# Patient Record
Sex: Female | Born: 1990 | Race: White | Hispanic: No | Smoking: Never smoker
Health system: Southern US, Community
[De-identification: ages and names within clinical notes are randomized; demographics above are authoritative.]

## PROBLEM LIST (undated history)

## (undated) ENCOUNTER — Inpatient Hospital Stay (HOSPITAL_COMMUNITY): Payer: Self-pay

## (undated) DIAGNOSIS — T8859XA Other complications of anesthesia, initial encounter: Secondary | ICD-10-CM

## (undated) DIAGNOSIS — D649 Anemia, unspecified: Secondary | ICD-10-CM

## (undated) DIAGNOSIS — A539 Syphilis, unspecified: Secondary | ICD-10-CM

## (undated) DIAGNOSIS — K219 Gastro-esophageal reflux disease without esophagitis: Secondary | ICD-10-CM

## (undated) DIAGNOSIS — J189 Pneumonia, unspecified organism: Secondary | ICD-10-CM

## (undated) DIAGNOSIS — N809 Endometriosis, unspecified: Secondary | ICD-10-CM

## (undated) DIAGNOSIS — M419 Scoliosis, unspecified: Secondary | ICD-10-CM

## (undated) DIAGNOSIS — R87629 Unspecified abnormal cytological findings in specimens from vagina: Secondary | ICD-10-CM

## (undated) DIAGNOSIS — G8929 Other chronic pain: Secondary | ICD-10-CM

## (undated) DIAGNOSIS — B999 Unspecified infectious disease: Secondary | ICD-10-CM

## (undated) DIAGNOSIS — R51 Headache: Secondary | ICD-10-CM

## (undated) DIAGNOSIS — F419 Anxiety disorder, unspecified: Secondary | ICD-10-CM

## (undated) DIAGNOSIS — T4145XA Adverse effect of unspecified anesthetic, initial encounter: Secondary | ICD-10-CM

## (undated) DIAGNOSIS — F329 Major depressive disorder, single episode, unspecified: Secondary | ICD-10-CM

## (undated) DIAGNOSIS — T7421XA Adult sexual abuse, confirmed, initial encounter: Secondary | ICD-10-CM

## (undated) DIAGNOSIS — N739 Female pelvic inflammatory disease, unspecified: Secondary | ICD-10-CM

## (undated) DIAGNOSIS — O139 Gestational [pregnancy-induced] hypertension without significant proteinuria, unspecified trimester: Secondary | ICD-10-CM

## (undated) DIAGNOSIS — J45909 Unspecified asthma, uncomplicated: Secondary | ICD-10-CM

## (undated) DIAGNOSIS — N83209 Unspecified ovarian cyst, unspecified side: Secondary | ICD-10-CM

## (undated) DIAGNOSIS — A64 Unspecified sexually transmitted disease: Secondary | ICD-10-CM

## (undated) DIAGNOSIS — N12 Tubulo-interstitial nephritis, not specified as acute or chronic: Secondary | ICD-10-CM

## (undated) DIAGNOSIS — N189 Chronic kidney disease, unspecified: Secondary | ICD-10-CM

## (undated) DIAGNOSIS — Z889 Allergy status to unspecified drugs, medicaments and biological substances status: Secondary | ICD-10-CM

## (undated) DIAGNOSIS — A749 Chlamydial infection, unspecified: Secondary | ICD-10-CM

## (undated) DIAGNOSIS — G43909 Migraine, unspecified, not intractable, without status migrainosus: Secondary | ICD-10-CM

## (undated) DIAGNOSIS — F32A Depression, unspecified: Secondary | ICD-10-CM

## (undated) DIAGNOSIS — M549 Dorsalgia, unspecified: Secondary | ICD-10-CM

## (undated) DIAGNOSIS — E039 Hypothyroidism, unspecified: Secondary | ICD-10-CM

## (undated) DIAGNOSIS — F319 Bipolar disorder, unspecified: Secondary | ICD-10-CM

## (undated) DIAGNOSIS — I10 Essential (primary) hypertension: Secondary | ICD-10-CM

## (undated) DIAGNOSIS — R599 Enlarged lymph nodes, unspecified: Secondary | ICD-10-CM

## (undated) DIAGNOSIS — N3010 Interstitial cystitis (chronic) without hematuria: Secondary | ICD-10-CM

## (undated) HISTORY — DX: Adult sexual abuse, confirmed, initial encounter: T74.21XA

## (undated) HISTORY — DX: Allergy status to unspecified drugs, medicaments and biological substances: Z88.9

## (undated) HISTORY — DX: Unspecified asthma, uncomplicated: J45.909

## (undated) HISTORY — DX: Syphilis, unspecified: A53.9

## (undated) HISTORY — DX: Major depressive disorder, single episode, unspecified: F32.9

## (undated) HISTORY — DX: Adverse effect of unspecified anesthetic, initial encounter: T41.45XA

## (undated) HISTORY — PX: WISDOM TOOTH EXTRACTION: SHX21

## (undated) HISTORY — PX: COLPOSCOPY: SHX161

## (undated) HISTORY — DX: Essential (primary) hypertension: I10

## (undated) HISTORY — DX: Gastro-esophageal reflux disease without esophagitis: K21.9

## (undated) HISTORY — DX: Headache: R51

## (undated) HISTORY — DX: Chlamydial infection, unspecified: A74.9

## (undated) HISTORY — PX: TONSILECTOMY, ADENOIDECTOMY, BILATERAL MYRINGOTOMY AND TUBES: SHX2538

## (undated) HISTORY — DX: Unspecified sexually transmitted disease: A64

## (undated) HISTORY — PX: CRYOTHERAPY: SHX1416

## (undated) HISTORY — DX: Hypothyroidism, unspecified: E03.9

## (undated) HISTORY — DX: Gestational (pregnancy-induced) hypertension without significant proteinuria, unspecified trimester: O13.9

## (undated) HISTORY — DX: Anemia, unspecified: D64.9

## (undated) HISTORY — DX: Scoliosis, unspecified: M41.9

## (undated) HISTORY — DX: Depression, unspecified: F32.A

## (undated) HISTORY — PX: OTHER SURGICAL HISTORY: SHX169

## (undated) HISTORY — DX: Migraine, unspecified, not intractable, without status migrainosus: G43.909

## (undated) HISTORY — DX: Enlarged lymph nodes, unspecified: R59.9

## (undated) HISTORY — DX: Pneumonia, unspecified organism: J18.9

## (undated) HISTORY — DX: Female pelvic inflammatory disease, unspecified: N73.9

## (undated) HISTORY — PX: FRENULECTOMY, LINGUAL: SHX1681

## (undated) HISTORY — DX: Other complications of anesthesia, initial encounter: T88.59XA

---
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
## 2011-01-03 ENCOUNTER — Ambulatory Visit: Admit: 2011-01-03 | Payer: Self-pay | Admitting: Psychologist

## 2011-01-05 ENCOUNTER — Encounter (INDEPENDENT_AMBULATORY_CARE_PROVIDER_SITE_OTHER): Payer: Commercial Managed Care - PPO | Admitting: Psychiatry

## 2011-01-05 DIAGNOSIS — F909 Attention-deficit hyperactivity disorder, unspecified type: Secondary | ICD-10-CM

## 2011-01-05 DIAGNOSIS — F319 Bipolar disorder, unspecified: Secondary | ICD-10-CM

## 2011-01-17 ENCOUNTER — Ambulatory Visit: Payer: Self-pay | Admitting: Psychologist

## 2011-01-31 ENCOUNTER — Ambulatory Visit (INDEPENDENT_AMBULATORY_CARE_PROVIDER_SITE_OTHER): Payer: Commercial Managed Care - PPO | Admitting: Psychologist

## 2011-01-31 DIAGNOSIS — F3110 Bipolar disorder, current episode manic without psychotic features, unspecified: Secondary | ICD-10-CM

## 2011-02-13 LAB — URINALYSIS, ROUTINE W REFLEX MICROSCOPIC
Bilirubin Urine: NEGATIVE
Glucose, UA: NEGATIVE mg/dL
Ketones, ur: NEGATIVE mg/dL
Nitrite: NEGATIVE
Protein, ur: 100 mg/dL — AB
Specific Gravity, Urine: 1.009 (ref 1.005–1.030)
Urobilinogen, UA: 0.2 mg/dL (ref 0.0–1.0)
pH: 6 (ref 5.0–8.0)

## 2011-02-13 LAB — URINE CULTURE
Colony Count: 6000
Culture  Setup Time: 201112290644

## 2011-02-13 LAB — URINE MICROSCOPIC-ADD ON

## 2011-02-13 LAB — PREGNANCY, URINE: Preg Test, Ur: NEGATIVE

## 2011-02-14 ENCOUNTER — Ambulatory Visit (INDEPENDENT_AMBULATORY_CARE_PROVIDER_SITE_OTHER): Payer: Commercial Managed Care - PPO | Admitting: Psychologist

## 2011-02-14 ENCOUNTER — Ambulatory Visit: Payer: Self-pay | Admitting: Psychologist

## 2011-02-14 DIAGNOSIS — F3110 Bipolar disorder, current episode manic without psychotic features, unspecified: Secondary | ICD-10-CM

## 2011-02-28 ENCOUNTER — Ambulatory Visit (INDEPENDENT_AMBULATORY_CARE_PROVIDER_SITE_OTHER): Payer: Commercial Managed Care - PPO | Admitting: Psychologist

## 2011-02-28 DIAGNOSIS — F3110 Bipolar disorder, current episode manic without psychotic features, unspecified: Secondary | ICD-10-CM

## 2011-03-11 LAB — POCT URINALYSIS DIP (DEVICE)
Bilirubin Urine: NEGATIVE
Glucose, UA: NEGATIVE mg/dL
Hgb urine dipstick: NEGATIVE
Ketones, ur: NEGATIVE mg/dL
Nitrite: NEGATIVE
Protein, ur: NEGATIVE mg/dL
Specific Gravity, Urine: >=1.03 (ref 1.005–1.030)
Urobilinogen, UA: 0.2 mg/dL (ref 0.0–1.0)
pH: 6 (ref 5.0–8.0)

## 2011-03-11 LAB — WET PREP, GENITAL
Clue Cells Wet Prep HPF POC: NONE SEEN
Trich, Wet Prep: NONE SEEN
WBC, Wet Prep HPF POC: NONE SEEN
Yeast Wet Prep HPF POC: NONE SEEN

## 2011-03-11 LAB — GC/CHLAMYDIA PROBE AMP, GENITAL
Chlamydia, DNA Probe: NEGATIVE
GC Probe Amp, Genital: NEGATIVE

## 2011-03-11 LAB — POCT PREGNANCY, URINE: Preg Test, Ur: NEGATIVE

## 2011-03-12 LAB — POCT PREGNANCY, URINE: Preg Test, Ur: NEGATIVE

## 2011-03-28 ENCOUNTER — Ambulatory Visit: Payer: Self-pay | Admitting: Psychologist

## 2011-04-04 ENCOUNTER — Ambulatory Visit (INDEPENDENT_AMBULATORY_CARE_PROVIDER_SITE_OTHER): Payer: Commercial Managed Care - PPO | Admitting: Psychologist

## 2011-04-04 DIAGNOSIS — F3110 Bipolar disorder, current episode manic without psychotic features, unspecified: Secondary | ICD-10-CM

## 2011-04-10 ENCOUNTER — Encounter (INDEPENDENT_AMBULATORY_CARE_PROVIDER_SITE_OTHER): Payer: Commercial Managed Care - PPO | Admitting: Psychiatry

## 2011-04-10 DIAGNOSIS — F319 Bipolar disorder, unspecified: Secondary | ICD-10-CM

## 2011-04-11 ENCOUNTER — Ambulatory Visit: Payer: Self-pay | Admitting: Psychologist

## 2011-04-17 ENCOUNTER — Encounter (HOSPITAL_COMMUNITY): Payer: Commercial Managed Care - PPO | Admitting: Psychiatry

## 2011-04-18 NOTE — Op Note (Signed)
NAME:  Whitney, Gonzalez NO.:  [id number]   MEDICAL RECORD NO.:  [id number]           PATIENT TYPE:   LOCATION:                                 FACILITY:   PHYSICIAN:  Kristine Garbe. Ezzard Standing, M.D.DATE OF BIRTH:  Aug 04, 1991   DATE OF PROCEDURE:  DATE OF DISCHARGE:                               OPERATIVE REPORT   PREOPERATIVE DIAGNOSES:  1. Tonsillar hypertrophy with history of recurrent tonsillitis.  2. Turbinate hypertrophy with nasal obstruction.   POSTOPERATIVE DIAGNOSES:  1. Tonsillar hypertrophy with history of recurrent tonsillitis.  2. Turbinate hypertrophy with nasal obstruction.   OPERATION:  Tonsillectomy and adenoidectomy.  Simple bilateral inferior  turbinate reductions.   SURGEON:  Kristine Garbe. Ezzard Standing, M.D.   ANESTHESIA:  General endotracheal.   COMPLICATIONS:  None.   DESCRIPTION OF PROCEDURE:  After adequate endotracheal anesthesia, the  patient received 1 g Ancef IV preoperatively, as well as, 10 mg  Decadron.  A mouth gag was used to expose the oropharynx.  The left and  right tonsils were then resected from the tonsillar fossa using a  cautery.  Care was taken to preserve the anterior and posterior  tonsillar pillars, as well as, the uvula.  Hemostasis was obtained with  the cautery.  After obtaining adequate hemostasis, a  rubber catheter  was passed through the nose and out of the mouth to retract the soft  palate and nasopharynx was examined.  Whitney Gonzalez had just minimal adenoid  tissue and suction cautery was used to cauterize the minimal central pad  of adenoid tissue.  Whitney Gonzalez was then turned, nose was examined.  She  had large swollen inferior turbinates.  Whitney Gonzalez bipolar cauterization was  performed.  The Whitney Gonzalez probes were placed submucosally to the back of the  turbinate and submucosal cauterization was performed with Whitney Gonzalez turned  to 16.  This completed procedure.  There was minimal bleeding.  Nose and  nasopharynx were irrigated  with saline.  Whitney Gonzalez was awoke from  anesthesia and transferred to recovery room.  Postop, doing well.   DISPOSITION:  Whitney Gonzalez was discharged home by this morning on Zithromax  suspension 200 mg daily for 6 days along with Tylenol, Lortab Elixir 10-  20 mL q.4 h. p.r.n. pain.  Further followup is in office in 2 weeks for  recheck.           ______________________________  Kristine Garbe. Ezzard Standing, M.D.     CEN/MEDQ  D:  03/12/2008  T:  03/13/2008  Job:  [id number]

## 2011-04-21 NOTE — H&P (Signed)
NAME:  Whitney Gonzalez, Whitney Gonzalez NO.:  [id number]   MEDICAL RECORD NO.:  [id number]          PATIENT TYPE:  INP   LOCATION:  0602                          FACILITY:  [facility]   PHYSICIAN:  Jasmine Pang, M.D. DATE OF BIRTH:  04-04-1991   DATE OF ADMISSION:  10/16/2004  DATE OF DISCHARGE:                         PSYCHIATRIC ADMISSION ASSESSMENT   IDENTIFYING DATA:  The patient is a 20 year old Caucasian female who is a  patient of Dr. Lubertha Basque.  She is currently in the seventh grade at [employer].  She is having to be home-bound due to a dislocated shoulder.   HISTORY OF PRESENT ILLNESS:  The patient has a history of bipolar disorder  and ADHD.  She was admitted after telling her mother that she did not want  to live.  She stated that she had a plan to stab herself.  Mother states she  has not made a suicide plan before.  The patient elaborated that she feels  lonely and just doesn't feel I have a reason to live.  Initially there,  mom felt there was no identifiable precipitant related to this suicidal  ideation.  However, in talking with the patient, she feels some arguments  with her sister and father may have preceded this ideation.   PAST PSYCHIATRIC HISTORY:  The patient was hospitalized at [facility] December 27, 2003 to January 05, 2004.  Dr. Marlyne Beards  was her doctor.  Her outpatient therapist is Dr. Jolene Provost.  Outpatient  psychiatrist is Dr. Carolanne Grumbling at [facility].   SUBSTANCE ABUSE HISTORY:  None.   PAST MEDICAL HISTORY:  The patient has a history of acid reflux, asthma and  a recent dislocation of her right shoulder.   ALLERGIES:  TEGRETOL, CODEINE, BENADRYL, LANOLIN.   CURRENT MEDICATIONS:  Abilify 10 mg q.h.s., Depakote ER 1250 mg q.h.s.,  Risperdal 0.5 mg p.o. b.i.d. and 1 mg q.h.s., Zoloft 50 mg p.o. q.h.s.,  Nexium 40 mg q.a.m., Singulair 10 mg q.h.s., Naprosyn 375 mg p.o. b.i.d.,  Dexedrine ER 10 mg q.a.m., melatonin 4-6 mg q.h.s.   FAMILY/SOCIAL HISTORY:  Positive bipolar disorder in sister and father.  Paternal grandmother has panic attacks and depression.  Great-grandmother  and great-grandfather had alcohol dependence.  Mother reports no early  developmental delays in the patient.  As indicated above, she is in the  seventh grade at [employer] but doing home-school for the past  month because of her dislocated shoulder.  She states that she is unhappy  about not being at school.  Mother reports that she is very intelligent.   MENTAL STATUS EXAM:  The patient was a healthy, well-developed, well-  nourished, Caucasian female who was friendly and cooperative with exam.  Psychomotor activity was within normal limits.  Speech was normal rate and  slow.  Eye contact good.  Mood depressed.  Affect constricted.  There was  positive suicidal ideation as per history of present illness.  She does deny  she feels that way at this time.  No homicidal ideation.  No psychosis  including auditory or visual hallucinations.  Thoughts are logical and goal  directed.  Cognitive exam grossly within normal limits for age.   ASSETS/STRENGTHS:  Healthy and supportive parents.   INITIAL TREATMENT PLAN:  We will evaluate for need for change of medications  and staff support to help with the depression and suicidal ideation.   ADMISSION DIAGNOSES:   AXIS I:  1.  Bipolar disorder, depressed, severe.  2.  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   AXIS II:  Deferred.   AXIS III:  1.  Asthma.  2.  Gastroesophageal reflux disease.  3.  History of dislocated right shoulder.   AXIS IV:  Moderate (family issues and being out of school).   AXIS V:  Global Assessment of Functioning now 30; Global Assessment of  Functioning highest past year 47.   INITIAL FORMULATION:  The patient is a 20 year old female who has had a mood  dysregulation and ADHD.   She is genetically at higher than average risk for  the development of the mood disorder, given the positive family history.  In  addition, she has been struggling with pain from her dislocated right  shoulder.  Compounding these biologic contributions to her psychiatric  disorder are several psychosocial stressors.  These include conflict,  particularly with her father and her sister, and being out of school due to  the shoulder dislocation.   INITIAL PLAN OF CARE:  Continue current medications.  Will get an a.m.  Depakote level and do further evaluation of need for change.  Involvement in  unit therapeutic groups and activities.  Family therapy.  Target symptoms  include improvement in mood and resolution of suicidal ideation.     Barb   BHS/MEDQ  D:  10/16/2004  T:  10/16/2004  Job:  [id number]
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
## 2011-04-21 NOTE — Discharge Summary (Signed)
NAME:  Whitney Gonzalez, CHINN NO.:  [id number]   MEDICAL RECORD NO.:  [id number]          PATIENT TYPE:  INP   LOCATION:  0600                          FACILITY:  [facility]   PHYSICIAN:  Beverly Milch, MD     DATE OF BIRTH:  [birth year]   DATE OF ADMISSION:  11/09/2004  DATE OF DISCHARGE:  11/14/2004                                 DISCHARGE SUMMARY   IDENTIFICATION:  This 20 year old female, seventh grade student at [employer], was admitted emergently voluntarily on referral from Dr.  Carolanne Grumbling, her outpatient psychiatrist, for inpatient stabilization of  suicide and homicide threats in the setting of apparent exacerbation of  bipolar disorder.  The patient had been hospitalized less than one month ago  for exacerbation of depression and similar circumstances.  The patient has  been asked by her church youth group to not attend any longer until she is  less disruptive and more appropriate in her behavior.  Several peers at  school apparently signed statements that patient had threatened them or  herself.  The patient is attempting to become more socially involved,  including in the family and in school, though she has marginal success at  times.  She is not accepting of older sister's transformation from being  traumatic to her in the past from older sister's bipolar disorder to now  being loving and supportive.  The patient is disappointed in father and his  mood swings, while mother is preoccupied with her own medical problems and  family problems at large.  Patient makes suicide and homicide threats  apparently as interpersonal interactions become more strained or  consequential.  For full details, please see the typed admission assessment.   SYNOPSIS OF THE PRESENT ILLNESS:  The patient has acquired bipolar disorder  both in learned behavior as well as genetically in the family.  She has a  long standing treatment for ADHD as well.  The family becomes  overdetermined  in the expectations and emphasis upon medications for the children while  mother is disappointed in the marginal medication treatment that father  pursues.  Family is highly sensitized about medication adjustments or  failure to adjust medications.  Mother currently disagrees with Dr. Lubertha Basque  plan to change Risperdal to Zyprexa as she states that the older sister had  problems with Zyprexa.  We failed a trial of Wellbutrin, lasting only  several days with the patient, in the past as the family was alienated to  the medication.  They predominantly want restructuring of the dosing of  current medications.  The patient is gradually maturing so that she is no  longer fixated on fantasies or resistance to psychosexual maturation.  She  is eating adequately.  She is less depressed, but her emotions and behavior  become chaotic and disruptive.  She does not acknowledge such readily, but  exhibits manic or hypomanic denial.  She becomes irritably hostile in the  initial part of the day with difficulty arising.  They state at times she  has difficulty initiating sleep.  They have only found melatonin to be  acceptable in small doses  for getting to sleep at night, but then attribute  being sleepy the next morning and resistant to arising to the melatonin.  She is not having psychotic symptoms at this time.  Her weight had been 79  pounds in January of 2005, becoming 86 pounds in November of 2005.  Her  Depakote was increased from 1250 to 1500 during her November 2005  hospitalization and Zoloft was increased from 50 to 75 mg daily in divided  doses, though mother has resumed a single nightly dose, feeling that this is  more acceptable for sleep patterns.  The patient is less somatoform during  this hospitalization.  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  She  takes Nexium, Singulair and melatonin in addition to having restarted  Gabitril for anxiety, taking Dexedrine, Risperdal, Zoloft, Abilify and  Depakote.   INITIAL MENTAL STATUS EXAM:  The patient is much less depressed, though  still with brief moments of severe dysphoria and significant anxiety.  Mother feels the patient has much more anxiety than we can observe at the  hospital.  The patient denies many of her threats of homicide and suicide  and denies any pervasive intent or expressed need for such.  The family sees  more mood instability and rapid cycling.  The family equates excitement or  agitation with a manic episode.  However, they perceive that the patient has  core dysphoria relative to relationships, both within the family and at  school.   LABORATORY FINDINGS:  CBC on admission was mildly abnormal with MCV elevated  at 93.6, up from most recent value in November of 91.8 with upper limit of  normal 92.  RBC count was up to 3.77 million from 3.55 million with lower  limit of normal 3.8.  Platelet count was 186,000 down from last month's  272,000 with lower limit of normal 190,000.  Lymphocytosis of 62% with 12%  monocytes resulted in an absolute neutrophil count of 1,200 for a normal  white blood count total of 5,900.  Hemoglobin was normal at 12.  Comprehensive metabolic panel was normal, though alkaline phosphatase was  283, normal for an adolescent, but elevated for adult norms of 39 to 117.  Albumin was low at 3.3 with lower limit of normal 3.5.  Sodium was normal at  135, potassium 4.7, fasting glucose 82, creatinine 0.6, calcium 9.3, AST 31  and ALT 15.  Depakote level was 107.8 mcg/mL at nine hours after dose.   HOSPITAL COURSE AND TREATMENT:  General medical exam by Vic Ripper,  P.A.-C. noted that the patient has full range of motion of the right  shoulder and no longer requires her immobilizer.  The patient has a  benign nevus on the abdomen.  She does wear glasses.  She remains peripubertal with  no menarche yet and is Tanner stage II on exam.   Height was 57 inches with weight of 85 pounds with blood pressure on  admission 108/70 with heart rate of 94 sitting and 115/76 with heart rate of  98 standing.  At the time of discharge, supine blood pressure was 96/60 with  heart rate of 74 and standing blood pressure 103/72 with heart rate of 89.  Vital signs were normal throughout hospital stay.   After family conference with mother, Abilify was increased to 15 mg nightly  and Risperdal was decreased by  0.5 mg to 0.5 mg three times daily.  Zoloft  was reduced from 75 to 50 mg daily and divided into 12.5 mg in the morning  and 37.5 mg in the evening.  Other medications were maintained, though the  patient did not require melatonin at all during the hospital stay, though  she had one night difficult for accomplishing sleep in the early part of the  hospital stay.  She was using some hydrocortisone cream on her nostrils  where she has some low grade bacterial rhinitis and chapping, evident even  from her hospitalization the preceding month.  She was switched to Bactroban  ointment and noted significant clearing.   The patient re-worked the capacity for social relatedness and communication.  She addressed becoming realistically focused on social skills and problem  solving instead of using denial that interferes with successful change.  She  worked effectively in family therapy by the time of discharge including both  parents and Dr. Ladona Ridgel in the final family therapy session.  They reached  the conclusion that the patient desires more relatedness to father and  father must consider himself unsuccessful in securing such, though reasons  were clarified.  The patient is pleased with some changes made by family  members thus far.  We addressed ways for the patient to resolve her  conflicts with a peer at  school as well as to become accepting of sister's  relative nurturing now.  Mother addressed her own sense of being rejected in  her nuclear family by her own mother.  Mother is concerned that her current  marriage to father is severely stressed.  The patient was more realistic  about her threats at the time of discharge, addressing consequences and ways  to change.  She addressed anger for the maternal grandmother rejecting the  family and not allowing them to come for Christmas and was able to dissipate  this anger without other consequences.  The patient was discharged in  improved condition.  She required no seclusion, restraint or equivalent of  such during the hospital stay, as documented at the request of nursing  administration.  The patient was started on multivitamin with folic acid, selenium and zinc  relative to the rising MCV and Depakote.   FINAL DIAGNOSES:   AXIS I:  1.  Bipolar disorder, mixed, severe.  2.  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  3.  Impulse control disorder, not otherwise specified.  4.  Generalized anxiety disorder with limited symptom panic.  5.  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  6.  Parent-child problem.  7.  Other interpersonal problem.  8.  Other specified family circumstances.   AXIS II:  Diagnosis deferred.   AXIS III:  1.  Allergic rhinitis and asthma.  2.  Bacterial rhinitis, both nares.  3.  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  4.  Myopia requiring eyeglasses.  5.  Nocturnal enuresis twice in the last three weeks, likely associated with      polypharmacy.  6.  Gastroesophageal reflux disorder.  7.  Borderline thrombocytopenia with platelet count 186,000.  8.  Borderline macrocytosis with MCV of 93.6.   AXIS IV:   Stressors:  Family - severe to extreme, acute and chronic; school  - moderate, acute and chronic; phase of  life - moderate, acute and chronic.   AXIS V:  Global Assessment of Functioning on admission was 40 with highest  in the last year of 67 and discharge Global Assessment of Functioning was  55.   PLAN:  The patient was discharged on the following medications:  1.  Dexedrine 10 mg SR capsule every morning, quantity No. 30 with no refill      prescribed.  2.  Zoloft 25 mg tablet to take one half tablet every morning and one and a      half tablets every bedtime, quantity No. 60 with no refill prescribed.  3.  Risperdal 0.5 mg tablet three times daily at morning, 4:00 p.m. and      bedtime, quantity No. 90 with no refill prescribed.  4.  Gabitril 4 mg tablet three times daily in the morning, 4:00 p.m. and      bedtime, quantity No. 90 with no refill prescribed.  5.  Depakote 500 mg ER tablet to take three every bedtime, quantity No. 90      with no refill prescribed.  6.  Abilify 15 mg tablet every bedtime, quantity No. 30 with no refill      prescribed.  7.  Melatonin 200 mcg tablet taking one or two as needed at bedtime, own      home over the counter supply.  8.  Nexium 40 mg every morning, own home supply.  9.  Singulair 10 mg every bedtime, own home supply.  10. Vitamin with folic acid, selenium and zinc one daily, over the counter,      while taking Depakote.  11. Bactroban ointment is sent home with the patient to apply to the      nostrils three times daily until healed.   She follows a regular diet and has no restrictions on physical activity.  Crisis and safety plans are outlined if needed.  She will see Dr. Ladona Ridgel,  November 23, 2004 at 1300.  She will see Dr. Jolene Provost for therapy,  November 15, 2004 at 0900 and November 18, 2004 at 1500.     Glen   GJ/MEDQ  D:  11/16/2004  T:  11/16/2004  Job:  [id number]   cc:   Carolanne Grumbling, M.D.   ATTN:  [facility]  [facility]  [address]  [place], [place] [address]   Juan Quam, M.D.  [address]  [place]  [place] [address]  Fax: [phone]
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
## 2011-04-21 NOTE — Consult Note (Signed)
NAME:  VERNER, KOPISCHKE NO.:  [id number]   MEDICAL RECORD NO.:  [id number]          PATIENT TYPE:  EMS   LOCATION:  MINO                         FACILITY:  [facility]   PHYSICIAN:  Doralee Albino. Carola Frost, M.D. DATE OF BIRTH:  [birth year]   DATE OF CONSULTATION:  10/15/2004  DATE OF DISCHARGE:  10/16/2004                                   CONSULTATION   REASON FOR CONSULTATION:  Subjective history of left elbow dislocation with  associated paralysis and paraesthesia of the left forearm and hand.   HISTORY OF PRESENT ILLNESS:  Jae Dire is a 20 year old white female known to our  office for recent right shoulder dislocation and also a long history of  musculoskeletal reported injuries that have been followed and treated by Dr.  Lunette Stands.  She now complains that after her sister sat on her arm that  she lost the ability to move her wrist and hand and had the onset of  numbness as well as inability to range the elbow.  The mother contacted [place]  and requested expedition of management in the emergency room so that she  could undergo emergent reduction of her elbow.  She does have a history of  other dislocations.   We did contact the emergency room to facilitate this and saw her upon her  arrival to the pediatric area of the emergency room.   On examination, she was wearing a mask and had a right upper extremity and a  shoulder immobilizer.  Her upper extremity was in a make-shift sling and  swath.  This was removed and thorough examination performed.   PHYSICAL EXAMINATION:  EXTREMITIES:  The dorsalis pedis pulses is 2+.  Radial knee and ulnar sensory function was absent according to the patient.  She also denied any sensation of forearm beginning at the elbow crease and  going distally in an anatomic distribution.  She stated she was unable to  perform extension of the thumb, wrist, or any of her digits.  She was unable  to flex or make a fist of her digits and unable to flex the  wrist as well.  Range of motion testing revealed full of the wrist and full range of motion  of the elbow with 0 to 170 degrees being obtained.  She had full pronation  and supination.  There was no instability noted.  There was no swelling and  no ecchymosis and no block of the elbow and specifically no blocking motion  of any kind.  When distracted, some digital motion could be visualized  including extension of the thumb and flexion of the fingers.   LABORATORY DATA:  X-rays:  Four views were obtained of the left elbow which  demonstrated a concentric elbow with no evidence of fracture, malalignment  or soft tissue swelling of any kind.   IMPRESSION:  After the patient returned to the emergency room area from  radiology, the mother stated she was able to move her fingers and wrists  during x-ray examination.  She was then able to perform full flexion and  extension of the wrist actively, full extension and flexion of the  thumb,  full extension and flexion of the fingers making a solid fist.  She also  abducted and adducted her digits as well.  She raised her hand with the  elbow fully extended above her head as well.  This demonstration coupled  with the examination 15 minutes earlier points to a nonorthopedic etiology  for her left elbow complaints.  These etiologies such as conversion  disorder, psychosomatic illness, and secondary gain are best evaluated by  someone other than an orthopedic surgeon.  We will discuss these findings  with Dr. Lunette Stands to help her better treat this patient.   Because there was no evidence of injury, we did not prescribe a sling to  her, and during discussion of that possibility, the mother indicates that  she was not interested in that either because for the need for  immobilization of  the right upper extremity.  We did not alter her plans for follow up which  is already scheduled.  Because of the late hour, hour after midnight as well  as the  emergency room setting, we also did not delve more deeply how to best  help this patient other than to state simply that her left elbow did not  require any orthopedic intervention.     [place]   MHH/MEDQ  D:  10/16/2004  T:  10/16/2004  Job:  [id number]
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
## 2011-04-21 NOTE — Discharge Summary (Signed)
NAME:  MILEA, KLINK NO.:  [id number]   MEDICAL RECORD NO.:  [id number]          PATIENT TYPE:  INP   LOCATION:  0604                          FACILITY:  [facility]   PHYSICIAN:  Lalla Brothers, MDDATE OF BIRTH:  30-Sep-1991   DATE OF ADMISSION:  10/19/2005  DATE OF DISCHARGE:  10/24/2005                                 DISCHARGE SUMMARY   IDENTIFICATION:  This 20 year old female, eighth grade student at [employer], was admitted emergently voluntarily as brought  by mother to the [facility] for  inpatient stabilization and treatment of suicide and homicide risk. Mother  was emphatic about all the patient's symptoms and dysfunction at the time of  admission as though either reliving these or frustrated with them. The  patient reportedly was stealing and aggressive at home, kicking the dog and  destroying property. She had reportedly attempted to pierce her umbilicus.  She reported a 10-pound weight gain in the last month, likely indicating  peripubertal changes and was formulating specific plans to kill herself and  others though intent was vague. For full details, please see the typed  admission assessment.   SYNOPSIS OF PRESENT ILLNESS:  The patient planned to put her hand in a  blender and cut her arteries to bleed to death. She planned to stick pencils  in the eyes of her teachers at school to kill them. As she devalued family  especially older sister and father who were busy with other concerns and not  there for the patient as well as devaluing the school for expecting too  much, the patient was alienating the very sources of support she was  dependent on for stabilization. The patient reports sleeping in class.  Enuresis was apparently stable with DDAVP though she reports dependence on  multiple medications to function through the day. The patient was most  recently inpatient at the [facility] Apr 11, 2005 through Apr 15, 2005. She has no identified substance use. She is under the primary care  of Dr. Samuel Bouche with MCV remaining stable at 92.8 since last hospitalization.  She reports allergy to TEGRETOL, CODEINE, BENADRYL, PENICILLIN, AUGMENTIN,  LANOLIN and possibly certain antidepressants by history. Older sister and  father have bipolar disorder and mother has depression. Paternal grandmother  has bipolar disorder and maternal great-grandmother and great-grandfather  have substance abuse with alcohol.   INITIAL MENTAL STATUS EXAM:  The patient disengages responsibility for her  actions and looks around as though not sure what she has done or how or why.  The patient exhibits distortion and denial. Anxiety seems decreased from  last hospitalization and ADHD symptoms seem residual now. Her Zoloft has  been increased recently by Dr. Ladona Ridgel from 50 mg 100 mg nightly. The patient  seems to have more mood instability and chaotic outbursts of behavior and  morbid cognition. She is suicidal and homicidal in this way. She appears to  be at a hormonal juncture in her development with fully established  thelarche now and significant weight gain from 87 pounds five months ago to  106  pounds currently with one inch in height gain.   LABORATORY FINDINGS:  CBC was normal except MCV at 92.8 with upper limit of  normal 92 and MCHC 34.2 with upper limit of normal 34. She did have 13%  monocytes with upper limit of normal 9. White count was normal 4800,  hemoglobin 12.4 and platelet count 237,000. Comprehensive metabolic panel  was normal except alkaline phosphatase 275, suggesting prepubertal status  and total protein 5.9 with lower limit of normal 6. Sodium was normal at  141, potassium 4.4, CO2 29, fasting glucose 91, creatinine 0.7, calcium 9.3,  albumin 3.6, AST 24, ALT 12 and GGT 18. Free T4 was normal at 0.94 and TSH  at 2.357. Urine HCG was negative. Depakote level was 101  mcg/mL  approximately 10 hours after dose. Urine drug screen was negative except  positive for Dexedrine, taking 10 mg SR every morning with urine amphetamine  greater than 4000 and creatinine of 164 mg/dL. Urinalysis on admission was  normal with specific gravity 1.029-1.033 on two separate specimens;  otherwise negative. RPR was nonreactive.   HOSPITAL COURSE AND TREATMENT:  General medical exam by Jorje Guild, PA-C  noted a self-report of left ear pain and diminished hearing recently with no  abnormality evident on exam. The patient was talking about bilateral foot  fractures in the past, tongue surgery, apparently tight frenulum at birth,  and asthma and GERD. She does have eyeglasses. She is not sexualized or  sexually active in her behavior at this time. She suggests that her hearing  is better in the right ear than the left. Vital signs were normal throughout  hospital stay with height of 58 inches and weight of 106 pounds. Blood  pressure on admission was 111/71 with heart rate of 94. At the time of  discharge, supine blood pressure was 102/64 with heart rate of 74 and  standing blood pressure 105/70 with heart rate of 96. The patient's Zoloft  was reduced to 50 mg every bedtime from 100 mg dose. Dexedrine was  discontinued. The patient gradually engaged in all aspects of active  inpatient treatment including group, milieu, behavioral, individual, family,  special education, occupational and therapeutic recreational, anger  management and substance abuse prevention therapies. The patient preferred  to be on the adolescent unit program but was maintained on the child  psychiatric program. The patient initially seemed apparently fulfilled in  achieving hospitalization and care but, by the time of discharge, was crying  over having to complete the hospital program. She became more realistic with  the progression of the hospital course about her symptoms, their origin and her  responsibilities for working on her problems. She resisted such  responsibility up until October 22, 2005. She regressed again on the day  before discharge demanding herself that she be discharged instead of being  required to complete the programming. In the final family therapy session  with the patient and parents, the patient could practically apply what she  had learned to her need to return to [employer] as well as home. She  expressed genuine concern that mother might die from outpatient knee surgery  scheduled for that afternoon. Father reported to the patient that he was  improving in his mood and should be able to return to work soon. They  addressed older sister's regressive disengagement from family as she focuses  on outside relationships and her own symptoms. Dr. Jolene Provost did see the  patient on the hospital unit during  her hospital stay. The patient wishes to  work on a gradual return to [employer] for the future similar to  the way she wanted hospitalization on the adolescent unit. She required no  seclusion or restraint during the hospital stay.   FINAL DIAGNOSES:  AXIS I:  Bipolar disorder, mixed, severe.  Generalized  anxiety disorder.  Oppositional defiant disorder.  Attention-deficit  hyperactivity disorder, combined-type, residual phase.  Secondary nocturnal  enuresis.  Parent-child problem.  Other specified family circumstances.  Other interpersonal problem.  AXIS II:  Diagnosis deferred.  AXIS III:  Self-inflicted puncture wound of the periumbilical abdomen,  allergic rhinitis and asthma, gastroesophageal reflux disorder, migraine,  immediately peripubertal, borderline macrocytosis, myopia requiring glasses,  elevated total cholesterol of 181 in the past, multiple medication allergies  including TEGRETOL, CODEINE, BENADRYL, PENICILLIN, AUGMENTIN and LANOLIN as  well as possibly certain antidepressants.  AXIS IV:  Stressors:  Family--severe,  acute and chronic; school--severe,  acute and chronic; phase of life--severe, acute and chronic.  AXIS V: GAF on admission 40; highest in last year 67; discharge GAF 54.   CONDITION ON DISCHARGE:  The patient was discharged to parents in improved  condition free of suicidal ideation.   ACTIVITY/DIET:  She is discharged on a regular diet and has no restrictions  on physical activity. Crisis and safety plans are outlined if needed. The  patient was discharged on the following medications.   DISCHARGE MEDICATIONS:  1.  Risperdal 0.5 mg tablet, using 1 every morning and mid-afternoon and 2      at bedtime; quantity #120 with no refill prescribed.  2.  Gabitril 4 mg every morning, mid-afternoon and bedtime; quantity #90      with no refill prescribed.  3.  Abilify 15 mg tablet every bedtime; quantity #30 with no refill      prescribed.  4.  Zoloft 50 mg tablet every bedtime; quantity #30 with no refill      prescribed. 5.  Depakote 500 mg ER as 3 tablets or 1500 mg every bedtime; quantity #90      with no refill prescribed.  6.  Nexium 40 mg morning and bedtime; having her own home supply.  7.  Singulair 10 mg every bedtime; having her own home supply.  8.  DDAVP 0.2 mg every bedtime; own home supply.  9.  Melatonin 600 mcg at bedtime if needed for insomnia; having her own home      supply.  10. Atarax 25 mg to use 1 or 2 at bedtime if needed for insomnia; having her      own home supply.  11. Albuterol inhaler as needed; as per own home supply.   FOLLOW UP:  The patient will see Dr. Jolene Provost for therapy November 01, 2005 and 0800. She will see Dr. Carolanne Grumbling for psychiatric follow-up  November 07, 2005 at 0900. She returns to the [employer] for school  at this time.      Lalla Brothers, MD  Electronically Signed     GEJ/MEDQ  D:  10/28/2005  T:  10/28/2005  Job:  [id number]   cc:   [facility]  Developmental and Psychological  CTR  [address]  [place], [place] [address]   Carolanne Grumbling, M.D.   Juan Quam, M.D.  Fax: [phone]

## 2011-04-25 ENCOUNTER — Ambulatory Visit (INDEPENDENT_AMBULATORY_CARE_PROVIDER_SITE_OTHER): Payer: Commercial Managed Care - PPO | Admitting: Psychologist

## 2011-04-25 DIAGNOSIS — F3110 Bipolar disorder, current episode manic without psychotic features, unspecified: Secondary | ICD-10-CM

## 2011-05-09 ENCOUNTER — Ambulatory Visit: Payer: Commercial Managed Care - PPO | Admitting: Psychologist

## 2011-05-23 ENCOUNTER — Ambulatory Visit: Payer: Commercial Managed Care - PPO | Admitting: Psychologist

## 2011-06-20 ENCOUNTER — Ambulatory Visit (INDEPENDENT_AMBULATORY_CARE_PROVIDER_SITE_OTHER): Payer: 59 | Admitting: Psychologist

## 2011-06-20 DIAGNOSIS — F3110 Bipolar disorder, current episode manic without psychotic features, unspecified: Secondary | ICD-10-CM

## 2011-06-27 ENCOUNTER — Ambulatory Visit: Payer: Commercial Managed Care - PPO | Admitting: Psychologist

## 2011-07-04 ENCOUNTER — Ambulatory Visit: Payer: Commercial Managed Care - PPO | Admitting: Psychologist

## 2011-07-11 ENCOUNTER — Encounter (INDEPENDENT_AMBULATORY_CARE_PROVIDER_SITE_OTHER): Payer: 59 | Admitting: Psychiatry

## 2011-07-11 DIAGNOSIS — F319 Bipolar disorder, unspecified: Secondary | ICD-10-CM

## 2011-07-18 ENCOUNTER — Ambulatory Visit: Payer: Commercial Managed Care - PPO | Admitting: Psychologist

## 2011-08-01 ENCOUNTER — Ambulatory Visit: Payer: 59 | Admitting: Psychologist

## 2011-08-15 ENCOUNTER — Ambulatory Visit (INDEPENDENT_AMBULATORY_CARE_PROVIDER_SITE_OTHER): Payer: 59 | Admitting: Psychologist

## 2011-08-15 DIAGNOSIS — F3110 Bipolar disorder, current episode manic without psychotic features, unspecified: Secondary | ICD-10-CM

## 2011-08-29 ENCOUNTER — Ambulatory Visit: Payer: Commercial Managed Care - PPO | Admitting: Psychologist

## 2011-08-29 LAB — POCT HEMOGLOBIN-HEMACUE: Hemoglobin: 9.9 — ABNORMAL LOW

## 2011-08-29 LAB — INFLUENZA A AND B ANTIGEN (CONVERTED LAB)
Inflenza A Ag: NEGATIVE
Influenza B Ag: NEGATIVE

## 2011-09-04 LAB — POCT RAPID STREP A: Streptococcus, Group A Screen (Direct): NEGATIVE

## 2011-09-12 ENCOUNTER — Ambulatory Visit: Payer: 59 | Admitting: Psychologist

## 2011-09-12 DIAGNOSIS — F3110 Bipolar disorder, current episode manic without psychotic features, unspecified: Secondary | ICD-10-CM

## 2011-09-12 LAB — PREGNANCY, URINE: Preg Test, Ur: NEGATIVE

## 2011-09-12 LAB — URINALYSIS, ROUTINE W REFLEX MICROSCOPIC
Ketones, ur: NEGATIVE
Nitrite: NEGATIVE
Protein, ur: NEGATIVE
Urobilinogen, UA: 0.2

## 2011-09-12 LAB — CULTURE, BLOOD (ROUTINE X 2)

## 2011-09-12 LAB — CBC
Hemoglobin: 13.2
MCHC: 33.6
Platelets: 235
RDW: 12.7

## 2011-09-12 LAB — COMPREHENSIVE METABOLIC PANEL
ALT: 15
Albumin: 3.9
Alkaline Phosphatase: 94
Calcium: 8.8
Glucose, Bld: 110 — ABNORMAL HIGH
Potassium: 3.4 — ABNORMAL LOW
Sodium: 135
Total Protein: 6.9

## 2011-09-12 LAB — RAPID STREP SCREEN (MED CTR MEBANE ONLY): Streptococcus, Group A Screen (Direct): NEGATIVE

## 2011-09-12 LAB — DIFFERENTIAL
Eosinophils Absolute: 0 — ABNORMAL LOW
Lymphs Abs: 0.7 — ABNORMAL LOW
Monocytes Absolute: 0.5
Monocytes Relative: 10
Neutro Abs: 3.4
Neutrophils Relative %: 74 — ABNORMAL HIGH

## 2011-09-12 LAB — RAPID URINE DRUG SCREEN, HOSP PERFORMED: Tetrahydrocannabinol: NOT DETECTED

## 2011-09-19 ENCOUNTER — Encounter (HOSPITAL_COMMUNITY): Payer: Self-pay

## 2011-09-26 ENCOUNTER — Ambulatory Visit: Payer: Commercial Managed Care - PPO | Admitting: Psychologist

## 2011-10-10 ENCOUNTER — Ambulatory Visit: Payer: 59 | Admitting: Psychologist

## 2011-10-23 ENCOUNTER — Ambulatory Visit (INDEPENDENT_AMBULATORY_CARE_PROVIDER_SITE_OTHER): Payer: 59 | Admitting: Psychiatry

## 2011-10-23 VITALS — BP 122/80 | Ht 62.5 in | Wt 120.0 lb

## 2011-10-23 DIAGNOSIS — F909 Attention-deficit hyperactivity disorder, unspecified type: Secondary | ICD-10-CM

## 2011-10-23 DIAGNOSIS — F3160 Bipolar disorder, current episode mixed, unspecified: Secondary | ICD-10-CM

## 2011-10-23 MED ORDER — LAMOTRIGINE 150 MG PO TABS: 150.0000 mg | ORAL_TABLET | Freq: Every day | ORAL | Status: DC

## 2011-10-23 NOTE — Progress Notes (Signed)
   [facility] Behavioral Health Follow-up Outpatient Visit  DINA WARBINGTON 05-Jul-1991   Subjective: The patient is a 20 year old female who has been followed by [facility] since September 2002. I have been treating her since August 2008. 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  The only medication she currently takes is Lamictal 100 mg daily. The patient and sister who I also treat come today for appointments. The patient reports that she's doing well. She will be starting to [employer] in the spring. She passed all her placement test except for math. She will take to math classes the spring. She is dating the same boy she was dating at the last appointment. She and her sister are doing much better. The patient's mom took into homeless teenagers. The patient feels more stress at home. She is asking to go up on her Lamictal to help deal with this. The patient endorses good sleep and appetite. She has had increased irritability.  Filed Vitals:   10/23/11 1014  BP: 122/80    Mental Status Examination  Appearance: Casually dressed Alert: Yes Attention: good  Cooperative: Yes Eye Contact: Good Speech: Increased as always, normal rate rhythm and volume Psychomotor Activity: Normal Memory/Concentration: Intact Oriented: person, place, time/date and situation Mood: Euthymic Affect: Full Range Thought Processes and Associations: Logical Fund of Knowledge: Fair Thought Content: No suicidal or homicidal thoughts Insight: Fair Judgement: Fair  Diagnosis: Bipolar disorder, ADHD inattentive type by history  Treatment Plan: We will go up on patient's Lamictal as requested we will increase her Lamictal to 150 mg a day I have sent a refill request to the pharmacy. I will see her back in 2 months. Patient to call with concerns.  Jamse Mead, MD

## 2011-11-07 ENCOUNTER — Ambulatory Visit: Payer: 59 | Admitting: Psychologist

## 2011-11-08 ENCOUNTER — Other Ambulatory Visit: Payer: Self-pay | Admitting: Family Medicine

## 2011-11-08 DIAGNOSIS — R102 Pelvic and perineal pain: Secondary | ICD-10-CM

## 2011-11-15 ENCOUNTER — Ambulatory Visit
Admission: RE | Admit: 2011-11-15 | Discharge: 2011-11-15 | Disposition: A | Discharge status: Home / Self Care | Payer: 59 | Source: Ambulatory Visit | Attending: Family Medicine | Admitting: Family Medicine

## 2011-11-15 DIAGNOSIS — R102 Pelvic and perineal pain: Secondary | ICD-10-CM

## 2011-11-21 ENCOUNTER — Telehealth (HOSPITAL_COMMUNITY): Payer: Self-pay

## 2011-11-23 NOTE — Telephone Encounter (Signed)
Return phone patient's PA. Therapist reports the patient has been more somatic. She has episodes recent she complains of shortness of breath. The therapist use it as anxiety. The patient denies being manic. She does still continue taking her Lamictal. Recently she's been up all night and sleeping all day. She will be starting [employer] in and needs to be back on schedule. The PA restarted BuSpar for the patient. She will contact the mother and have the patient come in to see me in about a month.

## 2011-12-19 ENCOUNTER — Ambulatory Visit: Payer: 59 | Admitting: Psychologist

## 2011-12-26 ENCOUNTER — Ambulatory Visit: Payer: 59 | Admitting: Psychologist

## 2011-12-26 DIAGNOSIS — F3110 Bipolar disorder, current episode manic without psychotic features, unspecified: Secondary | ICD-10-CM

## 2011-12-27 ENCOUNTER — Ambulatory Visit: Payer: 59 | Admitting: Psychologist

## 2012-01-16 ENCOUNTER — Ambulatory Visit: Payer: 59 | Admitting: Psychologist

## 2012-01-16 DIAGNOSIS — F3110 Bipolar disorder, current episode manic without psychotic features, unspecified: Secondary | ICD-10-CM

## 2012-02-13 ENCOUNTER — Ambulatory Visit: Payer: 59 | Admitting: Psychologist

## 2012-02-13 ENCOUNTER — Ambulatory Visit: Payer: Self-pay | Admitting: Psychologist

## 2012-02-20 ENCOUNTER — Ambulatory Visit: Payer: 59 | Admitting: Psychologist

## 2012-02-20 DIAGNOSIS — F3110 Bipolar disorder, current episode manic without psychotic features, unspecified: Secondary | ICD-10-CM

## 2012-03-19 ENCOUNTER — Ambulatory Visit: Payer: 59 | Admitting: Psychologist

## 2012-03-21 ENCOUNTER — Ambulatory Visit (INDEPENDENT_AMBULATORY_CARE_PROVIDER_SITE_OTHER): Payer: 59 | Admitting: Psychologist

## 2012-03-21 DIAGNOSIS — F3110 Bipolar disorder, current episode manic without psychotic features, unspecified: Secondary | ICD-10-CM

## 2012-04-02 ENCOUNTER — Ambulatory Visit: Payer: 59 | Admitting: Psychologist

## 2012-04-24 ENCOUNTER — Ambulatory Visit (INDEPENDENT_AMBULATORY_CARE_PROVIDER_SITE_OTHER): Payer: 59 | Admitting: Psychologist

## 2012-04-24 DIAGNOSIS — F3110 Bipolar disorder, current episode manic without psychotic features, unspecified: Secondary | ICD-10-CM

## 2012-05-28 ENCOUNTER — Encounter (HOSPITAL_COMMUNITY): Payer: Self-pay | Admitting: Psychiatry

## 2012-05-28 ENCOUNTER — Ambulatory Visit (INDEPENDENT_AMBULATORY_CARE_PROVIDER_SITE_OTHER): Payer: 59 | Admitting: Psychiatry

## 2012-05-28 ENCOUNTER — Other Ambulatory Visit (HOSPITAL_COMMUNITY): Payer: Self-pay | Admitting: Psychiatry

## 2012-05-28 VITALS — BP 120/80 | Ht 62.5 in | Wt 120.0 lb

## 2012-05-28 DIAGNOSIS — F909 Attention-deficit hyperactivity disorder, unspecified type: Secondary | ICD-10-CM

## 2012-05-28 DIAGNOSIS — F988 Other specified behavioral and emotional disorders with onset usually occurring in childhood and adolescence: Secondary | ICD-10-CM

## 2012-05-28 DIAGNOSIS — F319 Bipolar disorder, unspecified: Secondary | ICD-10-CM

## 2012-05-28 NOTE — Progress Notes (Signed)
   [facility] Behavioral Health Follow-up Outpatient Visit  DINIA JOYNT 1991/02/19   Subjective: The patient is a 21 year old female who has been followed by [facility] since September 2002. I have been treating her since August 2008. 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  At her last appointment, I increased her Lamictal to 150 mg secondary to increased stress. Her mom called in December stating that the patient was more somatic. Her PA had put her on BuSpar. Patient presents today doing very well. 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 She has obtained her adult driving permit. She continues to date her boyfriend. He continues to live with them. 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 Her mother finally kicked the homeless children out of their home. The patient is concerned about weight gain secondary to changing birth control to straight birth control pills. Patient is doing well with the increase in Lamictal. She is sleeping better. Appetite is good. She feels that her stress is under control.  Filed Vitals:   05/28/12 1331  BP: 120/80    Mental Status Examination  Appearance: Casually dressed Alert: Yes Attention: good  Cooperative: Yes Eye Contact: Good Speech: Increased as always, normal rate rhythm and volume Psychomotor Activity: Normal Memory/Concentration: Intact Oriented: person, place, time/date and situation Mood: Euthymic Affect: Full Range Thought Processes and Associations: Logical Fund of Knowledge: Fair Thought Content: No suicidal or homicidal thoughts Insight: Fair Judgement: Fair  Diagnosis: Bipolar disorder, ADHD inattentive type by history  Treatment Plan: I will continue Lamictal 150 mg daily. I will see the patient back in 6 months.  Jamse Mead, MD

## 2012-10-28 ENCOUNTER — Telehealth (HOSPITAL_COMMUNITY): Payer: Self-pay

## 2012-10-29 MED ORDER — LAMOTRIGINE 100 MG PO TABS: 100.0000 mg | ORAL_TABLET | Freq: Two times a day (BID) | ORAL | Status: DC

## 2012-10-29 NOTE — Telephone Encounter (Signed)
Worse with time change. More moody. I will increase Lamictal to 100 mg twice a day. Patient has appointment on December 18.

## 2012-11-04 ENCOUNTER — Telehealth (HOSPITAL_COMMUNITY): Payer: Self-pay

## 2012-11-04 NOTE — Telephone Encounter (Signed)
lamictal increase to 100mg  day and pt is getting worse. Mom trying to avoid hospitalization for her. Pt no si/hi

## 2012-11-04 NOTE — Telephone Encounter (Signed)
Put on Dr. Durene Fruits schedule please.

## 2012-11-08 ENCOUNTER — Encounter (HOSPITAL_COMMUNITY): Payer: Self-pay | Admitting: Psychiatry

## 2012-11-08 ENCOUNTER — Ambulatory Visit (INDEPENDENT_AMBULATORY_CARE_PROVIDER_SITE_OTHER): Payer: 59 | Admitting: Psychiatry

## 2012-11-08 VITALS — BP 137/84 | HR 93 | Ht 60.0 in | Wt 124.0 lb

## 2012-11-08 DIAGNOSIS — F319 Bipolar disorder, unspecified: Secondary | ICD-10-CM

## 2012-11-08 DIAGNOSIS — F909 Attention-deficit hyperactivity disorder, unspecified type: Secondary | ICD-10-CM

## 2012-11-08 MED ORDER — LAMOTRIGINE 25 MG PO TABS: ORAL_TABLET | ORAL | Status: DC

## 2012-11-08 NOTE — Progress Notes (Signed)
[facility] Behavioral Health Follow-up Outpatient Visit  Whitney Gonzalez Apr 13, 1991  Date: 11/08/2012  Subjective:  Whitney Gonzalez is a 21 y/o female with a past psychiatric history significant for The patient is referred for psychiatric services for medication management. The patient was scheduled for an earlier appointment due to worsening of wood without SI/HI. Lamictal was increased in the interim between her last appointment and today from 150 mg to 200 mg. The patient tolerated the increase without any side effects.  Patient and her mother agree with some improvement in mood with increase of Lamictal. Patient would like to have slight increase, she is currently taking 200 mg, prior to adjunctive therapy.  In the area of affective symptoms, patient appears euthymic. Patient denies current suicidal ideation, intent, or plan. Patient denies current homicidal ideation, intent, or plan. Patient denies auditory hallucinations. Patient denies visual hallucinations. Patient denies symptoms of paranoia. Patient states sleep is good.  She report that she no longer has vivid dreams. Appetite is good. Energy level is good. Patient denies symptoms of anhedonia. Patient denies hopelessness, helplessness, or guilt.   Denies any recent episodes consistent with mania, particularly decreased need for sleep with increased energy, grandiosity, impulsivity, hyperverbal and pressured speech, or increased productivity. Denies any recent symptoms consistent with psychosis, particularly auditory or visual hallucinations, thought broadcasting/insertion/withdrawal, or ideas of reference. She  denies excessive worry to the point of physical symptoms as well as any panic attacks. Denies any history of trauma or symptoms consistent with PTSD such as flashbacks, nightmares, hypervigilance, feelings of numbness or inability to connect with others.   Review of Systems  Constitutional: Negative.   Respiratory: Negative.    Cardiovascular: Negative.   Gastrointestinal: Negative.    Filed Vitals:   11/08/12 1343  BP: 137/84  Pulse: 93  Height: 5' (1.524 m)  Weight: 124 lb (56.246 kg)   Physical Exam  Vitals reviewed. Constitutional: She appears well-developed and well-nourished. No distress.  Skin: She is not diaphoretic.    Psychiatric specialty examination: Appearance: casual  Alert: Yes Attention: good  Cooperative: Yes Eye Contact: Good Speech: Normal Psychomotor Activity: Normal Memory/Concentration: Immediate 3/3; Recent  Oriented: person, place, time/date and situation Mood: Euthymic" better"  Affect: Congruent and Labile Thought Processes and Associations: Coherent, Linear and Logical Fund of Knowledge: Good Thought Content: Patient denies suicidal or homicidal ideation, intent, or  plans Insight: Fair Judgement: Good  Diagnosis:  Axis I: Bipolar I Disorder, Attention Deficit Hyperactivity Disorder  Treatment Plan:   PLAN:  1. Affirm with the patient that the medications are taken as ordered. Patient expressed understanding of how their medications were to be used.  2. Continue the following psychiatric medications as written prior to this appointment/ with the following changes:  a) Increase Lamictal to 225 mg. Though usual dose range as monotherapy is generally 200 mg, will have a trial increase, given recent improvement in mood, before adjunctive therapy with and atypical atypical antipsychotic or lithium. Will consider further increase if needed in the future. 3. Therapy: brief supportive therapy provided. Continue current services. Discussed psychosocial stressors.  4. Risks and benefits, side effects and alternatives discussed with patient, she was given an opportunity to ask questions about his/her medication, illness, and treatment. All current psychiatric medications have been reviewed and discussed with the patient and adjusted as clinically appropriate. The patient has  been provided an accurate and updated list of the medications being now prescribed.  5. Patient told to call clinic if any problems occur.  Patient advised to go to ER  if she should develop SI/HI, side effects, or if symptoms worsen. Has crisis numbers to call if needed.   6. No labs warranted at this time.  7. The patient was encouraged to keep all PCP and specialty clinic appointments.  8. Patient was instructed to return to clinic in 1 month.  9. The patient was advised to call and cancel their mental health appointment within 24 hours of the appointment, if they are unable to keep the appointment, as well as the three no show and termination from clinic policy. 10. The patient expressed understanding of the plan and agrees with the above.    Jacqulyn Cane, MD

## 2012-11-20 ENCOUNTER — Ambulatory Visit (INDEPENDENT_AMBULATORY_CARE_PROVIDER_SITE_OTHER): Payer: 59 | Admitting: Psychiatry

## 2012-11-20 VITALS — BP 118/72 | Ht 60.0 in | Wt 123.0 lb

## 2012-11-20 DIAGNOSIS — F988 Other specified behavioral and emotional disorders with onset usually occurring in childhood and adolescence: Secondary | ICD-10-CM

## 2012-11-20 DIAGNOSIS — F319 Bipolar disorder, unspecified: Secondary | ICD-10-CM

## 2012-11-20 DIAGNOSIS — F900 Attention-deficit hyperactivity disorder, predominantly inattentive type: Secondary | ICD-10-CM

## 2012-11-20 MED ORDER — LAMOTRIGINE 25 MG PO TABS: 50.0000 mg | ORAL_TABLET | Freq: Every day | ORAL | Status: DC

## 2012-11-21 ENCOUNTER — Encounter (HOSPITAL_COMMUNITY): Payer: Self-pay | Admitting: Psychiatry
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
## 2012-12-26 ENCOUNTER — Ambulatory Visit (HOSPITAL_COMMUNITY): Payer: Self-pay | Admitting: Psychiatry

## 2013-01-02 ENCOUNTER — Ambulatory Visit (INDEPENDENT_AMBULATORY_CARE_PROVIDER_SITE_OTHER): Payer: 59 | Admitting: Psychiatry

## 2013-01-02 ENCOUNTER — Encounter (HOSPITAL_COMMUNITY): Payer: Self-pay | Admitting: Psychiatry

## 2013-01-02 VITALS — BP 118/72 | Ht 60.0 in | Wt 122.0 lb

## 2013-01-02 DIAGNOSIS — F319 Bipolar disorder, unspecified: Secondary | ICD-10-CM

## 2013-01-02 MED ORDER — LAMOTRIGINE 150 MG PO TABS: 150.0000 mg | ORAL_TABLET | Freq: Two times a day (BID) | ORAL | Status: DC
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
## 2013-02-11 ENCOUNTER — Ambulatory Visit (HOSPITAL_COMMUNITY): Payer: Self-pay | Admitting: Psychiatry

## 2013-02-20 ENCOUNTER — Encounter (HOSPITAL_COMMUNITY): Payer: Self-pay | Admitting: Psychiatry

## 2013-02-20 ENCOUNTER — Ambulatory Visit (INDEPENDENT_AMBULATORY_CARE_PROVIDER_SITE_OTHER): Payer: 59 | Admitting: Psychiatry

## 2013-02-20 VITALS — BP 118/78 | Ht 60.0 in | Wt 120.0 lb

## 2013-02-20 DIAGNOSIS — F319 Bipolar disorder, unspecified: Secondary | ICD-10-CM

## 2013-02-20 MED ORDER — LAMOTRIGINE 200 MG PO TABS: 200.0000 mg | ORAL_TABLET | Freq: Two times a day (BID) | ORAL | Status: DC

## 2013-02-20 MED ORDER — BUSPIRONE HCL 15 MG PO TABS: 15.0000 mg | ORAL_TABLET | Freq: Every day | ORAL | Status: DC

## 2013-02-20 NOTE — Progress Notes (Signed)
   [facility] Behavioral Health Follow-up Outpatient Visit  Whitney Gonzalez 02/27/1991   Subjective: The patient is a 22 year old female who has been followed by [facility] since September 2002. I have been treating her since August 2008. 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  At her last appointment, I increased Lamictal to 150 mg twice a day secondary to continuing mood issues. The patient is seen alone, but mom tells me in the waiting room that she's not doing well the twice a day dosing. Patient is excited because she starting a new job at [employer]. She states that she's been happier. She continues to be very negative. She'll be working part-time 20-20 hours per week. She would rather do full-time. Dad has not been around much. Mom is been stressed. Mom is been taking care of maternal grandmother who had knee surgery. Maternal grandmother is to the house a lot. Patient has issues with her maternal grandmother speaks to her. She will talk back. The patient has stopped working out. Sister tells me the patient is been much more irritable and hard to be around. The patient has been compliant with the twice a day dosing.  Filed Vitals:   02/20/13 1236  BP: 118/78    Mental Status Examination  Appearance: Casually dressed Alert: Yes Attention: good  Cooperative: Yes Eye Contact: Good Speech: Increased as always, normal rate rhythm and volume Psychomotor Activity: Normal Memory/Concentration: Intact Oriented: person, place, time/date and situation Mood: Euthymic Affect: Full Range Thought Processes and Associations: Logical Fund of Knowledge: Fair Thought Content: No suicidal or homicidal thoughts Insight: Fair Judgement: Fair  Diagnosis: Bipolar disorder, ADHD inattentive type by history  Treatment Plan: I will increase Lamictal to 200 mg twice a day. I will see the patient back in 2 months. Patient may call with  concerns.  Jamse Mead, MD

## 2013-03-28 ENCOUNTER — Telehealth (HOSPITAL_COMMUNITY): Payer: Self-pay

## 2013-03-28 NOTE — Telephone Encounter (Signed)
Ok

## 2013-04-15 ENCOUNTER — Ambulatory Visit (INDEPENDENT_AMBULATORY_CARE_PROVIDER_SITE_OTHER): Payer: 59 | Admitting: Psychiatry

## 2013-04-15 ENCOUNTER — Encounter (HOSPITAL_COMMUNITY): Payer: Self-pay | Admitting: Psychiatry

## 2013-04-15 VITALS — BP 112/72 | Ht 60.0 in | Wt 123.0 lb

## 2013-04-15 DIAGNOSIS — F319 Bipolar disorder, unspecified: Secondary | ICD-10-CM

## 2013-04-15 DIAGNOSIS — F900 Attention-deficit hyperactivity disorder, predominantly inattentive type: Secondary | ICD-10-CM

## 2013-04-15 DIAGNOSIS — F988 Other specified behavioral and emotional disorders with onset usually occurring in childhood and adolescence: Secondary | ICD-10-CM

## 2013-04-15 MED ORDER — LAMOTRIGINE 25 MG PO TABS: ORAL_TABLET | ORAL | Status: DC

## 2013-04-15 NOTE — Progress Notes (Signed)
   [facility] Behavioral Health Follow-up Outpatient Visit  Whitney Gonzalez 1991-11-06   Subjective: The patient is a 22 year old female who has been followed by [facility] since September 2002. I have been treating her since August 2008. 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  At her last appointment, I increased Lamictal to 200 mg twice a day secondary to continuing mood issues. Mom called on 03/28/2013 stating the patient was off her medications. She presents today. She has started working at [employer]. She is working 24-32 hours a week. The patient reports that since she started working, she began missing doses. She eventually just stopped her medication. She stated she felt better initially, but has been more irritable and easily frustrated. Her sister sees her as oversensitive. The patient is still living at home. She has a new boyfriend for a few days. She has not slept for the last few nights because they've been arguing a lot. Mom and grandmother are in [place]. Grandmother is having hip replacement, and mom's taking care of her. They'll be going to [place] next week. That is making him down there. Patient states she still intermittently taking her BuSpar.  Filed Vitals:   04/15/13 1006  BP: 112/72   Active Ambulatory Problems    Diagnosis Date Noted  . ADHD (attention deficit hyperactivity disorder) 05/28/2012  . Bipolar 1 disorder 05/28/2012   Resolved Ambulatory Problems    Diagnosis Date Noted  . No Resolved Ambulatory Problems   Past Medical History  Diagnosis Date  . Asthma   . GERD (gastroesophageal reflux disease)   . Headache    Current Outpatient Prescriptions on File Prior to Visit  Medication Sig Dispense Refill  . busPIRone (BUSPAR) 15 MG tablet Take 1 tablet (15 mg total) by mouth daily.  90 tablet  1   No current facility-administered medications on file prior to visit.   Review of Systems  - General ROS: negative for - sleep disturbance or weight gain Psychological ROS: negative for - anxiety or depression Cardiovascular ROS: no chest pain or dyspnea on exertion Musculoskeletal ROS: negative for - gait disturbance or muscular weakness Neurological ROS: negative for - dizziness or headaches  Mental Status Examination  Appearance: Casually dressed Alert: Yes Attention: good  Cooperative: Yes Eye Contact: Good Speech: Increased as always, normal rate rhythm and volume Psychomotor Activity: Normal Memory/Concentration: Intact Oriented: person, place, time/date and situation Mood: Euthymic Affect: Full Range Thought Processes and Associations: Logical Fund of Knowledge: Fair Thought Content: No suicidal or homicidal thoughts Insight: Fair Judgement: Fair  Diagnosis: Bipolar disorder, ADHD inattentive type by history  Treatment Plan: I will restart Lamictal 25 mg daily for 2 weeks then increase to 50 mg daily. I will see the patient back in one month. Patient may continue BuSpar. Mom may call with concerns. This is been informed that if she stops her Lamictal again without discussing with me, I will discharge her for noncompliance.  Jamse Mead, MD

## 2013-05-21 ENCOUNTER — Ambulatory Visit (HOSPITAL_COMMUNITY): Payer: Self-pay | Admitting: Psychiatry

## 2013-06-03 ENCOUNTER — Ambulatory Visit (INDEPENDENT_AMBULATORY_CARE_PROVIDER_SITE_OTHER): Payer: 59 | Admitting: Psychiatry

## 2013-06-03 ENCOUNTER — Encounter (HOSPITAL_COMMUNITY): Payer: Self-pay | Admitting: Psychiatry

## 2013-06-03 VITALS — BP 100/62 | Ht 60.0 in | Wt 122.0 lb

## 2013-06-03 DIAGNOSIS — F988 Other specified behavioral and emotional disorders with onset usually occurring in childhood and adolescence: Secondary | ICD-10-CM

## 2013-06-03 DIAGNOSIS — F319 Bipolar disorder, unspecified: Secondary | ICD-10-CM

## 2013-06-03 DIAGNOSIS — F900 Attention-deficit hyperactivity disorder, predominantly inattentive type: Secondary | ICD-10-CM

## 2013-06-03 MED ORDER — BUSPIRONE HCL 15 MG PO TABS: 15.0000 mg | ORAL_TABLET | Freq: Every day | ORAL | Status: DC

## 2013-06-03 MED ORDER — LAMOTRIGINE 100 MG PO TABS: ORAL_TABLET | ORAL | Status: DC

## 2013-06-03 MED ORDER — LAMOTRIGINE 100 MG PO TABS: 100.0000 mg | ORAL_TABLET | Freq: Every day | ORAL | Status: DC
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
## 2013-07-15 ENCOUNTER — Encounter (HOSPITAL_COMMUNITY): Payer: Self-pay | Admitting: Psychiatry

## 2013-07-15 ENCOUNTER — Ambulatory Visit (INDEPENDENT_AMBULATORY_CARE_PROVIDER_SITE_OTHER): Payer: 59 | Admitting: Psychiatry

## 2013-07-15 VITALS — BP 100/62 | Ht 60.0 in | Wt 126.0 lb

## 2013-07-15 DIAGNOSIS — F988 Other specified behavioral and emotional disorders with onset usually occurring in childhood and adolescence: Secondary | ICD-10-CM

## 2013-07-15 DIAGNOSIS — F319 Bipolar disorder, unspecified: Secondary | ICD-10-CM

## 2013-07-15 MED ORDER — LAMOTRIGINE 150 MG PO TABS: 150.0000 mg | ORAL_TABLET | Freq: Every day | ORAL | Status: DC

## 2013-07-15 NOTE — Progress Notes (Signed)
   [facility] Follow-up Outpatient Visit  Whitney Gonzalez 08-16-1991   Subjective: The patient is a 22 year old female who has been followed by [facility] since September 2002. I have been treating her since August 2008. 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  At her last appointment, I increased her Lamictal 100 mg daily and continued her BuSpar. She presents today. She got her driver's license on her 17XW birthday. She did get pulled over for speeding one week later, but talk her way out of the ticket. The patient continues to work at a [employer]. She states cut back on her hours. She is now dating. She has been compliant with her Lamictal. She feels that is helping her mood, but could still be improved. The patient endorses good sleep and appetite. There are still some irritability. Grandmother is currently visiting, and she had a huge argument with her this morning. They're still some discord between her and her sister. There is no suicidal thoughts. There is no cutting.  Filed Vitals:   07/15/13 1041  BP: 100/62   Active Ambulatory Problems    Diagnosis Date Noted  . ADHD (attention deficit hyperactivity disorder) 05/28/2012  . Bipolar 1 disorder 05/28/2012   Resolved Ambulatory Problems    Diagnosis Date Noted  . No Resolved Ambulatory Problems   Past Medical History  Diagnosis Date  . Asthma   . GERD (gastroesophageal reflux disease)   . RUEAVWUJ(811.9)    Current Outpatient Prescriptions on File Prior to Visit  Medication Sig Dispense Refill  . busPIRone (BUSPAR) 15 MG tablet Take 1 tablet (15 mg total) by mouth daily.  90 tablet  2   No current facility-administered medications on file prior to visit.   Review of Systems - General ROS: negative for - sleep disturbance or weight gain Psychological ROS: negative for - anxiety or depression Cardiovascular ROS: no chest pain or  dyspnea on exertion Musculoskeletal ROS: negative for - gait disturbance or muscular weakness Neurological ROS: negative for - dizziness or headaches  Mental Status Examination  Appearance: Casually dressed Alert: Yes Attention: good  Cooperative: Yes Eye Contact: Good Speech: Increased as always, normal rate rhythm and volume Psychomotor Activity: Normal Memory/Concentration: Intact Oriented: person, place, time/date and situation Mood: Euthymic Affect: Full Range Thought Processes and Associations: Logical Fund of Knowledge: Fair Thought Content: No suicidal or homicidal thoughts Insight: Fair Judgement: Fair  Diagnosis: Bipolar disorder, ADHD inattentive type by history  Treatment Plan: I will increase Lamictal to 150 mg daily. Patient may continue to take the BuSpar when necessary. I will see her back in 2 months. Patient may call with concerns. Jamse Mead, MD

## 2013-09-23 ENCOUNTER — Ambulatory Visit (HOSPITAL_COMMUNITY): Payer: Self-pay | Admitting: Psychiatry

## 2013-10-17 ENCOUNTER — Ambulatory Visit (HOSPITAL_COMMUNITY): Payer: Self-pay | Admitting: Psychiatry

## 2013-12-12 ENCOUNTER — Encounter (INDEPENDENT_AMBULATORY_CARE_PROVIDER_SITE_OTHER): Payer: Self-pay

## 2013-12-12 ENCOUNTER — Ambulatory Visit (INDEPENDENT_AMBULATORY_CARE_PROVIDER_SITE_OTHER): Payer: 59 | Admitting: Psychiatry

## 2013-12-12 ENCOUNTER — Encounter (HOSPITAL_COMMUNITY): Payer: Self-pay | Admitting: Psychiatry

## 2013-12-12 VITALS — BP 121/62 | HR 71 | Wt 125.0 lb

## 2013-12-12 DIAGNOSIS — F319 Bipolar disorder, unspecified: Secondary | ICD-10-CM

## 2013-12-12 DIAGNOSIS — F3130 Bipolar disorder, current episode depressed, mild or moderate severity, unspecified: Secondary | ICD-10-CM

## 2013-12-12 MED ORDER — LAMOTRIGINE 25 MG PO TABS: 25.0000 mg | ORAL_TABLET | Freq: Every day | ORAL | Status: DC

## 2013-12-12 MED ORDER — LAMOTRIGINE 150 MG PO TABS: 150.0000 mg | ORAL_TABLET | Freq: Every day | ORAL | Status: DC

## 2013-12-12 MED ORDER — BUSPIRONE HCL 15 MG PO TABS: 15.0000 mg | ORAL_TABLET | Freq: Every day | ORAL | Status: DC

## 2013-12-12 NOTE — Progress Notes (Signed)
[facility] [phone] Progress Note  Whitney Gonzalez [id number] 23 y.o.  12/12/2013 1:37 PM  Chief Complaint:  HPI Comments: Whitney Gonzalez  a 23 y/o female with a past psychiatric history significant for ADHD and Bipolar I Disorder. The patient is referred for psychiatric services for psychiatric evaluation and medication management.    . Location: The patient reports she has been doing relatively well with some minor stressors. . Quality:  The patient reports that her main stressors are:  -her "grandmother's health"-she is now in hospice. -"boyfriend"- his legal issues and substance abuse issues.  In the area of affective symptoms, patient appears euthymic. Patient denies current suicidal ideation, intent, or plan. Patient denies current homicidal ideation, intent, or plan. Patient denies auditory hallucinations. Patient denies visual hallucinations. Patient denies symptoms of paranoia. Patient states sleep is good, with approximately 6-8 hours of sleep per night. Appetite is good. Energy level is good. Patient endorses/denies symptoms of anhedonia. Patient endorses/denies hopelessness, helplessness, or guilt.  . Severity: Depression: 7/10 (0=Very depressed; 5=Neutral; 10=Very Happy)  Anxiety- 5/10 (0=no anxiety; 5= moderate/tolerable anxiety; 10= panic attacks)  . Duration- Since the age of 56.   . Timing: No specific timing.  . Context: Familial Stressors- with father, grandmother's health.   . Modifying factors: Improves with breathing techniques, walking, reading and shopping.   . Associated signs and symptoms: Denies any recent episodes consistent with mania, particularly decreased need for sleep with increased energy, grandiosity, impulsivity, hyperverbal and pressured speech, or increased productivity. Denies any recent symptoms consistent with psychosis, particularly auditory or visual hallucinations, thought broadcasting/insertion/withdrawal, or ideas of  reference. Also denies excessive worry to the point of physical symptoms as well as any panic attacks. Denies any history of trauma or symptoms consistent with PTSD such as flashbacks, nightmares, hypervigilance, feelings of numbness or inability to connect with others.    History of Present Illness: Suicidal Ideation: Negative Plan Formed: Negative Patient has means to carry out plan: Negative  Homicidal Ideation: Negative Plan Formed: Negative Patient has means to carry out plan: Negative  Review of Systems: Psychiatric: Agitation: Negative Hallucination: Negative Depressed Mood: Negative Insomnia: Negative Hypersomnia: Negative Altered Concentration: Negative Feels Worthless: Negative Grandiose Ideas: Negative Belief In Special Powers: Negative New/Increased Substance Abuse: Negative Compulsions: Negative  Neurologic: Headache: Negative Seizure: Negative Paresthesias: Negative  Past Medical Family, Social History:  Past Medical History  Diagnosis Date  . Asthma   . GERD (gastroesophageal reflux disease)   . Headache(784.0)     Migraines   Family History  Problem Relation Age of Onset  . Bipolar disorder Father   . ADD / ADHD Father   . Anxiety disorder Father   . Diabetes Mellitus II Maternal Grandfather   . CAD Maternal Grandfather   . Depression Maternal Grandmother   . Diabetes Mellitus II Maternal Grandmother   . Hypertension Maternal Grandmother   . Hyperlipidemia Maternal Grandmother   . CAD Paternal Grandfather   . Alcohol abuse Paternal Grandmother   . Depression Paternal Grandmother   . CAD Paternal Grandmother     History   Social History  . Marital Status: Single    Spouse Name: N/A    Number of Children: N/A  . Years of Education: N/A   Social History Main Topics  . Smoking status: Never Smoker   . Smokeless tobacco: Never Used  . Alcohol Use: No     Comment: Last drink October. 2014.  . Drug Use: No  . Sexual Activity: Not Currently  Other Topics Concern  . None   Social History Narrative  . None     Outpatient Encounter Prescriptions as of 12/12/2013  Medication Sig  . busPIRone (BUSPAR) 15 MG tablet Take 1 tablet (15 mg total) by mouth daily.  [name] lamoTRIgine (LAMICTAL) 150 MG tablet Take 1 tablet (150 mg total) by mouth daily.    Past Psychiatric History/Hospitalization(s): Anxiety: Negative Bipolar Disorder: Yes Depression: Yes Mania: Negative Psychosis: Negative Schizophrenia: Negative Personality Disorder: Negative Hospitalization for psychiatric illness: Yes History of Electroconvulsive Shock Therapy: Negative Prior Suicide Attempts: No  Physical Exam: Review of Systems  Constitutional: Negative for fever, chills and weight loss.  Respiratory: Negative for cough, hemoptysis, sputum production and shortness of breath.   Cardiovascular: Negative for chest pain, palpitations and leg swelling.  Gastrointestinal: Negative for heartburn, nausea, vomiting, abdominal pain, diarrhea and constipation.  Genitourinary: Negative for dysuria and urgency.  Skin: Negative for itching and rash.  Neurological: Negative for dizziness, tingling, tremors and loss of consciousness.    Constitutional:  BP 121/62  Pulse 71  Wt 125 lb (56.7 kg)  LMP 12/12/2013  General Appearance: alert, oriented, no acute distress and well nourished  Musculoskeletal: Strength & Muscle Tone: within normal limits Gait & Station: normal Patient leans: N/A  Psychiatric: General Appearance: Casual and Well Groomed  Engineer, water::  Good  Speech:  Clear and Coherent and Normal Rate  Volume:  Normal  Mood:  "pretty good."  Affect:  Appropriate, Congruent and Full Range  Thought Process:  Coherent, Goal Directed, Linear and Logical  Orientation:  Full (Time, Place, and Person)  Thought Content:  WDL  Suicidal Thoughts:  No  Homicidal Thoughts:  No  Memory:  Immediate;   Fair Recent;   Fair Remote;   Good  Judgement:  Good   Insight:  Good  Psychomotor Activity:  Normal  Concentration:  Fair  Recall:  Good  Akathisia:  No  Handed:  Right  AIMS (if indicated):  Not indicated   Assets:  Communication Skills Desire for Improvement Financial Resources/Insurance Housing Intimacy Leisure Time Physical Health Resilience Social Support Building surveyor (Choose Three): Review of Psycho-Social Stressors (1), Review and summation of old records (2), Established Problem, Worsening (2), Review of Medication Regimen & Side Effects (2) and Review of New Medication or Change in Dosage (2)  Assessment: Axis I: Bipolar I Disorder, most recent episode depressed, without psychotic features.   Plan:   Plan of Care:  PLAN:  1. Affirm with the patient that the medications are taken as ordered. Patient  expressed understanding of how their medications were to be used.    Laboratory: No labs warranted at this time.    Psychotherapy: Therapy: brief supportive therapy provided.  Discussed psychosocial stressors in detail.   Medications:  Continue the following psychiatric medications as written prior to this appointment with the following changes::  a) busPIRone (BUSPAR) 15 MG tablet-Take 1 tablet (15 mg total) by mouth daily. B) lamoTRIgine (LAMICTAL) 150 MG tablet-Take 1 tablet (150 mg total) by mouth daily.  -Risks and benefits, side effects and alternatives discussed with patient, she was given an opportunity to ask questions about his/her medication, illness, and treatment. All current psychiatric medications have been reviewed and discussed with the patient and adjusted as clinically appropriate. The patient has been provided an accurate and updated list of the medications being now prescribed.   Routine PRN Medications:  Negative  Consultations: The patient was encouraged to keep  all PCP and specialty clinic appointments.   Safety Concerns:   Patient told to call clinic  if any problems occur. Patient advised to go to  ER  if she should develop SI/HI, side effects, or if symptoms worsen. Has crisis numbers to call if needed.    Other:   8. Patient was instructed to return to clinic in 1 month.  9. The patient was advised to call and cancel their mental health appointment within 24 hours of the appointment, if they are unable to keep the appointment, as well as the three no show and termination from clinic policy. 10. The patient expressed understanding of the plan and agrees with the above.  Time Spent: 25 minutes  Coralyn Helling, MD 12/12/2013

## 2013-12-30 ENCOUNTER — Other Ambulatory Visit: Payer: Self-pay | Admitting: Obstetrics and Gynecology

## 2014-01-02 ENCOUNTER — Other Ambulatory Visit: Payer: Self-pay | Admitting: Family Medicine

## 2014-01-02 ENCOUNTER — Ambulatory Visit
Admission: RE | Admit: 2014-01-02 | Discharge: 2014-01-02 | Disposition: A | Discharge status: Home / Self Care | Payer: 59 | Source: Ambulatory Visit | Attending: Family Medicine | Admitting: Family Medicine

## 2014-01-02 DIAGNOSIS — M542 Cervicalgia: Secondary | ICD-10-CM

## 2014-01-20 ENCOUNTER — Other Ambulatory Visit: Payer: Self-pay | Admitting: Obstetrics and Gynecology

## 2014-03-01 ENCOUNTER — Encounter (HOSPITAL_COMMUNITY): Payer: Self-pay

## 2014-03-01 ENCOUNTER — Inpatient Hospital Stay (HOSPITAL_COMMUNITY)
Admission: AD | Admit: 2014-03-01 | Discharge: 2014-03-01 | Disposition: A | Discharge status: Home / Self Care | Payer: 59 | Source: Ambulatory Visit | Attending: Obstetrics and Gynecology | Admitting: Obstetrics and Gynecology

## 2014-03-01 DIAGNOSIS — N925 Other specified irregular menstruation: Secondary | ICD-10-CM | POA: Insufficient documentation

## 2014-03-01 DIAGNOSIS — N939 Abnormal uterine and vaginal bleeding, unspecified: Secondary | ICD-10-CM

## 2014-03-01 DIAGNOSIS — R109 Unspecified abdominal pain: Secondary | ICD-10-CM | POA: Insufficient documentation

## 2014-03-01 DIAGNOSIS — K219 Gastro-esophageal reflux disease without esophagitis: Secondary | ICD-10-CM | POA: Insufficient documentation

## 2014-03-01 DIAGNOSIS — J45909 Unspecified asthma, uncomplicated: Secondary | ICD-10-CM | POA: Insufficient documentation

## 2014-03-01 DIAGNOSIS — N949 Unspecified condition associated with female genital organs and menstrual cycle: Secondary | ICD-10-CM | POA: Insufficient documentation

## 2014-03-01 DIAGNOSIS — N938 Other specified abnormal uterine and vaginal bleeding: Secondary | ICD-10-CM | POA: Insufficient documentation

## 2014-03-01 DIAGNOSIS — N898 Other specified noninflammatory disorders of vagina: Secondary | ICD-10-CM

## 2014-03-01 LAB — URINALYSIS, ROUTINE W REFLEX MICROSCOPIC
BILIRUBIN URINE: NEGATIVE
Glucose, UA: NEGATIVE mg/dL
Ketones, ur: NEGATIVE mg/dL
Leukocytes, UA: NEGATIVE
Nitrite: NEGATIVE
Protein, ur: NEGATIVE mg/dL
Specific Gravity, Urine: >1.03 — ABNORMAL HIGH (ref 1.005–1.030)
UROBILINOGEN UA: 0.2 mg/dL (ref 0.0–1.0)
pH: 6 (ref 5.0–8.0)

## 2014-03-01 LAB — CBC
HCT: 36.2 % (ref 36.0–46.0)
HEMOGLOBIN: 12.4 g/dL (ref 12.0–15.0)
MCH: 29.7 pg (ref 26.0–34.0)
MCHC: 34.3 g/dL (ref 30.0–36.0)
MCV: 86.6 fL (ref 78.0–100.0)
Platelets: 320 10*3/uL (ref 150–400)
RBC: 4.18 MIL/uL (ref 3.87–5.11)
RDW: 13.7 % (ref 11.5–15.5)
WBC: 4.8 10*3/uL (ref 4.0–10.5)

## 2014-03-01 LAB — WET PREP, GENITAL
Clue Cells Wet Prep HPF POC: NONE SEEN
TRICH WET PREP: NONE SEEN
Yeast Wet Prep HPF POC: NONE SEEN

## 2014-03-01 LAB — URINE MICROSCOPIC-ADD ON

## 2014-03-01 LAB — POCT PREGNANCY, URINE: Preg Test, Ur: NEGATIVE

## 2014-03-01 MED ORDER — KETOROLAC TROMETHAMINE 30 MG/ML IJ SOLN: 30.0000 mg | Freq: Once | INTRAMUSCULAR | Status: DC

## 2014-03-01 MED ORDER — KETOROLAC TROMETHAMINE 30 MG/ML IJ SOLN
30.0000 mg | Freq: Once | INTRAMUSCULAR | Status: AC
  Administered 2014-03-01: 30 mg via INTRAMUSCULAR
  Filled 2014-03-01: qty 1

## 2014-03-01 NOTE — MAU Note (Signed)
Pt presents complaining of heavy vaginal bleeding that has been ongoing since 3/16 after a colposcopy and cryotherapy following an abnormal pap. States she goes through 2 pads an hour with clots. States she is having some thick white discharge as well.

## 2014-03-01 NOTE — Discharge Instructions (Signed)
Abnormal Uterine Bleeding Abnormal uterine bleeding means bleeding from the vagina that is not your normal menstrual period. This can be:  Bleeding or spotting between periods.  Bleeding after sex (sexual intercourse).  Bleeding that is heavier or more than normal.  Periods that last longer than usual.  Bleeding after menopause. There are many problems that may cause this. Treatment will depend on the cause of the bleeding. Any kind of bleeding that is not normal should be reviewed by your doctor.  HOME CARE Watch your condition for any changes. These actions may lessen any discomfort you are having:  Do not use tampons or douches as told by your doctor.  Change your pads often. You should get regular pelvic exams and Pap tests. Keep all appointments for tests as told by your doctor. GET HELP IF:  You are bleeding for more than 1 week.  You feel dizzy at times. GET HELP RIGHT AWAY IF:   You pass out.  You have to change pads every 15 to 30 minutes.  You have belly pain.  You have a fever.  You become sweaty or weak.  You are passing large blood clots from the vagina.  You feel sick to your stomach (nauseous) and throw up (vomit). MAKE SURE YOU:  Understand these instructions.  Will watch your condition.  Will get help right away if you are not doing well or get worse. Document Released: 09/17/2009 Document Revised: 09/10/2013 Document Reviewed: 06/19/2013 [employer] Patient Information 2014 [employer].  

## 2014-03-01 NOTE — MAU Provider Note (Signed)
History     CSN: [id number]  Arrival date and time: 03/01/14 1228   First Provider Initiated Contact with Patient 03/01/14 1302      Chief Complaint  Patient presents with  . Vaginal Bleeding   HPI [name] is a 23 y.o. female who presents to MAU today with complaint of vaginal bleeding. The patient states a recent history of abnormal pap smear and colposcopy ~ 4 weeks ago. The patient was then treated in the office with Cryotherapy ~ 3 weeks ago. She has continued to have discharge and vaginal bleeding off and on since the procedure. She states that she called the office and was given a vaginal cream for a "bacterial infection." She states that when she woke up this morning she soaked through 2 overnight pads in less than an hour. Bleeding has slowed since onset today. She is also having worsening of her lower abdominal cramping. She rates her pain at 7/10 now. She has been taking Ibuprofen for the pain, but none today prior to coming in. She denies fever.    OB History   Grav Para Term Preterm Abortions TAB SAB Ect Mult Living                  Past Medical History  Diagnosis Date  . Asthma   . GERD (gastroesophageal reflux disease)   . Headache(784.0)     Migraines    Past Surgical History  Procedure Laterality Date  . Tonsilectomy, adenoidectomy, bilateral myringotomy and tubes    . Frenulectomy, lingual    . Colposcopy      Family History  Problem Relation Age of Onset  . Bipolar disorder Father   . ADD / ADHD Father   . Anxiety disorder Father   . Diabetes Mellitus II Maternal Grandfather   . CAD Maternal Grandfather   . Depression Maternal Grandmother   . Diabetes Mellitus II Maternal Grandmother   . Hypertension Maternal Grandmother   . Hyperlipidemia Maternal Grandmother   . CAD Paternal Grandfather   . Alcohol abuse Paternal Grandmother   . Depression Paternal Grandmother   . CAD Paternal Grandmother     History  Substance Use Topics   . Smoking status: Never Smoker   . Smokeless tobacco: Never Used  . Alcohol Use: No     Comment: Last drink October. 2014.    Allergies:  Allergies  Allergen Reactions  . Azithromycin Other (See Comments)    Stevens-Johnsons  . Allegra [Fexofenadine Hcl] Hives and Nausea And Vomiting  . Diphenhydramine Hcl Other (See Comments)    Hallucinations  . Prozac [Fluoxetine Hcl] Other (See Comments)    Stimulates bi polar disorder  . Sprintec 28 [Norgestimate-Eth Estradiol] Nausea And Vomiting  . Tegretol [Carbamazepine] Other (See Comments)    Kathreen Cosier  . Amoxicillin-Pot Clavulanate Nausea And Vomiting and Rash  . Codeine Palpitations    Heart rate goes over 200bpm  . Latex Rash    Prescriptions prior to admission  Medication Sig Dispense Refill  . busPIRone (BUSPAR) 15 MG tablet Take 15 mg by mouth daily.      [name] esomeprazole (NEXIUM) 40 MG capsule Take 40 mg by mouth daily at 12 noon.      [name] ibuprofen (ADVIL,MOTRIN) 200 MG tablet Take 800 mg by mouth every 6 (six) hours as needed for mild pain or moderate pain.      [name] lamoTRIgine (LAMICTAL) 150 MG tablet Take 150 mg by mouth daily. Takes with 25 mg  for total of 175 mg daily      . lamoTRIgine (LAMICTAL) 25 MG tablet Take 25 mg by mouth daily. Take with 150 mg for a total of 175 mg.      . rizatriptan (MAXALT) 10 MG tablet Take 10 mg by mouth as needed for migraine. May repeat in 2 hours if needed      . [DISCONTINUED] busPIRone (BUSPAR) 15 MG tablet Take 1 tablet (15 mg total) by mouth daily.  90 tablet  1  . [DISCONTINUED] lamoTRIgine (LAMICTAL) 150 MG tablet Take 1 tablet (150 mg total) by mouth daily.  30 tablet  2  . [DISCONTINUED] lamoTRIgine (LAMICTAL) 25 MG tablet Take 1 tablet (25 mg total) by mouth daily. Take with 150 mg for a total of 175 mg.  30 tablet  2    Review of Systems  Constitutional: Negative for fever and malaise/fatigue.  Gastrointestinal: Positive for abdominal pain. Negative for nausea, vomiting,  diarrhea and constipation.  Genitourinary: Negative for dysuria, urgency and frequency.       + vaginal bleeding, discharge   Physical Exam   Blood pressure 134/85, pulse 81, temperature 98.1 F (36.7 C), temperature source Oral, resp. rate 18, last menstrual period 02/16/2014, SpO2 100.00%.  Physical Exam  Constitutional: She is oriented to person, place, and time. She appears well-developed and well-nourished. No distress.  HENT:  Head: Normocephalic and atraumatic.  Cardiovascular: Normal rate.   Respiratory: Effort normal.  GI: Soft. Bowel sounds are normal. She exhibits no distension and no mass. There is tenderness (mild tenderness to palpation of the lower abdomen bilaterally). There is no rebound and no guarding.  Genitourinary: Cervix exhibits no motion tenderness, no discharge and no friability. There is bleeding (small amount of blood noted in the vagina) around the vagina. No vaginal discharge found.  Neurological: She is alert and oriented to person, place, and time.  Skin: Skin is warm and dry. No erythema.  Psychiatric: She has a normal mood and affect.   Results for orders placed during the hospital encounter of 03/01/14 (from the past 24 hour(s))  URINALYSIS, ROUTINE W REFLEX MICROSCOPIC     Status: Abnormal   Collection Time    03/01/14 12:40 PM      Result Value Ref Range   Color, Urine YELLOW  YELLOW   APPearance HAZY (*) CLEAR   Specific Gravity, Urine >1.030 (*) 1.005 - 1.030   pH 6.0  5.0 - 8.0   Glucose, UA NEGATIVE  NEGATIVE mg/dL   Hgb urine dipstick LARGE (*) NEGATIVE   Bilirubin Urine NEGATIVE  NEGATIVE   Ketones, ur NEGATIVE  NEGATIVE mg/dL   Protein, ur NEGATIVE  NEGATIVE mg/dL   Urobilinogen, UA 0.2  0.0 - 1.0 mg/dL   Nitrite NEGATIVE  NEGATIVE   Leukocytes, UA NEGATIVE  NEGATIVE  URINE MICROSCOPIC-ADD ON     Status: None   Collection Time    03/01/14 12:40 PM      Result Value Ref Range   Squamous Epithelial / LPF RARE  RARE   RBC / HPF TOO  NUMEROUS TO COUNT  <3 RBC/hpf   Bacteria, UA RARE  RARE   Urine-Other MUCOUS PRESENT    POCT PREGNANCY, URINE     Status: None   Collection Time    03/01/14 12:50 PM      Result Value Ref Range   Preg Test, Ur NEGATIVE  NEGATIVE  WET PREP, GENITAL     Status: Abnormal   Collection Time  03/01/14  1:25 PM      Result Value Ref Range   Yeast Wet Prep HPF POC NONE SEEN  NONE SEEN   Trich, Wet Prep NONE SEEN  NONE SEEN   Clue Cells Wet Prep HPF POC NONE SEEN  NONE SEEN   WBC, Wet Prep HPF POC RARE (*) NONE SEEN  CBC     Status: None   Collection Time    03/01/14  1:40 PM      Result Value Ref Range   WBC 4.8  4.0 - 10.5 K/uL   RBC 4.18  3.87 - 5.11 MIL/uL   Hemoglobin 12.4  12.0 - 15.0 g/dL   HCT 36.2  36.0 - 46.0 %   MCV 86.6  78.0 - 100.0 fL   MCH 29.7  26.0 - 34.0 pg   MCHC 34.3  30.0 - 36.0 g/dL   RDW 13.7  11.5 - 15.5 %   Platelets 320  150 - 400 K/uL    MAU Course  Procedures None  MDM UPT - negative UA, wet prep, GC/Chlamydia and CBC today Discussed patient and lab results with Dr. Matthew Saras. [place] for discharge. IM Toradol in MAU today for pain. Follow-up in the office this week.   Assessment and Plan  A: Vaginal bleeding after Cryotherapy Pelvic pain  P: Discharge home Patient advised to continue Ibuprofen PRN pain Patient advised to call the office for a follow-up appointment this week Patient may return to MAU as needed or if her condition were to change or worsen  Farris Has, PA-C  03/01/2014, 2:34 PM

## 2014-03-02 LAB — GC/CHLAMYDIA PROBE AMP
CT Probe RNA: NEGATIVE
GC Probe RNA: NEGATIVE

## 2014-03-03 ENCOUNTER — Inpatient Hospital Stay (HOSPITAL_COMMUNITY)
Admission: AD | Admit: 2014-03-03 | Discharge: 2014-03-03 | Disposition: A | Discharge status: Home / Self Care | Payer: 59 | Source: Ambulatory Visit | Attending: Obstetrics and Gynecology | Admitting: Obstetrics and Gynecology

## 2014-03-03 ENCOUNTER — Encounter (HOSPITAL_COMMUNITY): Payer: Self-pay | Admitting: *Deleted

## 2014-03-03 DIAGNOSIS — J45909 Unspecified asthma, uncomplicated: Secondary | ICD-10-CM | POA: Insufficient documentation

## 2014-03-03 DIAGNOSIS — K219 Gastro-esophageal reflux disease without esophagitis: Secondary | ICD-10-CM | POA: Insufficient documentation

## 2014-03-03 DIAGNOSIS — F319 Bipolar disorder, unspecified: Secondary | ICD-10-CM | POA: Insufficient documentation

## 2014-03-03 DIAGNOSIS — F411 Generalized anxiety disorder: Secondary | ICD-10-CM | POA: Insufficient documentation

## 2014-03-03 DIAGNOSIS — N939 Abnormal uterine and vaginal bleeding, unspecified: Secondary | ICD-10-CM

## 2014-03-03 DIAGNOSIS — N898 Other specified noninflammatory disorders of vagina: Secondary | ICD-10-CM | POA: Insufficient documentation

## 2014-03-03 HISTORY — DX: Unspecified abnormal cytological findings in specimens from vagina: R87.629

## 2014-03-03 HISTORY — DX: Bipolar disorder, unspecified: F31.9

## 2014-03-03 HISTORY — DX: Anxiety disorder, unspecified: F41.9

## 2014-03-03 LAB — URINALYSIS, ROUTINE W REFLEX MICROSCOPIC
Glucose, UA: NEGATIVE mg/dL
Ketones, ur: NEGATIVE mg/dL
Leukocytes, UA: NEGATIVE
NITRITE: NEGATIVE
Protein, ur: 30 mg/dL — AB
Specific Gravity, Urine: >1.03 — ABNORMAL HIGH (ref 1.005–1.030)
UROBILINOGEN UA: 0.2 mg/dL (ref 0.0–1.0)
pH: 6 (ref 5.0–8.0)

## 2014-03-03 LAB — CBC
HEMATOCRIT: 36.6 % (ref 36.0–46.0)
Hemoglobin: 12.5 g/dL (ref 12.0–15.0)
MCH: 29.7 pg (ref 26.0–34.0)
MCHC: 34.2 g/dL (ref 30.0–36.0)
MCV: 86.9 fL (ref 78.0–100.0)
Platelets: 286 10*3/uL (ref 150–400)
RBC: 4.21 MIL/uL (ref 3.87–5.11)
RDW: 13.3 % (ref 11.5–15.5)
WBC: 4.9 10*3/uL (ref 4.0–10.5)

## 2014-03-03 LAB — URINE MICROSCOPIC-ADD ON

## 2014-03-03 LAB — POCT PREGNANCY, URINE: Preg Test, Ur: NEGATIVE

## 2014-03-03 MED ORDER — IBUPROFEN 600 MG PO TABS
600.0000 mg | ORAL_TABLET | Freq: Once | ORAL | Status: AC
  Administered 2014-03-03: 600 mg via ORAL
  Filled 2014-03-03: qty 1

## 2014-03-03 NOTE — MAU Note (Signed)
Was seen this past Sunday for the same symptoms; received an injection on Sun that "brought out her bipolar depression";heavy vaginal bleeding since her period on the 02-16-14; keeps saying that she feels disoriented;

## 2014-03-03 NOTE — MAU Note (Signed)
Had a cryo procedure 3-4 weeks ago and she has been having "problems ever since the procedure"; concerned that she is having internal bleeding;

## 2014-03-03 NOTE — MAU Provider Note (Signed)
History     CSN: [id number]  Arrival date and time: 03/03/14 5465   First Provider Initiated Contact with Patient 03/03/14 1003      Chief Complaint  Patient presents with  . Nausea  . Emesis  . Vaginal Bleeding  . Abdominal Cramping   HPI  [name] is a 23 y.o. female G0P0 who presents to MAU with complaints of vaginal bleeding, and "feeling off". The patient had cryotherapy 3 weeks ago and has had abnormal bleeding since then. The patient has a history of bipolar; pt is currently being seen at an office in [place] for bipolar management; last time she was seen was December. Pt feels that she was given something in MAU a few days ago and that is what called her "disorientation". No new stress other than a breakup with her boyfriend. Pt called her GYN Dr. To schedule an appointment, initially they told her they would see her Thursday, however called her back and told her they would need to cancel the appt due to office issues. Pt denies the thought of harm to herself or anyone else. Pt was informed that she received no type of narcotic in MAU, that she received Toradol only.   OB History   Grav Para Term Preterm Abortions TAB SAB Ect Mult Living   0               Past Medical History  Diagnosis Date  . Asthma   . GERD (gastroesophageal reflux disease)   . Headache(784.0)     Migraines  . Bipolar 1 disorder   . Anxiety   . Vaginal Pap smear, abnormal     Past Surgical History  Procedure Laterality Date  . Tonsilectomy, adenoidectomy, bilateral myringotomy and tubes    . Frenulectomy, lingual    . Colposcopy    . Cryotherapy      Family History  Problem Relation Age of Onset  . Bipolar disorder Father   . ADD / ADHD Father   . Anxiety disorder Father   . Diabetes Mellitus II Maternal Grandfather   . CAD Maternal Grandfather   . Depression Maternal Grandmother   . Diabetes Mellitus II Maternal Grandmother   . Hypertension Maternal Grandmother    . Hyperlipidemia Maternal Grandmother   . CAD Paternal Grandfather   . Alcohol abuse Paternal Grandmother   . Depression Paternal Grandmother   . CAD Paternal Grandmother     History  Substance Use Topics  . Smoking status: Never Smoker   . Smokeless tobacco: Never Used  . Alcohol Use: No     Comment: Last drink October. 2014.    Allergies:  Allergies  Allergen Reactions  . Azithromycin Other (See Comments)    Stevens-Johnsons  . Allegra [Fexofenadine Hcl] Hives and Nausea And Vomiting  . Diphenhydramine Hcl Other (See Comments)    Hallucinations  . Prozac [Fluoxetine Hcl] Other (See Comments)    Stimulates bi polar disorder  . Sprintec 28 [Norgestimate-Eth Estradiol] Nausea And Vomiting  . Tegretol [Carbamazepine] Other (See Comments)    Kathreen Cosier  . Amoxicillin-Pot Clavulanate Nausea And Vomiting and Rash  . Codeine Palpitations    Heart rate goes over 200bpm  . Latex Rash    Prescriptions prior to admission  Medication Sig Dispense Refill  . aspirin-sod bicarb-citric acid (ALKA-SELTZER) 325 MG TBEF tablet Take 325 mg by mouth every 6 (six) hours as needed (for cold symptoms).      . busPIRone (BUSPAR) 15 MG tablet  Take 15 mg by mouth daily.      [name] esomeprazole (NEXIUM) 40 MG capsule Take 40 mg by mouth at bedtime.       [name] ibuprofen (ADVIL,MOTRIN) 200 MG tablet Take 800 mg by mouth every 6 (six) hours as needed for mild pain or moderate pain.      [name] lamoTRIgine (LAMICTAL) 150 MG tablet Take 150 mg by mouth daily. Takes with 25 mg for total of 175 mg daily      . lamoTRIgine (LAMICTAL) 25 MG tablet Take 25 mg by mouth daily. Take with 150 mg for a total of 175 mg.      . metroNIDAZOLE (METROGEL) 0.75 % gel Apply 1 application topically at bedtime. Started 02/23/14 times 5 days.      . rizatriptan (MAXALT) 10 MG tablet Take 10 mg by mouth as needed for migraine. May repeat in 2 hours if needed       Results for orders placed during the hospital encounter of  03/03/14 (from the past 48 hour(s))  URINALYSIS, ROUTINE W REFLEX MICROSCOPIC     Status: Abnormal   Collection Time    03/03/14  9:32 AM      Result Value Ref Range   Color, Urine YELLOW  YELLOW   APPearance CLOUDY (*) CLEAR   Specific Gravity, Urine >1.030 (*) 1.005 - 1.030   pH 6.0  5.0 - 8.0   Glucose, UA NEGATIVE  NEGATIVE mg/dL   Hgb urine dipstick LARGE (*) NEGATIVE   Bilirubin Urine SMALL (*) NEGATIVE   Ketones, ur NEGATIVE  NEGATIVE mg/dL   Protein, ur 30 (*) NEGATIVE mg/dL   Urobilinogen, UA 0.2  0.0 - 1.0 mg/dL   Nitrite NEGATIVE  NEGATIVE   Leukocytes, UA NEGATIVE  NEGATIVE  URINE MICROSCOPIC-ADD ON     Status: None   Collection Time    03/03/14  9:32 AM      Result Value Ref Range   Squamous Epithelial / LPF RARE  RARE   RBC / HPF TOO NUMEROUS TO COUNT  <3 RBC/hpf  POCT PREGNANCY, URINE     Status: None   Collection Time    03/03/14  9:39 AM      Result Value Ref Range   Preg Test, Ur NEGATIVE  NEGATIVE   Comment:            THE SENSITIVITY OF THIS     METHODOLOGY IS >24 mIU/mL  CBC     Status: None   Collection Time    03/03/14 10:45 AM      Result Value Ref Range   WBC 4.9  4.0 - 10.5 K/uL   RBC 4.21  3.87 - 5.11 MIL/uL   Hemoglobin 12.5  12.0 - 15.0 g/dL   HCT 36.6  36.0 - 46.0 %   MCV 86.9  78.0 - 100.0 fL   MCH 29.7  26.0 - 34.0 pg   MCHC 34.2  30.0 - 36.0 g/dL   RDW 13.3  11.5 - 15.5 %   Platelets 286  150 - 400 K/uL    Review of Systems  Constitutional: Positive for malaise/fatigue. Negative for fever and chills.  Gastrointestinal: Positive for abdominal pain (+Bilateral lower abdominal cramping ).  Genitourinary:       No vaginal discharge. + vaginal bleeding; heavy at times. Alternates from heavy to light.  No dysuria.   Neurological: Positive for weakness. Negative for sensory change, speech change and loss of consciousness.  Psychiatric/Behavioral: Negative for depression, suicidal  ideas, hallucinations and substance abuse. The patient is  not nervous/anxious.    Physical Exam   Blood pressure 113/74, pulse 74, temperature 98.4 F (36.9 C), temperature source Oral, resp. rate 18, height 5' (1.524 m), weight 53.978 kg (119 lb), last menstrual period 02/16/2014.  Physical Exam  Constitutional: She is oriented to person, place, and time. She appears well-developed and well-nourished. No distress.  HENT:  Head: Normocephalic.  Eyes: Pupils are equal, round, and reactive to light.  Neck: Neck supple.  Respiratory: Effort normal.  GI: Soft. She exhibits no distension. There is no tenderness. There is no rebound.  Genitourinary:  Speculum exam: Vagina - Small amount of dark red blood in vaginal canal, no odor Cervix - Scant bleeding from os.  Bimanual exam: Cervix closed, mild CMT  Uterus non tender, normal size Adnexa non tender, no masses bilaterally Chaperone present for exam.   Neurological: She is alert and oriented to person, place, and time. She has normal strength. She is not disoriented. GCS eye subscore is 4. GCS verbal subscore is 5. GCS motor subscore is 6.  Skin: Skin is warm. She is not diaphoretic.  Psychiatric: Her behavior is normal.    MAU Course  Procedures None  MDM CBC Pelvic exam Ibuprofen 600 mg PO given in MAU  Spoke to Dr. Radene Knee; this is normal for post cryo, pt is to be highly encouraged to call the office and schedule an appointment in the office to be seen.   Assessment and Plan   A:  1. Abnormal vaginal bleeding    P:  Discharge home in stable condition Pt is to call and schedule an appointment with the office this week or next. Ok to take ibuprofen as needed, as directed on the bottle Bleeding precautions discussed   Darrelyn Hillock Rasch, NP 03/03/2014, 2:07 PM

## 2014-03-03 NOTE — Discharge Instructions (Signed)

## 2014-05-01 ENCOUNTER — Encounter (HOSPITAL_COMMUNITY): Payer: Self-pay | Admitting: Psychiatry

## 2014-05-01 ENCOUNTER — Ambulatory Visit (INDEPENDENT_AMBULATORY_CARE_PROVIDER_SITE_OTHER): Payer: 59 | Admitting: Psychiatry

## 2014-05-01 ENCOUNTER — Encounter (INDEPENDENT_AMBULATORY_CARE_PROVIDER_SITE_OTHER): Payer: Self-pay

## 2014-05-01 VITALS — Ht 60.0 in | Wt 121.0 lb

## 2014-05-01 DIAGNOSIS — F319 Bipolar disorder, unspecified: Secondary | ICD-10-CM

## 2014-05-01 DIAGNOSIS — F3130 Bipolar disorder, current episode depressed, mild or moderate severity, unspecified: Secondary | ICD-10-CM

## 2014-05-01 MED ORDER — LAMOTRIGINE 200 MG PO TABS: 200.0000 mg | ORAL_TABLET | Freq: Every day | ORAL | Status: DC

## 2014-05-01 MED ORDER — BUSPIRONE HCL 15 MG PO TABS: 15.0000 mg | ORAL_TABLET | Freq: Every day | ORAL | Status: DC

## 2014-05-01 NOTE — Progress Notes (Signed)
Patient ID: Whitney Gonzalez, female   DOB: 11/12/1991, 23 y.o.   MRN: [id number]  [facility] [address] Progress Note  PANTERA WINTERROWD [id number] 22 y.o.  05/01/2014 10:58 AM  Chief Complaint:  HPI Comments: Whitney Gonzalez  a 23 y/o female with a past psychiatric history significant for ADHD and Bipolar I Disorder. The patient is referred for psychiatric services for psychiatric evaluation and medication management.   Continues to take meds but forget the 25mg  at times. Wants to increase to 200 as originally planned. No rash.  No headaches on buspar. Says trazadone started by primary care for sleep. Has been doing better. Lives with her Mom. Says Bipolar runs in the family and alcoholism.  She does not drink regularly. . Location: The patient reports she has been doing relatively well with some minor stressors. . Quality:  The patient reports that her main stressors are:  -her "grandmother's health"-she is now in hospice. -"boyfriend"- his legal issues and substance abuse issues.  In the area of affective symptoms, patient appears euthymic. Patient denies current suicidal ideation, intent, or plan. Patient denies current homicidal ideation, intent, or plan. Patient denies auditory hallucinations. Patient denies visual hallucinations. Patient denies symptoms of paranoia. Patient states sleep is good, with approximately 6-8 hours of sleep per night. Appetite is good. Energy level is good. Patient endorses/denies symptoms of anhedonia. Patient endorses/denies hopelessness, helplessness, or guilt.  . Severity: Depression: 7/10 (0=Very depressed; 5=Neutral; 10=Very Happy)  Anxiety- 5/10 (0=no anxiety; 5= moderate/tolerable anxiety; 10= panic attacks)  . Duration- Since the age of 59.   . Timing: No specific timing.  . Context: Familial Stressors- with father, grandmother's health.   . Modifying factors: Improves with breathing techniques, walking, reading and shopping.    . Associated signs and symptoms: Denies any recent episodes consistent with mania, particularly decreased need for sleep with increased energy, grandiosity, impulsivity, hyperverbal and pressured speech, or increased productivity. Denies any recent symptoms consistent with psychosis, particularly auditory or visual hallucinations, thought broadcasting/insertion/withdrawal, or ideas of reference. Also denies excessive worry to the point of physical symptoms as well as any panic attacks. Denies any history of trauma or symptoms consistent with PTSD such as flashbacks, nightmares, hypervigilance, feelings of numbness or inability to connect with others.    History of Present Illness: Suicidal Ideation: Negative Plan Formed: Negative Patient has means to carry out plan: Negative  Homicidal Ideation: Negative Plan Formed: Negative Patient has means to carry out plan: Negative  Review of Systems: Psychiatric: Agitation: Negative Hallucination: Negative Depressed Mood: Negative Insomnia: Negative Hypersomnia: Negative Altered Concentration: Negative Feels Worthless: Negative Grandiose Ideas: Negative Belief In Special Powers: Negative New/Increased Substance Abuse: Negative Compulsions: Negative  Neurologic: Headache: Negative Seizure: Negative Paresthesias: Negative  Past Medical Family, Social History:  Past Medical History  Diagnosis Date  . Asthma   . GERD (gastroesophageal reflux disease)   . Headache(784.0)     Migraines  . Bipolar 1 disorder   . Anxiety   . Vaginal Pap smear, abnormal    Family History  Problem Relation Age of Onset  . Bipolar disorder Father   . ADD / ADHD Father   . Anxiety disorder Father   . Diabetes Mellitus II Maternal Grandfather   . CAD Maternal Grandfather   . Depression Maternal Grandmother   . Diabetes Mellitus II Maternal Grandmother   . Hypertension Maternal Grandmother   . Hyperlipidemia Maternal Grandmother   . CAD Paternal  Grandfather   . Alcohol abuse Paternal Grandmother   .  Depression Paternal Grandmother   . CAD Paternal Grandmother     History   Social History  . Marital Status: Single    Spouse Name: N/A    Number of Children: N/A  . Years of Education: N/A   Social History Main Topics  . Smoking status: Never Smoker   . Smokeless tobacco: Never Used  . Alcohol Use: No     Comment: Last drink October. 2014.  . Drug Use: No  . Sexual Activity: Not Currently   Other Topics Concern  . None   Social History Narrative  . None     Outpatient Encounter Prescriptions as of 05/01/2014  Medication Sig  . aspirin-sod bicarb-citric acid (ALKA-SELTZER) 325 MG TBEF tablet Take 325 mg by mouth every 6 (six) hours as needed (for cold symptoms).  . busPIRone (BUSPAR) 15 MG tablet Take 1 tablet (15 mg total) by mouth daily.  [name] esomeprazole (NEXIUM) 40 MG capsule Take 40 mg by mouth at bedtime.   [name] ibuprofen (ADVIL,MOTRIN) 200 MG tablet Take 800 mg by mouth every 6 (six) hours as needed for mild pain or moderate pain.  [name] lamoTRIgine (LAMICTAL) 200 MG tablet Take 1 tablet (200 mg total) by mouth daily. Takes with 25 mg for total of 175 mg daily  . metroNIDAZOLE (METROGEL) 0.75 % gel Apply 1 application topically at bedtime. Started 02/23/14 times 5 days.  . rizatriptan (MAXALT) 10 MG tablet Take 10 mg by mouth as needed for migraine. May repeat in 2 hours if needed  . [DISCONTINUED] busPIRone (BUSPAR) 15 MG tablet Take 15 mg by mouth daily.  . [DISCONTINUED] lamoTRIgine (LAMICTAL) 150 MG tablet Take 150 mg by mouth daily. Takes with 25 mg for total of 175 mg daily  . [DISCONTINUED] lamoTRIgine (LAMICTAL) 25 MG tablet Take 25 mg by mouth daily. Take with 150 mg for a total of 175 mg.    Past Psychiatric History/Hospitalization(s): Anxiety: Negative Bipolar Disorder: Yes Depression: Yes Mania: Negative Psychosis: Negative Schizophrenia: Negative Personality Disorder: Negative Hospitalization for  psychiatric illness: Yes History of Electroconvulsive Shock Therapy: Negative Prior Suicide Attempts: No  Physical Exam: Review of Systems  Constitutional: Negative for fever, chills and weight loss.  Respiratory: Negative for cough, hemoptysis, sputum production and shortness of breath.   Cardiovascular: Negative for chest pain, palpitations and leg swelling.  Gastrointestinal: Negative for heartburn, nausea, vomiting, abdominal pain, diarrhea and constipation.  Genitourinary: Negative for dysuria and urgency.  Skin: Negative for itching and rash.  Neurological: Negative for dizziness, tingling, tremors and loss of consciousness.    Constitutional:  Ht 5' (1.524 m)  Wt 121 lb (54.885 kg)  BMI 23.63 kg/m2  General Appearance: alert, oriented, no acute distress and well nourished  Musculoskeletal: Strength & Muscle Tone: within normal limits Gait & Station: normal Patient leans: N/A  Psychiatric: General Appearance: Casual and Well Groomed  Engineer, water::  Good  Speech:  Clear and Coherent and Normal Rate  Volume:  Normal  Mood:  "pretty good."  Affect:  Appropriate, Congruent and Full Range  Thought Process:  Coherent, Goal Directed, Linear and Logical  Orientation:  Full (Time, Place, and Person)  Thought Content:  WDL  Suicidal Thoughts:  No  Homicidal Thoughts:  No  Memory:  Immediate;   Fair Recent;   Fair Remote;   Good  Judgement:  Good  Insight:  Good  Psychomotor Activity:  Normal  Concentration:  Fair  Recall:  Good  Akathisia:  No  Handed:  Right  AIMS (  if indicated):  Not indicated   Assets:  Communication Skills Desire for Improvement Financial Resources/Insurance Housing Intimacy Leisure Time Physical Health Resilience Social Support Building surveyor (Choose Three): Review of Psycho-Social Stressors (1), Review and summation of old records (2), Established Problem, Worsening (2), Review of Medication  Regimen & Side Effects (2) and Review of New Medication or Change in Dosage (2)  Assessment: Axis I: Bipolar I Disorder, most recent episode depressed, without psychotic features.   Plan:   Plan of Care:  PLAN:  1. Affirm with the patient that the medications are taken as ordered. Patient  expressed understanding of how their medications were to be used.    Laboratory: No labs warranted at this time.    Psychotherapy: Therapy: brief supportive therapy provided.  Discussed psychosocial stressors in detail.   Medications:  Continue the following psychiatric medications as written prior to this appointment with the following changes::  a) busPIRone (BUSPAR) 15 MG tablet-Take 1 tablet (15 mg total) by mouth daily. B) lamoTRIgine (LAMICTAL) increase to 200mg  qhs.  -Risks and benefits, side effects and alternatives discussed with patient, she was given an opportunity to ask questions about his/her medication, illness, and treatment. All current psychiatric medications have been reviewed and discussed with the patient and adjusted as clinically appropriate. The patient has been provided an accurate and updated list of the medications being now prescribed.   Routine PRN Medications:  Negative  Consultations: The patient was encouraged to keep all PCP and specialty clinic appointments.   Safety Concerns:   Patient told to call clinic if any problems occur. Patient advised to go to  ER  if she should develop SI/HI, side effects, or if symptoms worsen. Has crisis numbers to call if needed.    Other:   Cautioned about headaces with trazadone and buspar combine. Cautioned about rash on lamictal.  8. Patient was instructed to return to clinic in 1 month.  9. The patient was advised to call and cancel their mental health appointment within 24 hours of the appointment, if they are unable to keep the appointment, as well as the three no show and termination from clinic policy. 10. The patient expressed  understanding of the plan and agrees with the above.  Time Spent: 25 minutes  Merian Capron, MD 05/01/2014

## 2014-05-26 ENCOUNTER — Ambulatory Visit (HOSPITAL_COMMUNITY): Payer: Self-pay | Admitting: Psychiatry

## 2014-06-30 ENCOUNTER — Other Ambulatory Visit: Payer: Self-pay | Admitting: Obstetrics and Gynecology

## 2014-07-01 LAB — CYTOLOGY - PAP

## 2015-02-10 ENCOUNTER — Encounter (HOSPITAL_COMMUNITY): Payer: Self-pay | Admitting: *Deleted

## 2015-02-10 ENCOUNTER — Inpatient Hospital Stay (HOSPITAL_COMMUNITY)
Admission: AD | Admit: 2015-02-10 | Discharge: 2015-02-10 | Disposition: A | Discharge status: Home / Self Care | Payer: 59 | Source: Ambulatory Visit | Attending: Obstetrics and Gynecology | Admitting: Obstetrics and Gynecology

## 2015-02-10 DIAGNOSIS — Z711 Person with feared health complaint in whom no diagnosis is made: Secondary | ICD-10-CM

## 2015-02-10 DIAGNOSIS — N939 Abnormal uterine and vaginal bleeding, unspecified: Secondary | ICD-10-CM | POA: Diagnosis not present

## 2015-02-10 DIAGNOSIS — Z3202 Encounter for pregnancy test, result negative: Secondary | ICD-10-CM | POA: Insufficient documentation

## 2015-02-10 DIAGNOSIS — F319 Bipolar disorder, unspecified: Secondary | ICD-10-CM | POA: Diagnosis not present

## 2015-02-10 LAB — CBC
HCT: 37.4 % (ref 36.0–46.0)
Hemoglobin: 13.1 g/dL (ref 12.0–15.0)
MCH: 30.7 pg (ref 26.0–34.0)
MCHC: 35 g/dL (ref 30.0–36.0)
MCV: 87.6 fL (ref 78.0–100.0)
PLATELETS: 260 10*3/uL (ref 150–400)
RBC: 4.27 MIL/uL (ref 3.87–5.11)
RDW: 12.6 % (ref 11.5–15.5)
WBC: 6.8 10*3/uL (ref 4.0–10.5)

## 2015-02-10 LAB — URINALYSIS, ROUTINE W REFLEX MICROSCOPIC
Bilirubin Urine: NEGATIVE
Glucose, UA: NEGATIVE mg/dL
Ketones, ur: NEGATIVE mg/dL
Leukocytes, UA: NEGATIVE
NITRITE: NEGATIVE
PH: 5.5 (ref 5.0–8.0)
Protein, ur: 30 mg/dL — AB
SPECIFIC GRAVITY, URINE: 1.01 (ref 1.005–1.030)
UROBILINOGEN UA: 0.2 mg/dL (ref 0.0–1.0)

## 2015-02-10 LAB — URINE MICROSCOPIC-ADD ON

## 2015-02-10 LAB — RAPID URINE DRUG SCREEN, HOSP PERFORMED
Amphetamines: NOT DETECTED
BARBITURATES: NOT DETECTED
BENZODIAZEPINES: NOT DETECTED
Cocaine: NOT DETECTED
OPIATES: NOT DETECTED
TETRAHYDROCANNABINOL: NOT DETECTED

## 2015-02-10 LAB — WET PREP, GENITAL
Clue Cells Wet Prep HPF POC: NONE SEEN
Trich, Wet Prep: NONE SEEN
Yeast Wet Prep HPF POC: NONE SEEN

## 2015-02-10 LAB — HCG, QUANTITATIVE, PREGNANCY: hCG, Beta Chain, Quant, S: 2 m[IU]/mL (ref <5)

## 2015-02-10 LAB — POCT PREGNANCY, URINE: Preg Test, Ur: NEGATIVE

## 2015-02-10 NOTE — Discharge Instructions (Signed)
Miscarriage A miscarriage is the loss of an unborn baby (fetus) before the 20th week of pregnancy. The cause is often unknown.  HOME CARE  You may need to stay in bed (bed rest), or you may be able to do light activity. Go about activity as told by your doctor.  Have help at home.  Write down how many pads you use each day. Write down how soaked they are.  Do not use tampons. Do not wash out your vagina (douche) or have sex (intercourse) until your doctor approves.  Only take medicine as told by your doctor.  Do not take aspirin.  Keep all doctor visits as told.  If you or your partner have problems with grieving, talk to your doctor. You can also try counseling. Give yourself time to grieve before trying to get pregnant again. GET HELP RIGHT AWAY IF:  You have bad cramps or pain in your back or belly (abdomen).  You have a fever.  You pass large clumps of blood (clots) from your vagina that are walnut-sized or larger. Save the clumps for your doctor to see.  You pass large amounts of tissue from your vagina. Save the tissue for your doctor to see.  You have more bleeding.  You have thick, bad-smelling fluid (discharge) coming from the vagina.  You get lightheaded, weak, or you pass out (faint).  You have chills. MAKE SURE YOU:  Understand these instructions.  Will watch your condition.  Will get help right away if you are not doing well or get worse. Document Released: 02/12/2012 Document Reviewed: 02/12/2012 [facility] Patient Information 2015 [place]. This information is not intended to replace advice given to you by your health care provider. Make sure you discuss any questions you have with your health care provider.

## 2015-02-10 NOTE — MAU Note (Addendum)
preg confirmed at [facility] last wk.  ? LMP, was bleeding on vacation.  Thinks she had implantation bleeding 23/24; nipples have been really sensitive. +HPT. Started bleeding today. Started as dark brown, became red and has passed some clots. No cramping.

## 2015-02-10 NOTE — MAU Provider Note (Signed)
History     CSN: [id number]  Arrival date and time: 02/10/15 5277   First Provider Initiated Contact with Patient 02/10/15 1846      Chief Complaint  Patient presents with  . Vaginal Bleeding  . Possible Pregnancy   HPI Comments: Whitney Gonzalez 24 y.o. G1P0 Unknown LMP or gestational age presents to MAU with vaginal bleeding and positive pregnancy tests from home and MD office. She went to [facility] and had " blood test" and was told she was definitely pregnant. She is due for her menses on the 13th of this month. She is Bipolar on medications and has many allergies.   Vaginal Bleeding  Possible Pregnancy      Past Medical History  Diagnosis Date  . Asthma   . GERD (gastroesophageal reflux disease)   . Headache(784.0)     Migraines  . Bipolar 1 disorder   . Anxiety   . Vaginal Pap smear, abnormal     Past Surgical History  Procedure Laterality Date  . Tonsilectomy, adenoidectomy, bilateral myringotomy and tubes    . Frenulectomy, lingual    . Colposcopy    . Cryotherapy      Family History  Problem Relation Age of Onset  . Bipolar disorder Father   . ADD / ADHD Father   . Anxiety disorder Father   . Diabetes Mellitus II Maternal Grandfather   . CAD Maternal Grandfather   . Depression Maternal Grandmother   . Diabetes Mellitus II Maternal Grandmother   . Hypertension Maternal Grandmother   . Hyperlipidemia Maternal Grandmother   . CAD Paternal Grandfather   . Alcohol abuse Paternal Grandmother   . Depression Paternal Grandmother   . CAD Paternal Grandmother     History  Substance Use Topics  . Smoking status: Never Smoker   . Smokeless tobacco: Never Used  . Alcohol Use: No     Comment: Last drink October. 2014.    Allergies:  Allergies  Allergen Reactions  . Azithromycin Other (See Comments)    Stevens-Johnsons  . Allegra [Fexofenadine Hcl] Hives and Nausea And Vomiting  . Prozac [Fluoxetine Hcl] Other (See Comments)    Stimulates bi polar  disorder  . Sprintec 28 [Norgestimate-Eth Estradiol] Nausea And Vomiting  . Tegretol [Carbamazepine] Other (See Comments)    Kathreen Cosier  . Toradol [Ketorolac Tromethamine] Other (See Comments)    Stimulates Bi-Polar disorder  . Amoxicillin-Pot Clavulanate Nausea And Vomiting and Rash  . Codeine Palpitations    Heart rate goes over 200bpm  . Latex Rash  . Penicillins Rash    Prescriptions prior to admission  Medication Sig Dispense Refill Last Dose  . aspirin-sod bicarb-citric acid (ALKA-SELTZER) 325 MG TBEF tablet Take 325 mg by mouth every 6 (six) hours as needed (for cold symptoms).   more than one month  . busPIRone (BUSPAR) 15 MG tablet Take 1 tablet (15 mg total) by mouth daily. (Patient not taking: Reported on 02/10/2015) 30 tablet 1   . ibuprofen (ADVIL,MOTRIN) 200 MG tablet Take 800 mg by mouth every 6 (six) hours as needed for mild pain or moderate pain.   more than one month  . lamoTRIgine (LAMICTAL) 200 MG tablet Take 1 tablet (200 mg total) by mouth daily. Takes with 25 mg for total of 175 mg daily (Patient not taking: Reported on 02/10/2015) 30 tablet 1   . Prenatal Vit-Fe Fumarate-FA (PRENATAL MULTIVITAMIN) TABS tablet Take 1 tablet by mouth daily at 12 noon.   02/10/2015 at Unknown time  .  rizatriptan (MAXALT) 10 MG tablet Take 10 mg by mouth as needed for migraine. May repeat in 2 hours if needed   more than one month    Review of Systems  Constitutional: Negative.   HENT: Negative.   Eyes: Negative.   Respiratory: Negative.   Cardiovascular: Negative.   Gastrointestinal: Negative.   Genitourinary: Positive for vaginal bleeding.       Vaginal bleeding  Musculoskeletal: Negative.   Skin: Negative.   Neurological: Negative.   Psychiatric/Behavioral: The patient is nervous/anxious.    Physical Exam   Blood pressure 139/87, pulse 87, temperature 99 F (37.2 C), temperature source Oral, resp. rate 18, height 5\' 1"  (1.549 m), weight 58.968 kg (130 lb), last menstrual  period 01/16/2015.  Physical Exam  Constitutional: She is oriented to person, place, and time. She appears well-developed and well-nourished. No distress.  HENT:  Head: Normocephalic and atraumatic.  Eyes: Pupils are equal, round, and reactive to light.  GI: Soft. She exhibits no distension. There is no tenderness. There is no rebound and no guarding.  Genitourinary:  Genital:external bloody Vaginal:small amount bright red blood Cervix:closed/ thick Bimanual:nontender   Musculoskeletal: Normal range of motion.  Neurological: She is alert and oriented to person, place, and time.  Skin: Skin is warm and dry.  Psychiatric: She has a normal mood and affect. Her behavior is normal. Judgment and thought content normal.   Results for orders placed or performed during the hospital encounter of 02/10/15 (from the past 24 hour(s))  Urinalysis, Routine w reflex microscopic     Status: Abnormal   Collection Time: 02/10/15  6:30 PM  Result Value Ref Range   Color, Urine RED (A) YELLOW   APPearance HAZY (A) CLEAR   Specific Gravity, Urine 1.010 1.005 - 1.030   pH 5.5 5.0 - 8.0   Glucose, UA NEGATIVE NEGATIVE mg/dL   Hgb urine dipstick LARGE (A) NEGATIVE   Bilirubin Urine NEGATIVE NEGATIVE   Ketones, ur NEGATIVE NEGATIVE mg/dL   Protein, ur 30 (A) NEGATIVE mg/dL   Urobilinogen, UA 0.2 0.0 - 1.0 mg/dL   Nitrite NEGATIVE NEGATIVE   Leukocytes, UA NEGATIVE NEGATIVE  Urine microscopic-add on     Status: None   Collection Time: 02/10/15  6:30 PM  Result Value Ref Range   Squamous Epithelial / LPF RARE RARE   WBC, UA 0-2 <3 WBC/hpf   RBC / HPF 7-10 <3 RBC/hpf   Bacteria, UA RARE RARE  Drug screen panel, emergency     Status: None   Collection Time: 02/10/15  6:30 PM  Result Value Ref Range   Opiates NONE DETECTED NONE DETECTED   Cocaine NONE DETECTED NONE DETECTED   Benzodiazepines NONE DETECTED NONE DETECTED   Amphetamines NONE DETECTED NONE DETECTED   Tetrahydrocannabinol NONE DETECTED  NONE DETECTED   Barbiturates NONE DETECTED NONE DETECTED  Pregnancy, urine POC     Status: None   Collection Time: 02/10/15  6:34 PM  Result Value Ref Range   Preg Test, Ur NEGATIVE NEGATIVE  Wet prep, genital     Status: Abnormal   Collection Time: 02/10/15  6:55 PM  Result Value Ref Range   Yeast Wet Prep HPF POC NONE SEEN NONE SEEN   Trich, Wet Prep NONE SEEN NONE SEEN   Clue Cells Wet Prep HPF POC NONE SEEN NONE SEEN   WBC, Wet Prep HPF POC FEW (A) NONE SEEN  hCG, quantitative, pregnancy     Status: None   Collection Time: 02/10/15  7:00  PM  Result Value Ref Range   hCG, Beta Chain, Quant, S 2 <5 mIU/mL  ABO/Rh     Status: None (Preliminary result)   Collection Time: 02/10/15  7:00 PM  Result Value Ref Range   ABO/RH(D) O POS   CBC     Status: None   Collection Time: 02/10/15  7:00 PM  Result Value Ref Range   WBC 6.8 4.0 - 10.5 K/uL   RBC 4.27 3.87 - 5.11 MIL/uL   Hemoglobin 13.1 12.0 - 15.0 g/dL   HCT 37.4 36.0 - 46.0 %   MCV 87.6 78.0 - 100.0 fL   MCH 30.7 26.0 - 34.0 pg   MCHC 35.0 30.0 - 36.0 g/dL   RDW 12.6 11.5 - 15.5 %   Platelets 260 150 - 400 K/uL    MAU Course  Procedures  MDM Wet prep, GC, Chlamydia, CBC, UA, ABORh, Quant, HIV Spoke with Dr Radene Knee and reviewed labs and he advised she follow up with Dr Runell Gess on Friday  Assessment and Plan   A: Possible miscarriage  P: Advised to follow with Dr Runell Gess on Friday May return to MAU as needed  Georgia Duff 02/10/2015, 8:23 PM

## 2015-02-11 LAB — HIV ANTIBODY (ROUTINE TESTING W REFLEX): HIV Screen 4th Generation wRfx: NONREACTIVE

## 2015-02-11 LAB — ABO/RH: ABO/RH(D): O POS

## 2015-02-11 LAB — GC/CHLAMYDIA PROBE AMP (~~LOC~~) NOT AT ARMC
CHLAMYDIA, DNA PROBE: NEGATIVE
Neisseria Gonorrhea: NEGATIVE

## 2015-07-28 ENCOUNTER — Ambulatory Visit
Admission: RE | Admit: 2015-07-28 | Discharge: 2015-07-28 | Disposition: A | Discharge status: Home / Self Care | Payer: 59 | Source: Ambulatory Visit | Attending: Nurse Practitioner | Admitting: Nurse Practitioner

## 2015-07-28 ENCOUNTER — Other Ambulatory Visit: Payer: Self-pay | Admitting: Nurse Practitioner

## 2015-07-28 DIAGNOSIS — N6459 Other signs and symptoms in breast: Secondary | ICD-10-CM

## 2015-07-28 DIAGNOSIS — N644 Mastodynia: Secondary | ICD-10-CM

## 2015-12-05 DIAGNOSIS — A749 Chlamydial infection, unspecified: Secondary | ICD-10-CM

## 2015-12-05 HISTORY — DX: Chlamydial infection, unspecified: A74.9

## 2015-12-15 DIAGNOSIS — N760 Acute vaginitis: Secondary | ICD-10-CM | POA: Diagnosis not present

## 2015-12-15 DIAGNOSIS — N390 Urinary tract infection, site not specified: Secondary | ICD-10-CM | POA: Diagnosis not present

## 2015-12-15 MED FILL — NITROFURANTOIN MONO-MCR 100: 100 | 7 days supply | Qty: 14 | Fill #0

## 2015-12-16 MED FILL — metroNIDAZOLE 0.75 % GEL: 0.75 | 5 days supply | Qty: 70 | Fill #0

## 2016-01-10 DIAGNOSIS — B373 Candidiasis of vulva and vagina: Secondary | ICD-10-CM | POA: Diagnosis not present

## 2016-01-10 MED FILL — FLUCONAZOLE 150 MG TABLET: 150 | 9 days supply | Qty: 3 | Fill #0

## 2016-01-19 DIAGNOSIS — J029 Acute pharyngitis, unspecified: Secondary | ICD-10-CM | POA: Diagnosis not present

## 2016-01-19 DIAGNOSIS — R52 Pain, unspecified: Secondary | ICD-10-CM | POA: Diagnosis not present

## 2016-03-01 DIAGNOSIS — R509 Fever, unspecified: Secondary | ICD-10-CM | POA: Diagnosis not present

## 2016-03-01 DIAGNOSIS — R5383 Other fatigue: Secondary | ICD-10-CM | POA: Diagnosis not present

## 2016-03-09 DIAGNOSIS — N926 Irregular menstruation, unspecified: Secondary | ICD-10-CM | POA: Diagnosis not present

## 2016-03-09 DIAGNOSIS — R5383 Other fatigue: Secondary | ICD-10-CM | POA: Diagnosis not present

## 2016-03-09 DIAGNOSIS — R509 Fever, unspecified: Secondary | ICD-10-CM | POA: Diagnosis not present

## 2016-03-16 DIAGNOSIS — B373 Candidiasis of vulva and vagina: Secondary | ICD-10-CM | POA: Diagnosis not present

## 2016-03-16 DIAGNOSIS — R300 Dysuria: Secondary | ICD-10-CM | POA: Diagnosis not present

## 2016-04-18 DIAGNOSIS — M25561 Pain in right knee: Secondary | ICD-10-CM | POA: Diagnosis not present

## 2016-04-18 DIAGNOSIS — J069 Acute upper respiratory infection, unspecified: Secondary | ICD-10-CM | POA: Diagnosis not present

## 2016-05-03 DIAGNOSIS — N760 Acute vaginitis: Secondary | ICD-10-CM | POA: Diagnosis not present

## 2016-05-08 DIAGNOSIS — R3915 Urgency of urination: Secondary | ICD-10-CM | POA: Diagnosis not present

## 2016-05-08 DIAGNOSIS — N3000 Acute cystitis without hematuria: Secondary | ICD-10-CM | POA: Diagnosis not present

## 2016-05-08 MED FILL — CIPROFLOXACIN HCL 500 MG TA: 500 | 7 days supply | Qty: 14 | Fill #0

## 2016-05-08 MED FILL — FLUCONAZOLE 150 MG TABLET: 150 | 5 days supply | Qty: 2 | Fill #0

## 2016-05-09 DIAGNOSIS — N939 Abnormal uterine and vaginal bleeding, unspecified: Secondary | ICD-10-CM | POA: Diagnosis not present

## 2016-05-09 DIAGNOSIS — N390 Urinary tract infection, site not specified: Secondary | ICD-10-CM | POA: Diagnosis not present

## 2016-07-08 ENCOUNTER — Encounter: Payer: Self-pay | Admitting: Emergency Medicine

## 2016-07-08 ENCOUNTER — Emergency Department
Admission: EM | Admit: 2016-07-08 | Discharge: 2016-07-08 | Disposition: A | Discharge status: Home / Self Care | Payer: 59 | Source: Home / Self Care | Attending: Emergency Medicine | Admitting: Emergency Medicine

## 2016-07-08 DIAGNOSIS — J029 Acute pharyngitis, unspecified: Secondary | ICD-10-CM | POA: Diagnosis not present

## 2016-07-08 LAB — POCT RAPID STREP A (OFFICE): Rapid Strep A Screen: NEGATIVE

## 2016-07-08 NOTE — ED Provider Notes (Signed)
CSN: [id number]     Arrival date & time 07/08/16  1404 History   First MD Initiated Contact with Patient 07/08/16 1439     Chief Complaint  Patient presents with  . Sore Throat   (Consider location/radiation/quality/duration/timing/severity/associated sxs/prior Treatment) Pt reports she has had a sore throat for 2 days.  Pt missed work last pm.  Pt wants to be checked for strep   The history is provided by the patient.  Sore Throat  This is a new problem. The current episode started 2 days ago. The problem occurs constantly. The problem has been gradually improving. Pertinent negatives include no headaches. Nothing aggravates the symptoms. Nothing relieves the symptoms. She has tried nothing for the symptoms. The treatment provided no relief.    Past Medical History:  Diagnosis Date  . Anxiety   . Asthma   . Bipolar 1 disorder ([place])   . GERD (gastroesophageal reflux disease)   . Headache(784.0)    Migraines  . Vaginal Pap smear, abnormal    Past Surgical History:  Procedure Laterality Date  . COLPOSCOPY    . CRYOTHERAPY    . FRENULECTOMY, LINGUAL    . TONSILECTOMY, ADENOIDECTOMY, BILATERAL MYRINGOTOMY AND TUBES     Family History  Problem Relation Age of Onset  . Bipolar disorder Father   . ADD / ADHD Father   . Anxiety disorder Father   . Diabetes Mellitus II Maternal Grandfather   . CAD Maternal Grandfather   . Depression Maternal Grandmother   . Diabetes Mellitus II Maternal Grandmother   . Hypertension Maternal Grandmother   . Hyperlipidemia Maternal Grandmother   . CAD Paternal Grandfather   . Alcohol abuse Paternal Grandmother   . Depression Paternal Grandmother   . CAD Paternal Grandmother    Social History  Substance Use Topics  . Smoking status: Never Smoker  . Smokeless tobacco: Never Used  . Alcohol use No     Comment: Last drink October. 2014.   OB History    Gravida Para Term Preterm AB Living   1             SAB TAB Ectopic Multiple Live Births                  Review of Systems  HENT: Positive for sore throat.   Neurological: Negative for headaches.  All other systems reviewed and are negative.   Allergies  Azithromycin; Allegra [fexofenadine hcl]; Prozac [fluoxetine hcl]; Sprintec 28 [norgestimate-eth estradiol]; Tegretol [carbamazepine]; Toradol [ketorolac tromethamine]; Amoxicillin-pot clavulanate; Codeine; Latex; and Penicillins  Home Medications   Prior to Admission medications   Medication Sig Start Date End Date Taking? Authorizing Provider  busPIRone (BUSPAR) 15 MG tablet Take 1 tablet (15 mg total) by mouth daily. 05/01/14  Yes Merian Capron, MD  aspirin-sod bicarb-citric acid (ALKA-SELTZER) 325 MG TBEF tablet Take 325 mg by mouth every 6 (six) hours as needed (for cold symptoms).    Historical Provider, MD  ibuprofen (ADVIL,MOTRIN) 200 MG tablet Take 800 mg by mouth every 6 (six) hours as needed for mild pain or moderate pain.    Historical Provider, MD  lamoTRIgine (LAMICTAL) 200 MG tablet Take 1 tablet (200 mg total) by mouth daily. Takes with 25 mg for total of 175 mg daily Patient not taking: Reported on 02/10/2015 05/01/14   Merian Capron, MD  Prenatal Vit-Fe Fumarate-FA (PRENATAL MULTIVITAMIN) TABS tablet Take 1 tablet by mouth daily at 12 noon.    Historical Provider, MD  rizatriptan (MAXALT) 10  MG tablet Take 10 mg by mouth as needed for migraine. May repeat in 2 hours if needed    Historical Provider, MD   Meds Ordered and Administered this Visit  Medications - No data to display  BP 115/76 (BP Location: Left Arm)   Pulse 80   Temp 98.8 F (37.1 C) (Oral)   Resp 16   Ht 5' (1.524 m)   Wt 120 lb (54.4 kg)   LMP 06/12/2016 (Exact Date)   SpO2 100%   BMI 23.44 kg/m  No data found.   Physical Exam  Constitutional: She appears well-developed and well-nourished.  HENT:  Head: Normocephalic.  Right Ear: External ear normal.  Left Ear: External ear normal.  Nose: Nose normal.  Erythema throat  Eyes:  Pupils are equal, round, and reactive to light.  Neck: Normal range of motion.  Cardiovascular: Normal rate.   Pulmonary/Chest: Effort normal.  Abdominal: Soft.  Musculoskeletal: Normal range of motion.  Neurological: She is alert.  Skin: Skin is warm.  Psychiatric: She has a normal mood and affect.  Nursing note and vitals reviewed.   Urgent Care Course   Clinical Course    Procedures (including critical care time)  Labs Review Labs Reviewed  POCT RAPID STREP A (OFFICE)   Strep is negative   Imaging Review No results found.   Visual Acuity Review  Right Eye Distance:   Left Eye Distance:   Bilateral Distance:    Right Eye Near:   Left Eye Near:    Bilateral Near:         MDM   1. Pharyngitis    Tylenol Warm salt water garles Lozenges OOW excuse for last pm.  An After Visit Summary was printed and given to the patient.   [place], PA-C 07/08/16 1538

## 2016-07-08 NOTE — ED Triage Notes (Signed)
Patient reports onset of sore throat and fever 2 days ago; last night high of 101.3. No OTCs today.

## 2016-07-17 DIAGNOSIS — Z113 Encounter for screening for infections with a predominantly sexual mode of transmission: Secondary | ICD-10-CM | POA: Diagnosis not present

## 2016-07-17 DIAGNOSIS — N760 Acute vaginitis: Secondary | ICD-10-CM | POA: Diagnosis not present

## 2016-07-17 MED FILL — FLUCONAZOLE 150 MG TABLET: 150 | 9 days supply | Qty: 3 | Fill #1

## 2016-08-03 DIAGNOSIS — J029 Acute pharyngitis, unspecified: Secondary | ICD-10-CM | POA: Diagnosis not present

## 2016-08-10 DIAGNOSIS — Z01419 Encounter for gynecological examination (general) (routine) without abnormal findings: Secondary | ICD-10-CM | POA: Diagnosis not present

## 2016-08-10 DIAGNOSIS — Z6823 Body mass index (BMI) 23.0-23.9, adult: Secondary | ICD-10-CM | POA: Diagnosis not present

## 2016-08-29 DIAGNOSIS — R5383 Other fatigue: Secondary | ICD-10-CM | POA: Diagnosis not present

## 2016-09-04 DIAGNOSIS — R87610 Atypical squamous cells of undetermined significance on cytologic smear of cervix (ASC-US): Secondary | ICD-10-CM | POA: Diagnosis not present

## 2016-09-04 DIAGNOSIS — N888 Other specified noninflammatory disorders of cervix uteri: Secondary | ICD-10-CM | POA: Diagnosis not present

## 2016-09-04 MED FILL — LARIN 24 FE 1 MG-20 MCG TAB: 1-20 | 28 days supply | Qty: 28 | Fill #0

## 2016-09-28 DIAGNOSIS — R590 Localized enlarged lymph nodes: Secondary | ICD-10-CM | POA: Diagnosis not present

## 2016-09-28 DIAGNOSIS — Z23 Encounter for immunization: Secondary | ICD-10-CM | POA: Diagnosis not present

## 2016-09-28 DIAGNOSIS — N760 Acute vaginitis: Secondary | ICD-10-CM | POA: Diagnosis not present

## 2016-09-28 MED FILL — CLINDAMYCIN 2% VAGINAL CRM: 2 | 7 days supply | Qty: 40 | Fill #0

## 2016-10-02 MED FILL — levoFLOXacin 500 MG TABS: 500 | 7 days supply | Qty: 7 | Fill #0

## 2016-10-12 DIAGNOSIS — R87610 Atypical squamous cells of undetermined significance on cytologic smear of cervix (ASC-US): Secondary | ICD-10-CM | POA: Diagnosis not present

## 2016-10-12 MED FILL — HYDROCODON-APAP 5-325: 5-325 | 1 days supply | Qty: 10 | Fill #0

## 2016-10-13 ENCOUNTER — Inpatient Hospital Stay (HOSPITAL_COMMUNITY)
Admission: AD | Admit: 2016-10-13 | Discharge: 2016-10-13 | Disposition: A | Discharge status: Home / Self Care | Payer: 59 | Source: Ambulatory Visit | Attending: Obstetrics and Gynecology | Admitting: Obstetrics and Gynecology

## 2016-10-13 ENCOUNTER — Encounter (HOSPITAL_COMMUNITY): Payer: Self-pay | Admitting: *Deleted

## 2016-10-13 DIAGNOSIS — Z3202 Encounter for pregnancy test, result negative: Secondary | ICD-10-CM | POA: Diagnosis not present

## 2016-10-13 DIAGNOSIS — K2100 Gastro-esophageal reflux disease with esophagitis, without bleeding: Secondary | ICD-10-CM

## 2016-10-13 DIAGNOSIS — R0789 Other chest pain: Secondary | ICD-10-CM | POA: Insufficient documentation

## 2016-10-13 DIAGNOSIS — R1011 Right upper quadrant pain: Secondary | ICD-10-CM | POA: Insufficient documentation

## 2016-10-13 DIAGNOSIS — Z880 Allergy status to penicillin: Secondary | ICD-10-CM | POA: Insufficient documentation

## 2016-10-13 DIAGNOSIS — R1013 Epigastric pain: Secondary | ICD-10-CM

## 2016-10-13 HISTORY — DX: Other chronic pain: G89.29

## 2016-10-13 HISTORY — DX: Dorsalgia, unspecified: M54.9

## 2016-10-13 LAB — CBC
HCT: 36.6 % (ref 36.0–46.0)
HEMOGLOBIN: 13 g/dL (ref 12.0–15.0)
MCH: 31.2 pg (ref 26.0–34.0)
MCHC: 35.5 g/dL (ref 30.0–36.0)
MCV: 87.8 fL (ref 78.0–100.0)
Platelets: 256 10*3/uL (ref 150–400)
RBC: 4.17 MIL/uL (ref 3.87–5.11)
RDW: 12.5 % (ref 11.5–15.5)
WBC: 6.4 10*3/uL (ref 4.0–10.5)

## 2016-10-13 LAB — COMPREHENSIVE METABOLIC PANEL
ALBUMIN: 4.2 g/dL (ref 3.5–5.0)
ALK PHOS: 58 U/L (ref 38–126)
ALT: 13 U/L — ABNORMAL LOW (ref 14–54)
AST: 17 U/L (ref 15–41)
Anion gap: 5 (ref 5–15)
BILIRUBIN TOTAL: 0.4 mg/dL (ref 0.3–1.2)
BUN: 9 mg/dL (ref 6–20)
CALCIUM: 9.2 mg/dL (ref 8.9–10.3)
CO2: 28 mmol/L (ref 22–32)
Chloride: 105 mmol/L (ref 101–111)
Creatinine, Ser: 0.53 mg/dL (ref 0.44–1.00)
GFR calc Af Amer: >60 mL/min (ref >60)
GFR calc non Af Amer: >60 mL/min (ref >60)
GLUCOSE: 97 mg/dL (ref 65–99)
Potassium: 4 mmol/L (ref 3.5–5.1)
Sodium: 138 mmol/L (ref 135–145)
Total Protein: 7 g/dL (ref 6.5–8.1)

## 2016-10-13 LAB — URINALYSIS, ROUTINE W REFLEX MICROSCOPIC
Bilirubin Urine: NEGATIVE
Glucose, UA: NEGATIVE mg/dL
HGB URINE DIPSTICK: NEGATIVE
Ketones, ur: NEGATIVE mg/dL
Leukocytes, UA: NEGATIVE
Nitrite: NEGATIVE
Protein, ur: NEGATIVE mg/dL
SPECIFIC GRAVITY, URINE: 1.015 (ref 1.005–1.030)
pH: 7 (ref 5.0–8.0)

## 2016-10-13 LAB — D-DIMER, QUANTITATIVE: D-Dimer, Quant: <0.27 ug/mL-FEU (ref 0.00–0.50)

## 2016-10-13 LAB — POCT PREGNANCY, URINE: Preg Test, Ur: NEGATIVE

## 2016-10-13 LAB — LIPASE, BLOOD: Lipase: 35 U/L (ref 11–51)

## 2016-10-13 MED ORDER — SIMETHICONE 80 MG PO CHEW
80.0000 mg | CHEWABLE_TABLET | Freq: Once | ORAL | Status: AC
  Administered 2016-10-13: 80 mg via ORAL
  Filled 2016-10-13: qty 1

## 2016-10-13 MED ORDER — FAMOTIDINE 20 MG PO TABS
20.0000 mg | ORAL_TABLET | Freq: Once | ORAL | Status: AC
  Administered 2016-10-13: 20 mg via ORAL
  Filled 2016-10-13: qty 1

## 2016-10-13 MED ORDER — GI COCKTAIL ~~LOC~~
30.0000 mL | ORAL | Status: AC
  Administered 2016-10-13: 30 mL via ORAL
  Filled 2016-10-13: qty 30

## 2016-10-13 NOTE — MAU Provider Note (Signed)
Chief Complaint: Chest Pain and Abdominal Pain   First Provider Initiated Contact with Patient 10/13/16 1459      SUBJECTIVE HPI: Whitney Gonzalez is a 25 y.o. G1P0010 nonpregnant pt who presents to MAU with sudden onset of upper abdominal pain and chest pain today at school. She had a LEEP this week in the office and is taking Vicodin as needed for pain. She took a dose this morning with breakfast, a piece of chicken and macaroni and cheese from [employer], and went to school.  She felt dizzy and hot about an hour after getting to class and put her head down on the desk and slept a few minutes. When she woke up, she felt a severe pain in her upper abdomen radiating into her upper chest.  She has never had this pain before. Lying down makes the pain better and sitting up makes it worse. Taking a deep breath makes the pain worse.  It is not associated with n/v or fever/chills.  She has hx of acid reflux but this does not feel like her usual heartburn. She reports recent constipation with some white stool noticed this morning.  She denies lower abdominal pain or pelvic pain. She denies vaginal bleeding, vaginal itching/burning, urinary symptoms, h/a, dizziness, n/v, or fever/chills.     HPI  Past Medical History:  Diagnosis Date  . Anxiety   . Asthma   . Bipolar 1 disorder ([place])   . Bipolar 1 disorder ([place])   . Chronic back pain   . GERD (gastroesophageal reflux disease)   . Headache(784.0)    Migraines  . Vaginal Pap smear, abnormal    Past Surgical History:  Procedure Laterality Date  . COLPOSCOPY    . CRYOTHERAPY    . FRENULECTOMY, LINGUAL    . TONSILECTOMY, ADENOIDECTOMY, BILATERAL MYRINGOTOMY AND TUBES     Social History   Social History  . Marital status: Single    Spouse name: N/A  . Number of children: N/A  . Years of education: N/A   Occupational History  . Not on file.   Social History Main Topics  . Smoking status: Never Smoker  . Smokeless tobacco: Never Used   . Alcohol use No     Comment: Last drink October. 2014.  . Drug use: No  . Sexual activity: Yes   Other Topics Concern  . Not on file   Social History Narrative  . No narrative on file   No current facility-administered medications on file prior to encounter.    Current Outpatient Prescriptions on File Prior to Encounter  Medication Sig Dispense Refill  . ibuprofen (ADVIL,MOTRIN) 200 MG tablet Take 800 mg by mouth every 6 (six) hours as needed for mild pain or moderate pain.    . rizatriptan (MAXALT) 10 MG tablet Take 10 mg by mouth as needed for migraine. May repeat in 2 hours if needed     Allergies  Allergen Reactions  . Azithromycin Other (See Comments)    Stevens-Johnsons  . Scallops [Shellfish Allergy] Anaphylaxis  . Allegra [Fexofenadine Hcl] Hives and Nausea And Vomiting  . Other Other (See Comments)    Banana, mango, avacado passion fruit casues mouth tingling and lip itching  . Prozac [Fluoxetine Hcl] Other (See Comments)    Stimulates bi polar disorder  . Sprintec 28 [Norgestimate-Eth Estradiol] Nausea And Vomiting  . Tegretol [Carbamazepine] Other (See Comments)    Kathreen Cosier  . Toradol [Ketorolac Tromethamine] Other (See Comments)    Stimulates Bi-Polar disorder  .  Amoxicillin-Pot Clavulanate Nausea And Vomiting and Rash  . Codeine Palpitations    Heart rate goes over 200bpm  . Latex Rash  . Penicillins Rash    Has patient had a PCN reaction causing immediate rash, facial/tongue/throat swelling, SOB or lightheadedness with hypotension: unknown Has patient had a PCN reaction causing severe rash involving mucus membranes or skin necrosis: unknown Has patient had a PCN reaction that required hospitalization unknown Has patient had a PCN reaction occurring within the last 10 years: unknown If all of the above answers are "NO", then may proceed with Cephalosporin use.      ROS:  Review of Systems  Constitutional: Negative for chills, fatigue and fever.   Respiratory: Negative for shortness of breath.   Cardiovascular: Positive for chest pain.  Gastrointestinal: Positive for abdominal pain and constipation. Negative for nausea and vomiting.  Genitourinary: Negative for difficulty urinating, dysuria, flank pain, pelvic pain, vaginal bleeding, vaginal discharge and vaginal pain.  Neurological: Negative for dizziness and headaches.  Psychiatric/Behavioral: Negative.      I have reviewed patient's Past Medical Hx, Surgical Hx, Family Hx, Social Hx, medications and allergies.   Physical Exam   Patient Vitals for the past 24 hrs:  BP Temp Temp src Pulse Resp  10/13/16 1835 126/83 - - 68 18  10/13/16 1415 123/68 98.4 F (36.9 C) Oral 76 16   Constitutional: Well-developed, well-nourished female in no acute distress.  HEART: normal rate, heart sounds, regular rhythm RESP: normal effort, lung sounds clear and equal bilaterally GI: Abd soft, epigastric tenderness only.  No RLQ tenderness, rebound tenderness or guarding. Pos BS x 4 MS: Extremities nontender, no edema, normal ROM Neurologic: Alert and oriented x 4.  GU: Neg CVAT.  PELVIC EXAM: Deferred  LAB RESULTS Results for orders placed or performed during the hospital encounter of 10/13/16 (from the past 24 hour(s))  Urinalysis, Routine w reflex microscopic (not at [facility])     Status: None   Collection Time: 10/13/16 11:10 AM  Result Value Ref Range   Color, Urine YELLOW YELLOW   APPearance CLEAR CLEAR   Specific Gravity, Urine 1.015 1.005 - 1.030   pH 7.0 5.0 - 8.0   Glucose, UA NEGATIVE NEGATIVE mg/dL   Hgb urine dipstick NEGATIVE NEGATIVE   Bilirubin Urine NEGATIVE NEGATIVE   Ketones, ur NEGATIVE NEGATIVE mg/dL   Protein, ur NEGATIVE NEGATIVE mg/dL   Nitrite NEGATIVE NEGATIVE   Leukocytes, UA NEGATIVE NEGATIVE  Pregnancy, urine POC     Status: None   Collection Time: 10/13/16  2:09 PM  Result Value Ref Range   Preg Test, Ur NEGATIVE NEGATIVE  CBC     Status: None    Collection Time: 10/13/16  3:04 PM  Result Value Ref Range   WBC 6.4 4.0 - 10.5 K/uL   RBC 4.17 3.87 - 5.11 MIL/uL   Hemoglobin 13.0 12.0 - 15.0 g/dL   HCT 36.6 36.0 - 46.0 %   MCV 87.8 78.0 - 100.0 fL   MCH 31.2 26.0 - 34.0 pg   MCHC 35.5 30.0 - 36.0 g/dL   RDW 12.5 11.5 - 15.5 %   Platelets 256 150 - 400 K/uL  Comprehensive metabolic panel     Status: Abnormal   Collection Time: 10/13/16  3:04 PM  Result Value Ref Range   Sodium 138 135 - 145 mmol/L   Potassium 4.0 3.5 - 5.1 mmol/L   Chloride 105 101 - 111 mmol/L   CO2 28 22 - 32 mmol/L  Glucose, Bld 97 65 - 99 mg/dL   BUN 9 6 - 20 mg/dL   Creatinine, Ser 0.53 0.44 - 1.00 mg/dL   Calcium 9.2 8.9 - 10.3 mg/dL   Total Protein 7.0 6.5 - 8.1 g/dL   Albumin 4.2 3.5 - 5.0 g/dL   AST 17 15 - 41 U/L   ALT 13 (L) 14 - 54 U/L   Alkaline Phosphatase 58 38 - 126 U/L   Total Bilirubin 0.4 0.3 - 1.2 mg/dL   GFR calc non Af Amer >60 >60 mL/min   GFR calc Af Amer >60 >60 mL/min   Anion gap 5 5 - 15  Lipase, blood     Status: None   Collection Time: 10/13/16  3:04 PM  Result Value Ref Range   Lipase 35 11 - 51 U/L  D-dimer, quantitative (not at [facility])     Status: None   Collection Time: 10/13/16  3:04 PM  Result Value Ref Range   D-Dimer, Quant <0.27 0.00 - 0.50 ug/mL-FEU   EKG: normal EKG, normal sinus rhythm.     IMAGING No results found.  MAU Management/MDM: Ordered labs and reviewed results.  EKG NSR.  Heart and lung sounds normal.  O2 sat 100% on RA.  No acute findings on today's evaluation.  Pt given Pepcid 20 mg PO, GI Cocktail, Simethicone 80 mg tablet in MAU and reports chest pain fully resolved, and upper abdominal pain improved but not resolved.  Since pain started soon after eating, consider cholelithiasis.  No indications of blockage or acute abdomen today.  Consult Dr Rogue Bussing to review assessment, labs, presentation.  D/C pt with outpatient RUQ ultrasound ordered. Pt to take Zantac or Pepcid PO and Simethicone PO PRN  and follow up with her primary care provider. Pt stable at time of discharge.  ASSESSMENT 1. Abdominal pain, right upper quadrant   2. Abdominal pain, epigastric   3. Other chest pain   4. Gastroesophageal reflux disease with esophagitis     PLAN Discharge home   Medication List    TAKE these medications   albuterol 108 (90 Base) MCG/ACT inhaler Commonly known as:  PROVENTIL HFA;VENTOLIN HFA Inhale 1-2 puffs into the lungs every 6 (six) hours as needed for wheezing or shortness of breath.   clindamycin-benzoyl peroxide gel Commonly known as:  BENZACLIN Apply 1 application topically 2 (two) times daily as needed (ance).   diphenhydrAMINE 25 MG tablet Commonly known as:  BENADRYL Take 25 mg by mouth daily as needed for itching or allergies.   EMERGEN-C IMMUNE PO Take 1 tablet by mouth 2 (two) times daily as needed (cold symptoms).   EPIPEN IJ Inject 1 each as directed once as needed (anaphylaxis emergency).   ibuprofen 200 MG tablet Commonly known as:  ADVIL,MOTRIN Take 800 mg by mouth every 6 (six) hours as needed for mild pain or moderate pain.   LARIN 24 FE 1-20 MG-MCG(24) tablet Generic drug:  Norethindrone Acetate-Ethinyl Estrad-FE Take 1 tablet by mouth daily.   nystatin ointment Commonly known as:  MYCOSTATIN Apply 1 application topically 2 (two) times daily as needed (skin irritation).   PRESCRIPTION MEDICATION Place 1 suppository vaginally at bedtime. After menstrual period   rizatriptan 10 MG tablet Commonly known as:  MAXALT Take 10 mg by mouth as needed for migraine. May repeat in 2 hours if needed      Follow-up Information    London Pepper, MD. Call.   Specialty:  Family Medicine Contact information: [address]  [address] [place] [address] [phone]        Ultrasound. Call.   Why:  Call [phone] for radiology scheduling. Ultrasound will call you with appointment.          Fatima Blank Certified  Nurse-Midwife 10/13/2016  9:04 PM       PERC rule-The PERC rule was designed to identify patients with a low clinical probability of PE in whom the risk of unnecessary testing outweighs the risk of PE [57,66-68] (see 'Low probability of pulmonary embolism' above). The PERC rule has eight criteria (table 3): .Age <50 years .Heart rate <100 beats/minute .Oxyhemoglobin saturation ?95 percent .No hemoptysis .No estrogen use .No prior DVT or PE .No unilateral leg swelling .No surgery/trauma requiring hospitalization within the prior four weeks

## 2016-10-13 NOTE — MAU Note (Signed)
C/o chest pain and upper abdominal pain at noon today; had cyro done yesterday;

## 2016-10-13 NOTE — Discharge Instructions (Signed)
Recommendations for your abdominal pain: Take Pepcid or Zantac twice per day as needed Simethicone (Mylecon or Gas-X) as needed See the diet suggestions below for both gall bladder pain and acid reflux  Low-Fat Diet for Pancreatitis or Gallbladder Conditions A low-fat diet can be helpful if you have pancreatitis or a gallbladder condition. With these conditions, your pancreas and gallbladder have trouble digesting fats. A healthy eating plan with less fat will help rest your pancreas and gallbladder and reduce your symptoms. WHAT DO I NEED TO KNOW ABOUT THIS DIET?  Eat a low-fat diet.  Reduce your fat intake to less than 20-30% of your total daily calories. This is less than 50-60 g of fat per day.  Remember that you need some fat in your diet. Ask your dietician what your daily goal should be.  Choose nonfat and low-fat healthy foods. Look for the words "nonfat," "low fat," or "fat free."  As a guide, look on the label and choose foods with less than 3 g of fat per serving. Eat only one serving.  Avoid alcohol.  Do not smoke. If you need help quitting, talk with your health care provider.  Eat small frequent meals instead of three large heavy meals. WHAT FOODS CAN I EAT? Grains Include healthy grains and starches such as potatoes, wheat bread, fiber-rich cereal, and brown rice. Choose whole grain options whenever possible. In adults, whole grains should account for 45-65% of your daily calories.  Fruits and Vegetables Eat plenty of fruits and vegetables. Fresh fruits and vegetables add fiber to your diet. Meats and Other Protein Sources Eat lean meat such as chicken and pork. Trim any fat off of meat before cooking it. Eggs, fish, and beans are other sources of protein. In adults, these foods should account for 10-35% of your daily calories. Dairy Choose low-fat milk and dairy options. Dairy includes fat and protein, as well as calcium.  Fats and Oils Limit high-fat foods such as  fried foods, sweets, baked goods, sugary drinks.  Other Creamy sauces and condiments, such as mayonnaise, can add extra fat. Think about whether or not you need to use them, or use smaller amounts or low fat options. WHAT FOODS ARE NOT RECOMMENDED?  High fat foods, such as:  [employer].  Ice cream.  [place] toast.  Sweet rolls.  Pizza.  Cheese bread.  Foods covered with batter, butter, creamy sauces, or cheese.  Fried foods.  Sugary drinks and desserts.  Foods that cause gas or bloating   This information is not intended to replace advice given to you by your health care provider. Make sure you discuss any questions you have with your health care provider.   Document Released: 11/25/2013 Document Reviewed: 11/25/2013 Elsevier Interactive Patient Education 2016 [place].   Gastroesophageal Reflux Disease, Adult Normally, food travels down the esophagus and stays in the stomach to be digested. However, when a person has gastroesophageal reflux disease (GERD), food and stomach acid move back up into the esophagus. When this happens, the esophagus becomes sore and inflamed. Over time, GERD can create small holes (ulcers) in the lining of the esophagus.  CAUSES This condition is caused by a problem with the muscle between the esophagus and the stomach (lower esophageal sphincter, or LES). Normally, the LES muscle closes after food passes through the esophagus to the stomach. When the LES is weakened or abnormal, it does not close properly, and that allows food and stomach acid to go back up into the esophagus.  The LES can be weakened by certain dietary substances, medicines, and medical conditions, including:  Tobacco use.  Pregnancy.  Having a hiatal hernia.  Heavy alcohol use.  Certain foods and beverages, such as coffee, chocolate, onions, and peppermint. RISK FACTORS This condition is more likely to develop in:  People who have an increased body weight.  People  who have connective tissue disorders.  People who use NSAID medicines. SYMPTOMS Symptoms of this condition include:  Heartburn.  Difficult or painful swallowing.  The feeling of having a lump in the throat.  Abitter taste in the mouth.  Bad breath.  Having a large amount of saliva.  Having an upset or bloated stomach.  Belching.  Chest pain.  Shortness of breath or wheezing.  Ongoing (chronic) cough or a night-time cough.  Wearing away of tooth enamel.  Weight loss. Different conditions can cause chest pain. Make sure to see your health care provider if you experience chest pain. DIAGNOSIS Your health care provider will take a medical history and perform a physical exam. To determine if you have mild or severe GERD, your health care provider may also monitor how you respond to treatment. You may also have other tests, including:  An endoscopy toexamine your stomach and esophagus with a small camera.  A test thatmeasures the acidity level in your esophagus.  A test thatmeasures how much pressure is on your esophagus.  A barium swallow or modified barium swallow to show the shape, size, and functioning of your esophagus. TREATMENT The goal of treatment is to help relieve your symptoms and to prevent complications. Treatment for this condition may vary depending on how severe your symptoms are. Your health care provider may recommend:  Changes to your diet.  Medicine.  Surgery. HOME CARE INSTRUCTIONS Diet  Follow a diet as recommended by your health care provider. This may involve avoiding foods and drinks such as:  Coffee and tea (with or without caffeine).  Drinks that containalcohol.  Energy drinks and sports drinks.  Carbonated drinks or sodas.  Chocolate and cocoa.  Peppermint and mint flavorings.  Garlic and onions.  Horseradish.  Spicy and acidic foods, including peppers, chili powder, curry powder, vinegar, hot sauces, and barbecue  sauce.  Citrus fruit juices and citrus fruits, such as oranges, lemons, and limes.  Tomato-based foods, such as red sauce, chili, salsa, and pizza with red sauce.  Fried and fatty foods, such as donuts, french fries, potato chips, and high-fat dressings.  High-fat meats, such as hot dogs and fatty cuts of red and white meats, such as rib eye steak, sausage, ham, and bacon.  High-fat dairy items, such as whole milk, butter, and cream cheese.  Eat small, frequent meals instead of large meals.  Avoid drinking large amounts of liquid with your meals.  Avoid eating meals during the 2-3 hours before bedtime.  Avoid lying down right after you eat.  Do not exercise right after you eat. General Instructions  Pay attention to any changes in your symptoms.  Take over-the-counter and prescription medicines only as told by your health care provider. Do not take aspirin, ibuprofen, or other NSAIDs unless your health care provider told you to do so.  Do not use any tobacco products, including cigarettes, chewing tobacco, and e-cigarettes. If you need help quitting, ask your health care provider.  Wear loose-fitting clothing. Do not wear anything tight around your waist that causes pressure on your abdomen.  Raise (elevate) the head of your bed 6 inches (15cm).  Try to reduce your stress, such as with yoga or meditation. If you need help reducing stress, ask your health care provider.  If you are overweight, reduce your weight to an amount that is healthy for you. Ask your health care provider for guidance about a safe weight loss goal.  Keep all follow-up visits as told by your health care provider. This is important. SEEK MEDICAL CARE IF:  You have new symptoms.  You have unexplained weight loss.  You have difficulty swallowing, or it hurts to swallow.  You have wheezing or a persistent cough.  Your symptoms do not improve with treatment.  You have a hoarse voice. SEEK  IMMEDIATE MEDICAL CARE IF:  You have pain in your arms, neck, jaw, teeth, or back.  You feel sweaty, dizzy, or light-headed.  You have chest pain or shortness of breath.  You vomit and your vomit looks like blood or coffee grounds.  You faint.  Your stool is bloody or black.  You cannot swallow, drink, or eat.   This information is not intended to replace advice given to you by your health care provider. Make sure you discuss any questions you have with your health care provider.   Document Released: 08/30/2005 Document Revised: 08/11/2015 Document Reviewed: 03/17/2015 Elsevier Interactive Patient Education [employer].

## 2016-10-18 ENCOUNTER — Ambulatory Visit (HOSPITAL_COMMUNITY)
Admission: RE | Admit: 2016-10-18 | Discharge: 2016-10-18 | Disposition: A | Discharge status: Home / Self Care | Payer: 59 | Source: Ambulatory Visit | Attending: Advanced Practice Midwife | Admitting: Advanced Practice Midwife

## 2016-10-18 DIAGNOSIS — R1011 Right upper quadrant pain: Secondary | ICD-10-CM | POA: Insufficient documentation

## 2016-10-18 DIAGNOSIS — R109 Unspecified abdominal pain: Secondary | ICD-10-CM | POA: Diagnosis not present

## 2016-10-18 DIAGNOSIS — Z113 Encounter for screening for infections with a predominantly sexual mode of transmission: Secondary | ICD-10-CM | POA: Diagnosis not present

## 2016-10-18 DIAGNOSIS — R1013 Epigastric pain: Secondary | ICD-10-CM

## 2016-10-24 ENCOUNTER — Telehealth: Payer: Self-pay | Admitting: General Practice

## 2016-10-24 NOTE — Telephone Encounter (Signed)
Called patient regarding ultrasound results. No answer- unable to leave message due to voicemail full. Will send letter

## 2016-11-03 DIAGNOSIS — N6001 Solitary cyst of right breast: Secondary | ICD-10-CM | POA: Diagnosis not present

## 2016-11-03 DIAGNOSIS — N3000 Acute cystitis without hematuria: Secondary | ICD-10-CM | POA: Diagnosis not present

## 2016-11-03 MED FILL — NITROFURANTOIN MCR 100 MG C: 100 | 7 days supply | Qty: 14 | Fill #0

## 2016-11-08 ENCOUNTER — Other Ambulatory Visit: Payer: Self-pay | Admitting: Family Medicine

## 2016-11-08 DIAGNOSIS — N6001 Solitary cyst of right breast: Secondary | ICD-10-CM

## 2016-11-09 MED FILL — FLUCONAZOLE 150 MG TABLET: 150 | 5 days supply | Qty: 2 | Fill #0

## 2016-11-10 ENCOUNTER — Other Ambulatory Visit: Payer: Self-pay | Admitting: Family Medicine

## 2016-11-10 DIAGNOSIS — N6019 Diffuse cystic mastopathy of unspecified breast: Secondary | ICD-10-CM

## 2016-11-24 ENCOUNTER — Emergency Department
Admission: EM | Admit: 2016-11-24 | Discharge: 2016-11-24 | Disposition: A | Discharge status: Home / Self Care | Payer: 59 | Source: Home / Self Care | Attending: Family Medicine | Admitting: Family Medicine

## 2016-11-24 ENCOUNTER — Encounter: Payer: Self-pay | Admitting: Emergency Medicine

## 2016-11-24 DIAGNOSIS — N3000 Acute cystitis without hematuria: Secondary | ICD-10-CM | POA: Diagnosis not present

## 2016-11-24 LAB — POCT URINALYSIS DIP (MANUAL ENTRY)
Bilirubin, UA: NEGATIVE
GLUCOSE UA: NEGATIVE
Ketones, POC UA: NEGATIVE
NITRITE UA: NEGATIVE
Protein Ur, POC: NEGATIVE
Spec Grav, UA: 1.025 (ref 1.005–1.03)
UROBILINOGEN UA: NEGATIVE (ref 0–1)
pH, UA: 6.5 (ref 5–8)

## 2016-11-24 LAB — POCT URINE PREGNANCY: PREG TEST UR: NEGATIVE

## 2016-11-24 MED ORDER — NITROFURANTOIN MONOHYD MACRO 100 MG PO CAPS: 100.0000 mg | ORAL_CAPSULE | Freq: Two times a day (BID) | ORAL | 0 refills | Status: DC

## 2016-11-24 NOTE — Discharge Instructions (Signed)
Increase fluid intake. °If symptoms become significantly worse during the night or over the weekend, proceed to the local emergency room.  °

## 2016-11-24 NOTE — ED Triage Notes (Signed)
Pt c/o urinary urgency, frequency and burning that started today. Also c/o brown d/c. No odor. Denies concern for STD.

## 2016-11-24 NOTE — ED Provider Notes (Signed)
[facility]    CSN: [id number] Arrival date & time: 11/24/16  1520     History   Chief Complaint Chief Complaint  Patient presents with  . Urinary Tract Infection    HPI Whitney Gonzalez is a 25 y.o. female.   Yesterday patient felt fatigued and developed chills and low back ache.  Today she noted dysuria and urgency.  No pelvic or abdominal pain.  No concern for STD.   The history is provided by the patient.  Urinary Tract Infection  Pain quality:  Burning Pain severity:  Mild Onset quality:  Sudden Duration:  1 day Timing:  Constant Progression:  Worsening Chronicity:  New Recent urinary tract infections: no   Relieved by:  None tried Ineffective treatments:  None tried Urinary symptoms: frequent urination and hesitancy   Urinary symptoms: no discolored urine, no foul-smelling urine, no hematuria and no bladder incontinence   Associated symptoms: no abdominal pain, no fever, no flank pain, no genital lesions, no nausea and no vomiting     Past Medical History:  Diagnosis Date  . Anxiety   . Asthma   . Bipolar 1 disorder ([place])   . Bipolar 1 disorder ([place])   . Chronic back pain   . GERD (gastroesophageal reflux disease)   . Headache(784.0)    Migraines  . Vaginal Pap smear, abnormal     Patient Active Problem List   Diagnosis Date Noted  . Bipolar I disorder ([place]) 05/28/2012    Past Surgical History:  Procedure Laterality Date  . COLPOSCOPY    . CRYOTHERAPY    . FRENULECTOMY, LINGUAL    . TONSILECTOMY, ADENOIDECTOMY, BILATERAL MYRINGOTOMY AND TUBES      OB History    Gravida Para Term Preterm AB Living   1       1     SAB TAB Ectopic Multiple Live Births   1               Home Medications    Prior to Admission medications   Medication Sig Start Date End Date Taking? Authorizing Provider  albuterol (PROVENTIL HFA;VENTOLIN HFA) 108 (90 Base) MCG/ACT inhaler Inhale 1-2 puffs into the lungs every 6 (six) hours as needed for  wheezing or shortness of breath.    Historical Provider, MD  clindamycin-benzoyl peroxide (BENZACLIN) gel Apply 1 application topically 2 (two) times daily as needed (ance).    Historical Provider, MD  diphenhydrAMINE (BENADRYL) 25 MG tablet Take 25 mg by mouth daily as needed for itching or allergies.    Historical Provider, MD  EPINEPHrine (EPIPEN IJ) Inject 1 each as directed once as needed (anaphylaxis emergency).    Historical Provider, MD  ibuprofen (ADVIL,MOTRIN) 200 MG tablet Take 800 mg by mouth every 6 (six) hours as needed for mild pain or moderate pain.    Historical Provider, MD  Multiple Vitamins-Minerals (EMERGEN-C IMMUNE PO) Take 1 tablet by mouth 2 (two) times daily as needed (cold symptoms).    Historical Provider, MD  nitrofurantoin, macrocrystal-monohydrate, (MACROBID) 100 MG capsule Take 1 capsule (100 mg total) by mouth 2 (two) times daily. Take with food. 11/24/16   Kandra Nicolas, MD  Norethindrone Acetate-Ethinyl Estrad-FE (LARIN 24 FE) 1-20 MG-MCG(24) tablet Take 1 tablet by mouth daily.    Historical Provider, MD  nystatin ointment (MYCOSTATIN) Apply 1 application topically 2 (two) times daily as needed (skin irritation).    Historical Provider, MD  PRESCRIPTION MEDICATION Place 1 suppository vaginally at bedtime. After menstrual  period    Historical Provider, MD  rizatriptan (MAXALT) 10 MG tablet Take 10 mg by mouth as needed for migraine. May repeat in 2 hours if needed    Historical Provider, MD    Family History Family History  Problem Relation Age of Onset  . Bipolar disorder Father   . ADD / ADHD Father   . Anxiety disorder Father   . Diabetes Mellitus II Maternal Grandfather   . CAD Maternal Grandfather   . Depression Maternal Grandmother   . Diabetes Mellitus II Maternal Grandmother   . Hypertension Maternal Grandmother   . Hyperlipidemia Maternal Grandmother   . CAD Paternal Grandfather   . Alcohol abuse Paternal Grandmother   . Depression Paternal  Grandmother   . CAD Paternal Grandmother     Social History Social History  Substance Use Topics  . Smoking status: Never Smoker  . Smokeless tobacco: Never Used  . Alcohol use No     Comment: Last drink October. 2014.     Allergies   Azithromycin; Scallops [shellfish allergy]; Allegra [fexofenadine hcl]; Other; Prozac [fluoxetine hcl]; Sprintec 28 [norgestimate-eth estradiol]; Tegretol [carbamazepine]; Toradol [ketorolac tromethamine]; Amoxicillin-pot clavulanate; Codeine; Latex; and Penicillins   Review of Systems Review of Systems  Constitutional: Negative for fever.  Gastrointestinal: Negative for abdominal pain, nausea and vomiting.  Genitourinary: Negative for flank pain.  All other systems reviewed and are negative.    Physical Exam Triage Vital Signs ED Triage Vitals  Enc Vitals Group     BP 11/24/16 1611 124/85     Pulse Rate 11/24/16 1611 80     Resp --      Temp 11/24/16 1611 98.2 F (36.8 C)     Temp Source 11/24/16 1611 Oral     SpO2 11/24/16 1611 100 %     Weight 11/24/16 1612 123 lb (55.8 kg)     Height --      Head Circumference --      Peak Flow --      Pain Score 11/24/16 1614 0     Pain Loc --      Pain Edu? --      Excl. in [place]? --    No data found.   Updated Vital Signs BP 124/85 (BP Location: Right Arm)   Pulse 80   Temp 98.2 F (36.8 C) (Oral)   Wt 123 lb (55.8 kg)   SpO2 100%   BMI 24.02 kg/m   Visual Acuity Right Eye Distance:   Left Eye Distance:   Bilateral Distance:    Right Eye Near:   Left Eye Near:    Bilateral Near:     Physical Exam Nursing notes and Vital Signs reviewed. Appearance:  Patient appears stated age, and in no acute distress.    Eyes:  Pupils are equal, round, and reactive to light and accomodation.  Extraocular movement is intact.  Conjunctivae are not inflamed   Pharynx:  Normal; moist mucous membranes  Neck:  Supple.  No adenopathy Lungs:  Clear to auscultation.  Breath sounds are equal.  Moving  air well. Heart:  Regular rate and rhythm without murmurs, rubs, or gallops.  Abdomen:  Nontender without masses or hepatosplenomegaly.  Bowel sounds are present.  No CVA or flank tenderness.  Extremities:  No edema.  Skin:  No rash present.     UC Treatments / Results  Labs (all labs ordered are listed, but only abnormal results are displayed) Labs Reviewed  POCT URINALYSIS DIP (MANUAL ENTRY) -  Abnormal; Notable for the following:       Result Value   Clarity, UA cloudy (*)    Blood, UA moderate (*)    Leukocytes, UA large (3+) (*)    All other components within normal limits  URINE CULTURE  POCT URINE PREGNANCY negative    EKG  EKG Interpretation None       Radiology No results found.  Procedures Procedures (including critical care time)  Medications Ordered in UC Medications - No data to display   Initial Impression / Assessment and Plan / UC Course  I have reviewed the triage vital signs and the nursing notes.  Pertinent labs & imaging results that were available during my care of the patient were reviewed by me and considered in my medical decision making (see chart for details).  Clinical Course   Urine culture pending. Begin Macrobid 100mg  BID for one week. Increase fluid intake. If symptoms become significantly worse during the night or over the weekend, proceed to the local emergency room.  Followup with Family Doctor if not improved in one week.      Final Clinical Impressions(s) / UC Diagnoses   Final diagnoses:  Acute cystitis without hematuria    New Prescriptions New Prescriptions   NITROFURANTOIN, MACROCRYSTAL-MONOHYDRATE, (MACROBID) 100 MG CAPSULE    Take 1 capsule (100 mg total) by mouth 2 (two) times daily. Take with food.     Kandra Nicolas, MD 12/08/16 [phone]

## 2016-11-26 LAB — URINE CULTURE: Organism ID, Bacteria: NO GROWTH

## 2016-11-28 ENCOUNTER — Telehealth: Payer: Self-pay | Admitting: *Deleted

## 2016-11-28 MED ORDER — FLUCONAZOLE 200 MG PO TABS: 200.0000 mg | ORAL_TABLET | Freq: Once | ORAL | 1 refills | Status: DC

## 2016-11-28 NOTE — Telephone Encounter (Signed)
Patient returned call. She is showing improvement. UCX discussed. She asked for diflucan to be sent to [facility]. Rx e-scribed.

## 2016-11-28 NOTE — Telephone Encounter (Signed)
Callback: No answer on cell or home phone. VM box full.

## 2016-12-06 DIAGNOSIS — R3915 Urgency of urination: Secondary | ICD-10-CM | POA: Diagnosis not present

## 2016-12-06 DIAGNOSIS — N390 Urinary tract infection, site not specified: Secondary | ICD-10-CM | POA: Diagnosis not present

## 2016-12-08 ENCOUNTER — Encounter (HOSPITAL_COMMUNITY): Payer: Self-pay | Admitting: *Deleted

## 2016-12-08 DIAGNOSIS — Z91040 Latex allergy status: Secondary | ICD-10-CM | POA: Diagnosis not present

## 2016-12-08 DIAGNOSIS — R3989 Other symptoms and signs involving the genitourinary system: Secondary | ICD-10-CM | POA: Diagnosis not present

## 2016-12-08 DIAGNOSIS — J45909 Unspecified asthma, uncomplicated: Secondary | ICD-10-CM | POA: Insufficient documentation

## 2016-12-08 LAB — URINALYSIS, ROUTINE W REFLEX MICROSCOPIC
Bilirubin Urine: NEGATIVE
Glucose, UA: NEGATIVE mg/dL
Hgb urine dipstick: NEGATIVE
KETONES UR: NEGATIVE mg/dL
Leukocytes, UA: NEGATIVE
NITRITE: NEGATIVE
Protein, ur: NEGATIVE mg/dL
Specific Gravity, Urine: 1.019 (ref 1.005–1.030)
pH: 5 (ref 5.0–8.0)

## 2016-12-08 LAB — CBC
HCT: 36.9 % (ref 36.0–46.0)
Hemoglobin: 13 g/dL (ref 12.0–15.0)
MCH: 31.1 pg (ref 26.0–34.0)
MCHC: 35.2 g/dL (ref 30.0–36.0)
MCV: 88.3 fL (ref 78.0–100.0)
PLATELETS: 236 10*3/uL (ref 150–400)
RBC: 4.18 MIL/uL (ref 3.87–5.11)
RDW: 12.3 % (ref 11.5–15.5)
WBC: 8.5 10*3/uL (ref 4.0–10.5)

## 2016-12-08 LAB — COMPREHENSIVE METABOLIC PANEL
ALK PHOS: 55 U/L (ref 38–126)
ALT: 31 U/L (ref 14–54)
ANION GAP: 9 (ref 5–15)
AST: 23 U/L (ref 15–41)
Albumin: 4.3 g/dL (ref 3.5–5.0)
BILIRUBIN TOTAL: 0.2 mg/dL — AB (ref 0.3–1.2)
BUN: 8 mg/dL (ref 6–20)
CALCIUM: 9.7 mg/dL (ref 8.9–10.3)
CO2: 25 mmol/L (ref 22–32)
CREATININE: 0.6 mg/dL (ref 0.44–1.00)
Chloride: 107 mmol/L (ref 101–111)
GFR calc Af Amer: >60 mL/min (ref >60)
GFR calc non Af Amer: >60 mL/min (ref >60)
GLUCOSE: 71 mg/dL (ref 65–99)
Potassium: 4.2 mmol/L (ref 3.5–5.1)
Sodium: 141 mmol/L (ref 135–145)
Total Protein: 6.7 g/dL (ref 6.5–8.1)

## 2016-12-08 LAB — POC URINE PREG, ED: PREG TEST UR: NEGATIVE

## 2016-12-08 NOTE — ED Triage Notes (Signed)
The pt is c/o  Bi-lateral flank pain  She has had uti treatment for one month  Nausea intermittently  She has also been on numerus antibiotics  She is currently on suprax  She has taken 5 pills of the suprax.  No temp  lmpdec 21st

## 2016-12-09 ENCOUNTER — Emergency Department (HOSPITAL_COMMUNITY)
Admission: EM | Admit: 2016-12-09 | Discharge: 2016-12-09 | Disposition: A | Discharge status: Home / Self Care | Payer: 59 | Attending: Emergency Medicine | Admitting: Emergency Medicine

## 2016-12-09 DIAGNOSIS — R3989 Other symptoms and signs involving the genitourinary system: Secondary | ICD-10-CM | POA: Diagnosis not present

## 2016-12-09 DIAGNOSIS — J45909 Unspecified asthma, uncomplicated: Secondary | ICD-10-CM | POA: Diagnosis not present

## 2016-12-09 DIAGNOSIS — Z91040 Latex allergy status: Secondary | ICD-10-CM | POA: Diagnosis not present

## 2016-12-09 MED ORDER — PHENAZOPYRIDINE HCL 100 MG PO TABS
200.0000 mg | ORAL_TABLET | Freq: Once | ORAL | Status: AC
  Administered 2016-12-09: 200 mg via ORAL
  Filled 2016-12-09: qty 2

## 2016-12-09 MED ORDER — PHENAZOPYRIDINE HCL 200 MG PO TABS
200.0000 mg | ORAL_TABLET | Freq: Three times a day (TID) | ORAL | 0 refills | Status: DC
Start: 2016-12-09 — End: 2017-05-23

## 2016-12-09 NOTE — ED Notes (Signed)
Pt unable to sign due to registration still in the chart. Pt verbalized understanding of discharge instructions and has no further questions.

## 2016-12-09 NOTE — Discharge Instructions (Signed)
Have been given a prescription for medication called Pyridium.  This is bladder, anesthetic and will call the inside of your bladder.  Please take this as directed as a precaution your urine will turn bright orange.  You have been given a referral to his urology.  Please call and make an appointment with the next available physician for further evaluation.  I think you have a condition called interstitial cystitis.  You were given a handout information. Sometimes, you can take antihistamine and this will decrease the urgency and frequency.

## 2016-12-09 NOTE — ED Provider Notes (Addendum)
[facility] DEPT Provider Note   CSN: [id number] Arrival date & time: 12/08/16  1847     History   Chief Complaint Chief Complaint  Patient presents with  . Urinary Tract Infection    HPI Whitney Gonzalez is a 26 y.o. female.  The 26 year old female who reports chronic UTIs despite the lack of true bacterial infections.  She is currently taking Cipro for the past 5 days for a urine dip at the primary care physician.  She is still having persistent discomfort.  It includes urgency, frequency and cramping.  She denies any vaginal discharge or concern for an STD is normal menstrual cycle.  She is currently on birth control pills.  Denies any nausea or vomiting, diarrhea or constipation. She states her discomfort is worse at night and she is finding it difficult to sleep.  She does not take any over-the-counter medication for discomfort.       Past Medical History:  Diagnosis Date  . Anxiety   . Asthma   . Bipolar 1 disorder ([place])   . Bipolar 1 disorder ([place])   . Chronic back pain   . GERD (gastroesophageal reflux disease)   . Headache(784.0)    Migraines  . Vaginal Pap smear, abnormal     Patient Active Problem List   Diagnosis Date Noted  . Bipolar I disorder ([place]) 05/28/2012    Past Surgical History:  Procedure Laterality Date  . COLPOSCOPY    . CRYOTHERAPY    . FRENULECTOMY, LINGUAL    . TONSILECTOMY, ADENOIDECTOMY, BILATERAL MYRINGOTOMY AND TUBES      OB History    Gravida Para Term Preterm AB Living   1       1     SAB TAB Ectopic Multiple Live Births   1               Home Medications    Prior to Admission medications   Medication Sig Start Date End Date Taking? Authorizing Provider  albuterol (PROVENTIL HFA;VENTOLIN HFA) 108 (90 Base) MCG/ACT inhaler Inhale 1-2 puffs into the lungs every 6 (six) hours as needed for wheezing or shortness of breath.    Historical Provider, MD  clindamycin-benzoyl peroxide (BENZACLIN) gel Apply 1 application  topically 2 (two) times daily as needed (ance).    Historical Provider, MD  diphenhydrAMINE (BENADRYL) 25 MG tablet Take 25 mg by mouth daily as needed for itching or allergies.    Historical Provider, MD  EPINEPHrine (EPIPEN IJ) Inject 1 each as directed once as needed (anaphylaxis emergency).    Historical Provider, MD  ibuprofen (ADVIL,MOTRIN) 200 MG tablet Take 800 mg by mouth every 6 (six) hours as needed for mild pain or moderate pain.    Historical Provider, MD  Multiple Vitamins-Minerals (EMERGEN-C IMMUNE PO) Take 1 tablet by mouth 2 (two) times daily as needed (cold symptoms).    Historical Provider, MD  nitrofurantoin, macrocrystal-monohydrate, (MACROBID) 100 MG capsule Take 1 capsule (100 mg total) by mouth 2 (two) times daily. Take with food. 11/24/16   Kandra Nicolas, MD  Norethindrone Acetate-Ethinyl Estrad-FE (LARIN 24 FE) 1-20 MG-MCG(24) tablet Take 1 tablet by mouth daily.    Historical Provider, MD  nystatin ointment (MYCOSTATIN) Apply 1 application topically 2 (two) times daily as needed (skin irritation).    Historical Provider, MD  phenazopyridine (PYRIDIUM) 200 MG tablet Take 1 tablet (200 mg total) by mouth 3 (three) times daily. 12/09/16   Junius Creamer, NP  PRESCRIPTION MEDICATION Place 1 suppository vaginally  at bedtime. After menstrual period    Historical Provider, MD  rizatriptan (MAXALT) 10 MG tablet Take 10 mg by mouth as needed for migraine. May repeat in 2 hours if needed    Historical Provider, MD    Family History Family History  Problem Relation Age of Onset  . Bipolar disorder Father   . ADD / ADHD Father   . Anxiety disorder Father   . Diabetes Mellitus II Maternal Grandfather   . CAD Maternal Grandfather   . Depression Maternal Grandmother   . Diabetes Mellitus II Maternal Grandmother   . Hypertension Maternal Grandmother   . Hyperlipidemia Maternal Grandmother   . CAD Paternal Grandfather   . Alcohol abuse Paternal Grandmother   . Depression Paternal  Grandmother   . CAD Paternal Grandmother     Social History Social History  Substance Use Topics  . Smoking status: Never Smoker  . Smokeless tobacco: Never Used  . Alcohol use No     Comment: Last drink October. 2014.     Allergies   Azithromycin; Scallops [shellfish allergy]; Allegra [fexofenadine hcl]; Other; Prozac [fluoxetine hcl]; Sprintec 28 [norgestimate-eth estradiol]; Tegretol [carbamazepine]; Toradol [ketorolac tromethamine]; Amoxicillin-pot clavulanate; Codeine; Latex; and Penicillins   Review of Systems Review of Systems  Constitutional: Negative for chills and fever.  Gastrointestinal: Negative for constipation, diarrhea, nausea and vomiting.  Genitourinary: Positive for dyspareunia, dysuria and urgency. Negative for menstrual problem, vaginal bleeding, vaginal discharge and vaginal pain.  All other systems reviewed and are negative.    Physical Exam Updated Vital Signs BP 122/76 (BP Location: Right Arm)   Pulse 77   Temp 98.6 F (37 C) (Oral)   Resp 18   Ht 5' (1.524 m)   Wt 57.7 kg   LMP 11/23/2016   SpO2 100%   BMI 24.85 kg/m   Physical Exam  Constitutional: She appears well-developed and well-nourished.  HENT:  Head: Normocephalic.  Eyes: Pupils are equal, round, and reactive to light.  Neck: Normal range of motion.  Cardiovascular: Normal rate.   Pulmonary/Chest: Effort normal.  Abdominal: Soft. She exhibits no distension. There is generalized tenderness.  Musculoskeletal: Normal range of motion.  Neurological: She is alert.  Skin: Skin is warm and dry. No rash noted.  Psychiatric: She has a normal mood and affect.  Nursing note and vitals reviewed.    ED Treatments / Results  Labs (all labs ordered are listed, but only abnormal results are displayed) Labs Reviewed  COMPREHENSIVE METABOLIC PANEL - Abnormal; Notable for the following:       Result Value   Total Bilirubin 0.2 (*)    All other components within normal limits   URINALYSIS, ROUTINE W REFLEX MICROSCOPIC - Abnormal; Notable for the following:    APPearance HAZY (*)    All other components within normal limits  CBC  POC URINE PREG, ED    EKG  EKG Interpretation None       Radiology No results found.  Procedures Procedures (including critical care time)  Medications Ordered in ED Medications  phenazopyridine (PYRIDIUM) tablet 200 mg (200 mg Oral Given 12/09/16 0158)     Initial Impression / Assessment and Plan / ED Course  I have reviewed the triage vital signs and the nursing notes.  Pertinent labs & imaging results that were available during my care of the patient were reviewed by me and considered in my medical decision making (see chart for details).  Clinical Course      Review patient's labs today  reveals no urinary tract infection.  On talking with her, her symptoms are very consistent with interstitial cystitis.  She'll be started on Pyridium and an antihistamine and referred to urology  Final Clinical Impressions(s) / ED Diagnoses   Final diagnoses:  Bladder pain    New Prescriptions Discharge Medication List as of 12/09/2016  1:53 AM    START taking these medications   Details  phenazopyridine (PYRIDIUM) 200 MG tablet Take 1 tablet (200 mg total) by mouth 3 (three) times daily., Starting Sat 12/09/2016, Print         Junius Creamer, NP 12/09/16 [place], MD 12/09/16 0155    Junius Creamer, NP 12/09/16 [place], MD 12/09/16 [id number]

## 2016-12-11 ENCOUNTER — Ambulatory Visit
Admission: RE | Admit: 2016-12-11 | Discharge: 2016-12-11 | Disposition: A | Discharge status: Home / Self Care | Payer: 59 | Source: Ambulatory Visit | Attending: Family Medicine | Admitting: Family Medicine

## 2016-12-11 DIAGNOSIS — N6320 Unspecified lump in the left breast, unspecified quadrant: Secondary | ICD-10-CM | POA: Diagnosis not present

## 2016-12-11 DIAGNOSIS — N6310 Unspecified lump in the right breast, unspecified quadrant: Secondary | ICD-10-CM | POA: Diagnosis not present

## 2016-12-11 DIAGNOSIS — N6019 Diffuse cystic mastopathy of unspecified breast: Secondary | ICD-10-CM

## 2017-01-01 DIAGNOSIS — R1084 Generalized abdominal pain: Secondary | ICD-10-CM | POA: Diagnosis not present

## 2017-01-01 DIAGNOSIS — R8271 Bacteriuria: Secondary | ICD-10-CM | POA: Diagnosis not present

## 2017-01-01 DIAGNOSIS — R300 Dysuria: Secondary | ICD-10-CM | POA: Diagnosis not present

## 2017-01-03 MED FILL — DOXYCYCLINE HYCLATE 100 MG: 100 | 7 days supply | Qty: 14 | Fill #0

## 2017-01-10 MED FILL — FLUCONAZOLE 200 MG TABLET: 200 | 3 days supply | Qty: 3 | Fill #0

## 2017-02-01 DIAGNOSIS — R300 Dysuria: Secondary | ICD-10-CM | POA: Diagnosis not present

## 2017-02-01 DIAGNOSIS — R1084 Generalized abdominal pain: Secondary | ICD-10-CM | POA: Diagnosis not present

## 2017-02-01 MED FILL — URO-MP CAPSULE: 118 | 8 days supply | Qty: 30 | Fill #0

## 2017-02-12 DIAGNOSIS — F411 Generalized anxiety disorder: Secondary | ICD-10-CM | POA: Diagnosis not present

## 2017-02-12 DIAGNOSIS — R12 Heartburn: Secondary | ICD-10-CM | POA: Diagnosis not present

## 2017-02-12 DIAGNOSIS — J45909 Unspecified asthma, uncomplicated: Secondary | ICD-10-CM | POA: Diagnosis not present

## 2017-02-12 DIAGNOSIS — R0689 Other abnormalities of breathing: Secondary | ICD-10-CM | POA: Diagnosis not present

## 2017-02-22 DIAGNOSIS — R140 Abdominal distension (gaseous): Secondary | ICD-10-CM | POA: Diagnosis not present

## 2017-02-22 DIAGNOSIS — Z3200 Encounter for pregnancy test, result unknown: Secondary | ICD-10-CM | POA: Diagnosis not present

## 2017-02-22 DIAGNOSIS — F411 Generalized anxiety disorder: Secondary | ICD-10-CM | POA: Diagnosis not present

## 2017-03-09 ENCOUNTER — Institutional Professional Consult (permissible substitution): Payer: Self-pay | Admitting: Pulmonary Disease

## 2017-03-12 DIAGNOSIS — Z3202 Encounter for pregnancy test, result negative: Secondary | ICD-10-CM | POA: Diagnosis not present

## 2017-03-12 DIAGNOSIS — N926 Irregular menstruation, unspecified: Secondary | ICD-10-CM | POA: Diagnosis not present

## 2017-03-12 DIAGNOSIS — R635 Abnormal weight gain: Secondary | ICD-10-CM | POA: Diagnosis not present

## 2017-03-12 DIAGNOSIS — Z6826 Body mass index (BMI) 26.0-26.9, adult: Secondary | ICD-10-CM | POA: Diagnosis not present

## 2017-03-12 MED FILL — PROGESTERONE 100 MG CAPSULE: 100 | 28 days supply | Qty: 14 | Fill #0

## 2017-04-02 ENCOUNTER — Encounter (HOSPITAL_COMMUNITY): Payer: Self-pay | Admitting: Psychiatry

## 2017-04-02 ENCOUNTER — Ambulatory Visit (INDEPENDENT_AMBULATORY_CARE_PROVIDER_SITE_OTHER): Payer: 59 | Admitting: Psychiatry

## 2017-04-02 VITALS — BP 124/72 | HR 97 | Resp 16 | Ht 60.0 in | Wt 132.0 lb

## 2017-04-02 DIAGNOSIS — F319 Bipolar disorder, unspecified: Secondary | ICD-10-CM | POA: Diagnosis not present

## 2017-04-02 DIAGNOSIS — Z811 Family history of alcohol abuse and dependence: Secondary | ICD-10-CM

## 2017-04-02 DIAGNOSIS — Z79899 Other long term (current) drug therapy: Secondary | ICD-10-CM

## 2017-04-02 DIAGNOSIS — F411 Generalized anxiety disorder: Secondary | ICD-10-CM | POA: Diagnosis not present

## 2017-04-02 DIAGNOSIS — Z818 Family history of other mental and behavioral disorders: Secondary | ICD-10-CM | POA: Diagnosis not present

## 2017-04-02 MED ORDER — ESCITALOPRAM OXALATE 5 MG PO TABS: 5.0000 mg | ORAL_TABLET | Freq: Every day | ORAL | 0 refills | Status: DC

## 2017-04-02 MED ORDER — LAMOTRIGINE 25 MG PO TABS: 25.0000 mg | ORAL_TABLET | Freq: Every day | ORAL | 0 refills | Status: DC

## 2017-04-02 MED FILL — ESCITALOPRAM 5 MG TABLET: 5 | 30 days supply | Qty: 30 | Fill #0

## 2017-04-02 MED FILL — lamoTRIgine 25 MG TABS: 25 | 30 days supply | Qty: 60 | Fill #0
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
## 2017-04-02 NOTE — Patient Instructions (Addendum)
START LAMICTAL 25MG  ONE A DAY. INCREASE IT TO 2 A DAY IN ONE WEEK START LEXAPRO 5MG  IN 4 -5 DAYS.

## 2017-04-03 MED FILL — PROGESTERONE 100 MG CAPSULE: 100 | 28 days supply | Qty: 14 | Fill #1

## 2017-04-06 ENCOUNTER — Telehealth (HOSPITAL_COMMUNITY): Payer: Self-pay | Admitting: *Deleted

## 2017-04-06 NOTE — Telephone Encounter (Signed)
Pt called stating Lamictal prescription is causing headaches. Pt would like another medication. Please advise.

## 2017-04-06 NOTE — Telephone Encounter (Signed)
Has been on higher dose of lamcital before. Not sure if it may be causing the headache.  lexapro may cause headache. Start taking half of the current dose she is taking Also not to increase lamictal dose for now Separate the timing of the two meds 6 hours apart  Call back for concerns

## 2017-04-09 NOTE — Telephone Encounter (Signed)
Return telephone call to pt. Unable to lvm, mailbox is full.

## 2017-04-11 DIAGNOSIS — R635 Abnormal weight gain: Secondary | ICD-10-CM | POA: Diagnosis not present

## 2017-04-11 DIAGNOSIS — Z3200 Encounter for pregnancy test, result unknown: Secondary | ICD-10-CM | POA: Diagnosis not present

## 2017-04-16 ENCOUNTER — Institutional Professional Consult (permissible substitution): Payer: Self-pay | Admitting: Internal Medicine

## 2017-04-16 DIAGNOSIS — R87612 Low grade squamous intraepithelial lesion on cytologic smear of cervix (LGSIL): Secondary | ICD-10-CM | POA: Diagnosis not present

## 2017-04-16 DIAGNOSIS — L639 Alopecia areata, unspecified: Secondary | ICD-10-CM | POA: Diagnosis not present

## 2017-04-16 DIAGNOSIS — R87619 Unspecified abnormal cytological findings in specimens from cervix uteri: Secondary | ICD-10-CM | POA: Diagnosis not present

## 2017-04-16 DIAGNOSIS — L659 Nonscarring hair loss, unspecified: Secondary | ICD-10-CM | POA: Diagnosis not present

## 2017-04-16 DIAGNOSIS — R87610 Atypical squamous cells of undetermined significance on cytologic smear of cervix (ASC-US): Secondary | ICD-10-CM | POA: Diagnosis not present

## 2017-04-19 NOTE — Telephone Encounter (Signed)
Spoke to patient. Patient will discuss this when she comes in for her appt on 6/4

## 2017-04-20 MED FILL — PROGESTERONE 100 MG CAPSULE: 100 | 30 days supply | Qty: 30 | Fill #0

## 2017-04-23 ENCOUNTER — Ambulatory Visit (HOSPITAL_COMMUNITY): Payer: Self-pay | Admitting: Psychiatry

## 2017-05-07 ENCOUNTER — Ambulatory Visit (HOSPITAL_COMMUNITY): Payer: Self-pay | Admitting: Psychiatry

## 2017-05-11 DIAGNOSIS — H938X9 Other specified disorders of ear, unspecified ear: Secondary | ICD-10-CM | POA: Diagnosis not present

## 2017-05-11 DIAGNOSIS — H9202 Otalgia, left ear: Secondary | ICD-10-CM | POA: Diagnosis not present

## 2017-05-11 MED FILL — NEO/POLYMYXIN/HC EAR SOLN: 3.5-10000-1 | 17 days supply | Qty: 10 | Fill #0

## 2017-05-14 ENCOUNTER — Encounter (HOSPITAL_COMMUNITY): Payer: Self-pay | Admitting: Psychiatry

## 2017-05-14 ENCOUNTER — Ambulatory Visit (INDEPENDENT_AMBULATORY_CARE_PROVIDER_SITE_OTHER): Payer: 59 | Admitting: Psychiatry

## 2017-05-14 VITALS — BP 126/72 | HR 96 | Resp 16 | Ht 60.0 in | Wt 131.0 lb

## 2017-05-14 DIAGNOSIS — Z79890 Hormone replacement therapy (postmenopausal): Secondary | ICD-10-CM | POA: Diagnosis not present

## 2017-05-14 DIAGNOSIS — F411 Generalized anxiety disorder: Secondary | ICD-10-CM | POA: Diagnosis not present

## 2017-05-14 DIAGNOSIS — Z91040 Latex allergy status: Secondary | ICD-10-CM

## 2017-05-14 DIAGNOSIS — Z79899 Other long term (current) drug therapy: Secondary | ICD-10-CM | POA: Diagnosis not present

## 2017-05-14 DIAGNOSIS — Z811 Family history of alcohol abuse and dependence: Secondary | ICD-10-CM

## 2017-05-14 DIAGNOSIS — F4310 Post-traumatic stress disorder, unspecified: Secondary | ICD-10-CM | POA: Diagnosis not present

## 2017-05-14 DIAGNOSIS — Z818 Family history of other mental and behavioral disorders: Secondary | ICD-10-CM

## 2017-05-14 DIAGNOSIS — Z880 Allergy status to penicillin: Secondary | ICD-10-CM

## 2017-05-14 DIAGNOSIS — Z888 Allergy status to other drugs, medicaments and biological substances status: Secondary | ICD-10-CM

## 2017-05-14 DIAGNOSIS — Z881 Allergy status to other antibiotic agents status: Secondary | ICD-10-CM

## 2017-05-14 DIAGNOSIS — Z91013 Allergy to seafood: Secondary | ICD-10-CM

## 2017-05-14 DIAGNOSIS — Z791 Long term (current) use of non-steroidal anti-inflammatories (NSAID): Secondary | ICD-10-CM

## 2017-05-14 DIAGNOSIS — F319 Bipolar disorder, unspecified: Secondary | ICD-10-CM

## 2017-05-14 DIAGNOSIS — Z91018 Allergy to other foods: Secondary | ICD-10-CM

## 2017-05-14 DIAGNOSIS — Z885 Allergy status to narcotic agent status: Secondary | ICD-10-CM

## 2017-05-14 MED ORDER — GABAPENTIN 100 MG PO CAPS: 100.0000 mg | ORAL_CAPSULE | Freq: Two times a day (BID) | ORAL | 1 refills | Status: DC

## 2017-05-14 MED FILL — GABAPENTIN 100 MG CAPSULE: 100 | 30 days supply | Qty: 60 | Fill #0
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
## 2017-05-21 DIAGNOSIS — B373 Candidiasis of vulva and vagina: Secondary | ICD-10-CM | POA: Diagnosis not present

## 2017-05-21 MED FILL — TERCONAZOLE 0.4% VAG CREAM: 0.4 | 7 days supply | Qty: 45 | Fill #0

## 2017-05-21 MED FILL — PROGESTERONE 100 MG CAPSULE: 100 | 30 days supply | Qty: 30 | Fill #1

## 2017-05-23 ENCOUNTER — Encounter: Payer: Self-pay | Admitting: Obstetrics and Gynecology

## 2017-05-23 ENCOUNTER — Ambulatory Visit (INDEPENDENT_AMBULATORY_CARE_PROVIDER_SITE_OTHER): Payer: 59 | Admitting: Obstetrics and Gynecology

## 2017-05-23 VITALS — BP 100/64 | HR 68 | Resp 16 | Ht 60.5 in | Wt 133.0 lb

## 2017-05-23 DIAGNOSIS — Z8742 Personal history of other diseases of the female genital tract: Secondary | ICD-10-CM

## 2017-05-23 DIAGNOSIS — Z87898 Personal history of other specified conditions: Secondary | ICD-10-CM | POA: Diagnosis not present

## 2017-05-23 DIAGNOSIS — Z8619 Personal history of other infectious and parasitic diseases: Secondary | ICD-10-CM | POA: Diagnosis not present

## 2017-05-23 DIAGNOSIS — Z113 Encounter for screening for infections with a predominantly sexual mode of transmission: Secondary | ICD-10-CM

## 2017-05-23 DIAGNOSIS — Z3169 Encounter for other general counseling and advice on procreation: Secondary | ICD-10-CM

## 2017-05-23 DIAGNOSIS — R635 Abnormal weight gain: Secondary | ICD-10-CM | POA: Diagnosis not present
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
## 2017-05-24 ENCOUNTER — Telehealth: Payer: Self-pay | Admitting: Obstetrics and Gynecology

## 2017-05-24 LAB — HEP, RPR, HIV PANEL
HIV SCREEN 4TH GENERATION: NONREACTIVE
Hepatitis B Surface Ag: NEGATIVE
RPR: NONREACTIVE

## 2017-05-24 LAB — HEPATITIS C ANTIBODY: Hep C Virus Ab: <0.1 s/co ratio (ref 0.0–0.9)

## 2017-05-24 NOTE — Telephone Encounter (Signed)
Patient sent a message through [facility] asking if she is due for her tdap? This was a NGYN yesterday with Dr.Silva.

## 2017-05-24 NOTE — Telephone Encounter (Signed)
Spoke with patient. Advised per our records patient's last TDAP was given 2010. This will be due in 2020. Patient is agreeable and verbalizes understanding.  Routing to provider for final review. Patient agreeable to disposition. Will close encounter.

## 2017-06-01 DIAGNOSIS — R5383 Other fatigue: Secondary | ICD-10-CM | POA: Diagnosis not present

## 2017-06-11 ENCOUNTER — Ambulatory Visit (INDEPENDENT_AMBULATORY_CARE_PROVIDER_SITE_OTHER): Payer: 59 | Admitting: Obstetrics and Gynecology

## 2017-06-11 ENCOUNTER — Encounter: Payer: Self-pay | Admitting: Obstetrics and Gynecology

## 2017-06-11 VITALS — BP 112/70 | HR 68 | Resp 16 | Wt 133.0 lb

## 2017-06-11 DIAGNOSIS — R87610 Atypical squamous cells of undetermined significance on cytologic smear of cervix (ASC-US): Secondary | ICD-10-CM

## 2017-06-11 DIAGNOSIS — R87810 Cervical high risk human papillomavirus (HPV) DNA test positive: Secondary | ICD-10-CM

## 2017-06-11 DIAGNOSIS — Z113 Encounter for screening for infections with a predominantly sexual mode of transmission: Secondary | ICD-10-CM | POA: Diagnosis not present
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
## 2017-06-11 NOTE — Patient Instructions (Signed)
Colposcopy  Colposcopy is a procedure to examine the lowest part of the uterus (cervix), the vagina, and the area around the vaginal opening (vulva) for abnormalities or signs of disease. The procedure is done using a lighted microscope or magnifying lens (colposcope). If any unusual cells are found during the procedure, your health care provider may remove a tissue sample for testing (biopsy). A colposcopy may be done if you:   Have an abnormal Pap test. A Pap test is a screening test that is used to check for signs of cancer or infection of the vagina, cervix, and uterus.   Have a Pap smear test in which you test positive for high-risk HPV (human papillomavirus).   Have a sore or lesion on your cervix.   Have genital warts on your vulva, vagina, or cervix.   Took certain medicines while pregnant, such as diethylstilbestrol (DES).   Have pain during sexual intercourse.   Have vaginal bleeding, especially after sexual intercourse.   Need to have a cervical polyp removed.   Need to have a lost intrauterine device (IUD) string located.    Let your health care provider know about:   Any allergies you have, including allergies to prescribed medicine, latex, or iodine.   All medicines you are taking, including vitamins, herbs, eye drops, creams, and over-the-counter medicines. Bring a list of all of your medicines to your appointment.   Any problems you or family members have had with anesthetic medicines.   Any blood disorders you have.   Any surgeries you have had.   Any medical conditions you have, such as pelvic inflammatory disease (PID) or endometrial disorder.   Any history of frequent fainting.   Your menstrual cycle and what form of birth control (contraception) you use.   Your medical history, including any prior cervical treatment.   Whether you are pregnant or may be pregnant.  What are the risks?  Generally, this is a safe procedure. However, problems may occur,  including:   Pain.   Infection, which may include a fever, bad-smelling discharge, or pelvic pain.   Bleeding or discharge.   Misdiagnosis.   Fainting and vasovagal reactions, but this is rare.   Allergic reactions to medicines.   Damage to other structures or organs.    What happens before the procedure?   If you have your menstrual period or will have it at the time of your procedure, tell your health care provider. A colposcopy typically is not done during menstruation.   Continue your contraceptive practices before and after the procedure.   For 24 hours before the colposcopy:  ? Do not douche.  ? Do not use tampons.  ? Do not use medicines, creams, or suppositories in the vagina.  ? Do not have sexual intercourse.   Ask your health care provider about:  ? Changing or stopping your regular medicines. This is especially important if you are taking diabetes medicines or blood thinners.  ? Taking medicines such as aspirin and ibuprofen. These medicines can thin your blood. Do not take these medicines before your procedure if your health care provider instructs you not to. It is likely that your health care provider will tell you to avoid taking aspirin or medicine that contains aspirin for 7 days before the procedure.   Follow instructions from your health care provider about eating or drinking restrictions. You will likely need to eat a regular diet the day of the procedure and not skip any meals.   You   may have an exam or testing. A pregnancy test will be taken on the day of the procedure.   You may have a blood or urine sample taken.   Plan to have someone take you home from the hospital or clinic.   If you will be going home right after the procedure, plan to have someone with you for 24 hours.  What happens during the procedure?   You will lie down on your back, with your feet in foot rests (stirrups).   A warmed and lubricated instrument (speculum) will be inserted into your vagina. The  speculum will be used to hold apart the walls of your vagina so your health care provider can see your cervix and the inside of your vagina.   A cotton swab will be used to place a small amount of liquid solution on the areas to be examined. This solution makes it easier to see abnormal cells. You may feel a slight burning during this part.   The colposcope will be used to scan the cervix with a bright white light. The colposcope will be held near your vulvaand will magnify your vulva, vagina, and cervix for easier examination.   Your health care provider may decide to take a biopsy. If so:  ? You may be given medicine to numb the area (local anesthetic).  ? Surgical instruments will be used to suck out mucus and cells through your vagina.  ? You may feel mild pain while the tissue sample is removed.  ? Bleeding may occur. A solution may be used to stop the bleeding.  ? If a sample of tissue is needed from the inside of the cervix, a different procedure called endocervical curettage (ECC) may be completed. During this procedure, a curved instrument (curette) will be used to scrape cells from your cervix or the top of your cervix (endocervix).   Your health care provider will record the location of any abnormalities.  The procedure may vary among health care providers and hospitals.  What happens after the procedure?   You will lie down and rest for a few minutes. You may be offered juice or cookies.   Your blood pressure, heart rate, breathing rate, and blood oxygen level will be monitored until any medicines you were given have worn off.   You may have to wear compression stockings. These stockings help to prevent blood clots and reduce swelling in your legs.   You may have some cramping in your abdomen. This should go away after a few minutes.  This information is not intended to replace advice given to you by your health care provider. Make sure you discuss any questions you have with your health care  provider.  Document Released: 02/10/2003 Document Revised: 07/18/2016 Document Reviewed: 06/26/2016  Elsevier Interactive Patient Education  2018 [employer]

## 2017-06-13 LAB — VAGINITIS/VAGINOSIS, DNA PROBE
CANDIDA SPECIES: NEGATIVE
GARDNERELLA VAGINALIS: NEGATIVE
TRICHOMONAS VAG: NEGATIVE

## 2017-06-13 LAB — GC/CHLAMYDIA PROBE AMP
Chlamydia trachomatis, NAA: NEGATIVE
NEISSERIA GONORRHOEAE BY PCR: NEGATIVE

## 2017-06-15 MED FILL — PROGESTERONE 100 MG CAPSULE: 100 | 30 days supply | Qty: 30 | Fill #2

## 2017-06-18 ENCOUNTER — Telehealth: Payer: Self-pay | Admitting: Obstetrics and Gynecology

## 2017-06-18 ENCOUNTER — Ambulatory Visit (HOSPITAL_COMMUNITY): Payer: Self-pay | Admitting: Psychiatry

## 2017-06-18 NOTE — Telephone Encounter (Signed)
Spoke with patient regarding benefit for recommended Colposcopy. Patient understood and agreeable to benefit. Patient agreeable to schedule.   Patient asked about status of records from [facility]. Per Whitney Gonzalez, original request faxed 06-01-17. Most recent pap-smear received. Request was for all records in last 3 years. Whitney Gonzalez called and left voicemail on 06-13-17 with no response to date. Patient notified regarding this information. She understood and stated she will call their office to request records.   Patient noted Dr. Quincy Gonzalez discussed looking at these records prior to proceeding with procedure. Patient requested to schedule in August to allow more time for records. Patient scheduled 07-11-17 @ 10am. Patient aware to hydrate and have a small meal prior to appointment. Aware if ibuprofen is a medication she can take, she may take her normal dose 1 hour prior to appointment.   Patient aware and agreeable. Routing to provider for review. Will close encounter.

## 2017-06-24 ENCOUNTER — Telehealth: Payer: Self-pay | Admitting: Obstetrics and Gynecology

## 2017-06-24 ENCOUNTER — Encounter: Payer: Self-pay | Admitting: Obstetrics and Gynecology

## 2017-06-24 NOTE — Telephone Encounter (Addendum)
Please contact patient regarding her reports I received from [facility]. 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  She had her prior cryotherapy in 2015.  I know that her pap was ASCUS with positive HR HPV in May 2108 at [facility]. I would wait until November and repeat a pap at that time to make a decision about her need for another colposcopy.  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  I do not see a history of high grade disease documented.

## 2017-06-25 MED FILL — URO-MP CAPSULE: 118 | 8 days supply | Qty: 30 | Fill #1

## 2017-06-25 NOTE — Telephone Encounter (Signed)
Spoke with patient. Advised of message as seen below from [place]. Patient verbalizes understanding. Appointment for pap scheduled for 10/15/2017 at 2:30 pm with Dr.Silva. Patient is agreeable to date and time. Colposcopy for 07/11/17 cancelled. Recall placed for 11/18.  Routing to provider for final review. Patient agreeable to disposition. Will close encounter.

## 2017-07-02 DIAGNOSIS — H52223 Regular astigmatism, bilateral: Secondary | ICD-10-CM | POA: Diagnosis not present

## 2017-07-02 DIAGNOSIS — H5202 Hypermetropia, left eye: Secondary | ICD-10-CM | POA: Diagnosis not present

## 2017-07-11 ENCOUNTER — Ambulatory Visit: Payer: 59 | Admitting: Obstetrics and Gynecology

## 2017-07-17 MED FILL — PROGESTERONE 100 MG CAPSULE: 100 | 30 days supply | Qty: 30 | Fill #3

## 2017-07-23 ENCOUNTER — Telehealth: Payer: Self-pay | Admitting: Obstetrics and Gynecology

## 2017-07-23 NOTE — Telephone Encounter (Signed)
Just a quick note to clarify that the patient is taking Prometrium daily and not progesterone only contraceptives.

## 2017-07-23 NOTE — Telephone Encounter (Signed)
Spoke with patient. Reports irregular menses for last 3 cycles and menses is late for August. LMP 7/19-7/25. SA.  -  UPT x 2, negative.  -  Takes POP at night, reports no missed pills.  - breast tenderness and clear, watery vaginal discharge,     no odor.  - denies pain  Recommended OV for further evaluation, patient scheduled for 07/26/17 at 2:30pm with Dr. Quincy Simmonds. Advised patient would review with Dr. Quincy Simmonds and return call with any additional recommendations, patient is agreeable.   Routing to provider for final review. Patient is agreeable to disposition. Will close encounter.

## 2017-07-23 NOTE — Telephone Encounter (Signed)
Patient states her cycle is late and she has had a negative pregnancy test. She states her cycle has been very irregular.

## 2017-07-23 NOTE — Telephone Encounter (Signed)
Call to patient to clarify medication as previously stated, takes Progesterone 100 mg nightly to regulate cycles.   Routing to provider for final review. Patient is agreeable to disposition. Will close encounter. Patient is agreeable to disposition. Will close encounter.

## 2017-07-26 ENCOUNTER — Ambulatory Visit (INDEPENDENT_AMBULATORY_CARE_PROVIDER_SITE_OTHER): Payer: 59 | Admitting: Obstetrics and Gynecology

## 2017-07-26 VITALS — BP 124/82 | HR 76 | Ht 60.5 in | Wt 134.6 lb

## 2017-07-26 DIAGNOSIS — N926 Irregular menstruation, unspecified: Secondary | ICD-10-CM | POA: Diagnosis not present

## 2017-07-26 DIAGNOSIS — N760 Acute vaginitis: Secondary | ICD-10-CM

## 2017-07-26 DIAGNOSIS — Z319 Encounter for procreative management, unspecified: Secondary | ICD-10-CM | POA: Diagnosis not present

## 2017-07-26 DIAGNOSIS — Z8742 Personal history of other diseases of the female genital tract: Secondary | ICD-10-CM | POA: Diagnosis not present

## 2017-07-26 DIAGNOSIS — Z8759 Personal history of other complications of pregnancy, childbirth and the puerperium: Secondary | ICD-10-CM | POA: Diagnosis not present

## 2017-07-26 LAB — POCT URINE PREGNANCY: Preg Test, Ur: NEGATIVE
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
## 2017-07-27 ENCOUNTER — Telehealth: Payer: Self-pay

## 2017-07-27 LAB — VAGINITIS/VAGINOSIS, DNA PROBE
Candida Species: NEGATIVE
Gardnerella vaginalis: POSITIVE — AB
TRICHOMONAS VAG: NEGATIVE

## 2017-07-27 MED ORDER — METRONIDAZOLE 0.75 % VA GEL: 1.0000 | Freq: Every day | VAGINAL | 0 refills | Status: DC

## 2017-07-27 MED FILL — metroNIDAZOLE 0.75 % GEL: 0.75 | 5 days supply | Qty: 70 | Fill #0

## 2017-07-27 NOTE — Telephone Encounter (Signed)
Spoke with patient. Advised of results as seen below from [place]. Patient verbalizes understanding and would like treatment with Metrogel. Rx for Metrogel place 1 applicator vaginally at bedtime for 5 nights sent to pharmacy on file. Patient verbalizes understanding.

## 2017-07-27 NOTE — Telephone Encounter (Signed)
-----   Message from Nunzio Cobbs, MD sent at 07/27/2017  8:16 AM EDT ----- Please inform of Affirm result showing bacterial vaginosis. She may treat with Flagyl 500 mg po bid for 7 days or Metrogel pv at hs for 5 nights.  Please send Rx to pharmacy of choice. ETOH precautions.

## 2017-07-28 ENCOUNTER — Encounter: Payer: Self-pay | Admitting: Obstetrics and Gynecology

## 2017-07-31 DIAGNOSIS — N3000 Acute cystitis without hematuria: Secondary | ICD-10-CM | POA: Diagnosis not present

## 2017-08-02 MED FILL — FLUCONAZOLE 150 MG TABLET: 150 | 3 days supply | Qty: 2 | Fill #0

## 2017-08-02 MED FILL — NITROFURANTOIN MCR 100 MG C: 100 | 7 days supply | Qty: 14 | Fill #0

## 2017-08-10 MED FILL — PROGESTERONE 100 MG CAPSULE: 100 | 30 days supply | Qty: 30 | Fill #4

## 2017-08-13 DIAGNOSIS — N83292 Other ovarian cyst, left side: Secondary | ICD-10-CM | POA: Diagnosis not present

## 2017-08-13 DIAGNOSIS — Z3161 Procreative counseling and advice using natural family planning: Secondary | ICD-10-CM | POA: Diagnosis not present

## 2017-08-13 DIAGNOSIS — E288 Other ovarian dysfunction: Secondary | ICD-10-CM | POA: Diagnosis not present

## 2017-08-13 DIAGNOSIS — N83291 Other ovarian cyst, right side: Secondary | ICD-10-CM | POA: Diagnosis not present

## 2017-08-13 DIAGNOSIS — Z31430 Encounter of female for testing for genetic disease carrier status for procreative management: Secondary | ICD-10-CM | POA: Diagnosis not present

## 2017-08-13 DIAGNOSIS — N970 Female infertility associated with anovulation: Secondary | ICD-10-CM | POA: Diagnosis not present

## 2017-08-13 DIAGNOSIS — Z319 Encounter for procreative management, unspecified: Secondary | ICD-10-CM | POA: Diagnosis not present

## 2017-08-17 DIAGNOSIS — R309 Painful micturition, unspecified: Secondary | ICD-10-CM | POA: Diagnosis not present

## 2017-08-17 DIAGNOSIS — Z23 Encounter for immunization: Secondary | ICD-10-CM | POA: Diagnosis not present

## 2017-08-17 MED FILL — OVIDREL 250 MCG/0.5 ML SYRG: 250 | 1 days supply | Qty: 1 | Fill #0

## 2017-08-17 MED FILL — LETROZOLE 2.5 MG TABLET: 2.5 | 5 days supply | Qty: 15 | Fill #0

## 2017-09-14 DIAGNOSIS — N926 Irregular menstruation, unspecified: Secondary | ICD-10-CM | POA: Diagnosis not present

## 2017-09-17 DIAGNOSIS — N926 Irregular menstruation, unspecified: Secondary | ICD-10-CM | POA: Diagnosis not present

## 2017-09-29 ENCOUNTER — Encounter: Payer: Self-pay | Admitting: Emergency Medicine

## 2017-09-29 ENCOUNTER — Emergency Department
Admission: EM | Admit: 2017-09-29 | Discharge: 2017-09-29 | Disposition: A | Discharge status: Home / Self Care | Payer: 59 | Source: Home / Self Care | Attending: Family Medicine | Admitting: Family Medicine

## 2017-09-29 DIAGNOSIS — R300 Dysuria: Secondary | ICD-10-CM | POA: Diagnosis not present

## 2017-09-29 LAB — POCT URINALYSIS DIP (MANUAL ENTRY)
GLUCOSE UA: NEGATIVE mg/dL
Nitrite, UA: NEGATIVE
Protein Ur, POC: 100 mg/dL — AB
SPEC GRAV UA: 1.02 (ref 1.010–1.025)
Urobilinogen, UA: 0.2 E.U./dL
pH, UA: 8.5 — AB (ref 5.0–8.0)

## 2017-09-29 MED ORDER — FLUCONAZOLE 150 MG PO TABS: 150.0000 mg | ORAL_TABLET | Freq: Once | ORAL | 0 refills | Status: DC

## 2017-09-29 MED ORDER — NITROFURANTOIN MONOHYD MACRO 100 MG PO CAPS: 100.0000 mg | ORAL_CAPSULE | Freq: Two times a day (BID) | ORAL | 0 refills | Status: DC

## 2017-09-29 NOTE — Discharge Instructions (Signed)
Increase fluid intake. °If symptoms become significantly worse during the night or over the weekend, proceed to the local emergency room.  °

## 2017-09-29 NOTE — ED Triage Notes (Signed)
Patient has frequent UTIs and Painful Bladder Syndrom; yesterday noticed small hematuria; today dysuria and hematuria; took OTC causing urine to be blue. Usually is placed on Macrodantin and Diflucan.
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
## 2017-10-01 LAB — URINE CULTURE
MICRO NUMBER:: 81207314
SPECIMEN QUALITY: ADEQUATE

## 2017-10-02 ENCOUNTER — Telehealth: Payer: Self-pay | Admitting: *Deleted

## 2017-10-02 NOTE — Telephone Encounter (Signed)
Callback: Patient report she is improving some. UCX results given. Encouraged to complete antibiotic course.

## 2017-10-05 ENCOUNTER — Telehealth: Payer: Self-pay | Admitting: *Deleted

## 2017-10-05 NOTE — Telephone Encounter (Signed)
Patient reports she is on her last dose of Macrobid and is still experiencing dysuria. She is asking if she can have another dose of Macrobid anda refill on the diflucan if she will take another round. She has multiple allergies, she usually takes Macrobid for [employer]

## 2017-10-05 NOTE — Telephone Encounter (Signed)
Please have patient follow up with her PCP, GYN or urologist due to hx of IC. Symptoms could be due to IC or persistent infection. No sensitivity was performed, hard to say how much bacteria grew during most recent UTI.

## 2017-10-05 NOTE — Telephone Encounter (Signed)
Patient advised of Erin's instructions. She will call her urologist now.

## 2017-10-08 DIAGNOSIS — G479 Sleep disorder, unspecified: Secondary | ICD-10-CM | POA: Diagnosis not present

## 2017-10-08 DIAGNOSIS — R300 Dysuria: Secondary | ICD-10-CM | POA: Diagnosis not present

## 2017-10-08 MED FILL — URO-MP CAPSULE: 118 | 10 days supply | Qty: 30 | Fill #0

## 2017-10-11 ENCOUNTER — Other Ambulatory Visit (HOSPITAL_BASED_OUTPATIENT_CLINIC_OR_DEPARTMENT_OTHER): Payer: Self-pay

## 2017-10-11 DIAGNOSIS — G4700 Insomnia, unspecified: Secondary | ICD-10-CM

## 2017-10-11 DIAGNOSIS — R0689 Other abnormalities of breathing: Secondary | ICD-10-CM

## 2017-10-11 DIAGNOSIS — G4710 Hypersomnia, unspecified: Secondary | ICD-10-CM

## 2017-10-11 DIAGNOSIS — R5383 Other fatigue: Secondary | ICD-10-CM

## 2017-10-15 ENCOUNTER — Ambulatory Visit: Payer: 59 | Admitting: Obstetrics and Gynecology

## 2017-10-17 ENCOUNTER — Ambulatory Visit (HOSPITAL_BASED_OUTPATIENT_CLINIC_OR_DEPARTMENT_OTHER): Payer: 59 | Attending: Internal Medicine | Admitting: Internal Medicine

## 2017-10-17 ENCOUNTER — Ambulatory Visit: Payer: 59 | Admitting: Obstetrics and Gynecology

## 2017-10-17 ENCOUNTER — Encounter: Payer: Self-pay | Admitting: Obstetrics and Gynecology

## 2017-10-17 ENCOUNTER — Other Ambulatory Visit: Payer: Self-pay

## 2017-10-17 ENCOUNTER — Other Ambulatory Visit (HOSPITAL_COMMUNITY)
Admission: RE | Admit: 2017-10-17 | Discharge: 2017-10-17 | Disposition: A | Discharge status: Home / Self Care | Payer: 59 | Source: Ambulatory Visit | Attending: Obstetrics and Gynecology | Admitting: Obstetrics and Gynecology

## 2017-10-17 VITALS — BP 114/80 | HR 72 | Resp 16 | Wt 135.0 lb

## 2017-10-17 DIAGNOSIS — R300 Dysuria: Secondary | ICD-10-CM

## 2017-10-17 DIAGNOSIS — R319 Hematuria, unspecified: Secondary | ICD-10-CM | POA: Diagnosis not present

## 2017-10-17 DIAGNOSIS — Z09 Encounter for follow-up examination after completed treatment for conditions other than malignant neoplasm: Secondary | ICD-10-CM | POA: Insufficient documentation

## 2017-10-17 DIAGNOSIS — Z8619 Personal history of other infectious and parasitic diseases: Secondary | ICD-10-CM | POA: Diagnosis not present

## 2017-10-17 DIAGNOSIS — R0689 Other abnormalities of breathing: Secondary | ICD-10-CM

## 2017-10-17 DIAGNOSIS — R87610 Atypical squamous cells of undetermined significance on cytologic smear of cervix (ASC-US): Secondary | ICD-10-CM | POA: Diagnosis not present

## 2017-10-17 DIAGNOSIS — R5383 Other fatigue: Secondary | ICD-10-CM | POA: Diagnosis not present

## 2017-10-17 DIAGNOSIS — G4700 Insomnia, unspecified: Secondary | ICD-10-CM | POA: Insufficient documentation

## 2017-10-17 DIAGNOSIS — R87810 Cervical high risk human papillomavirus (HPV) DNA test positive: Secondary | ICD-10-CM | POA: Diagnosis not present

## 2017-10-17 DIAGNOSIS — G4710 Hypersomnia, unspecified: Secondary | ICD-10-CM

## 2017-10-17 LAB — POCT URINALYSIS DIPSTICK
Bilirubin, UA: NEGATIVE
GLUCOSE UA: NEGATIVE
Ketones, UA: NEGATIVE
LEUKOCYTES UA: NEGATIVE
Nitrite, UA: NEGATIVE
PROTEIN UA: NEGATIVE
UROBILINOGEN UA: 0.2 U/dL
pH, UA: 5 (ref 5.0–8.0)

## 2017-10-17 NOTE — Addendum Note (Signed)
Addended by: Lowella Fairy on: 10/17/2017 09:19 AM   Modules accepted: Orders
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
## 2017-10-18 LAB — URINALYSIS, MICROSCOPIC ONLY: Casts: NONE SEEN /lpf

## 2017-10-18 LAB — URINE CULTURE

## 2017-10-19 LAB — CYTOLOGY - PAP: DIAGNOSIS: NEGATIVE

## 2017-11-12 DIAGNOSIS — G4710 Hypersomnia, unspecified: Secondary | ICD-10-CM | POA: Diagnosis not present

## 2017-11-12 NOTE — Procedures (Signed)
    NAME: Whitney Gonzalez DATE OF BIRTH:  [birth year] MEDICAL RECORD NUMBER [id number]  LOCATION: [facility]  PHYSICIAN: Marius Ditch  DATE OF STUDY: 10/17/2017  SLEEP STUDY TYPE: Out of Center Sleep Test                REFERRING PHYSICIAN: London Pepper, MD  INDICATION FOR STUDY: awakening gasping for breath, excessive daytime sleepiness, insomnia  EPWORTH SLEEPINESS SCORE:  NA HEIGHT: 5' (152.4 cm)  WEIGHT: 135 lb (61.2 kg)    Body mass index is 26.37 kg/m.  NECK SIZE: 12 in.  Important co-morbid conditions: bipolar disorder Medications reviewed in eCW at [facility].  Conclusions: 1. No significant sleep disordered breathing (Respiratory Event Index (REI) 2.5/hr) - please note that REI approximates AHI but is not identical as a HST measures time of use and not time of sleep. 2. No desaturations (min O2 sat: 82%; time below 89%: 0 minutes [0% of monitored time]; oxygen desaturation index: 0/hr)  Recommendations: 1. There is no indication for CPAP or any other intervention for sleep disordered breathing.  2. Please note that a HST may underestimate the degree of obstructive sleep apnea due to the lack of EEG data. Therefore, respiratory effort related arousals (RERAs) will not be measured. If a prominent part of the patient's sleep disordered breathing are RERAs, then the HST will understate the severity of the patient's obstructive sleep apnea.  3. Please note that AASM recommends an in-lab sleep test for patients with a negative HST who have a high pretest likelihood of sleep apnea. If pretest likelihood is not thought to be high, the test excludes significant sleep apnea.    I certify that I have reviewed the entire raw data recording prior to the issuance of this report in accordance with the Standards of the American Academy of Sleep Medicine (AASM).   Marius Ditch Sleep specialist, American Board of Sleep Medicine  ELECTRONICALLY  SIGNED ON:  11/12/2017, 4:58 PM [place] PH: [phone]   FX: [phone] [place]

## 2017-11-15 DIAGNOSIS — D1801 Hemangioma of skin and subcutaneous tissue: Secondary | ICD-10-CM | POA: Diagnosis not present

## 2017-11-15 DIAGNOSIS — L814 Other melanin hyperpigmentation: Secondary | ICD-10-CM | POA: Diagnosis not present

## 2017-11-15 DIAGNOSIS — L700 Acne vulgaris: Secondary | ICD-10-CM | POA: Diagnosis not present

## 2017-11-15 DIAGNOSIS — T300 Burn of unspecified body region, unspecified degree: Secondary | ICD-10-CM | POA: Diagnosis not present

## 2017-11-15 MED FILL — SSD 1% CREAM: 1 | 10 days supply | Qty: 50 | Fill #0

## 2017-11-15 MED FILL — CLIND PH-BENZOYL PEROX 1.2-: 1.2-5 | 30 days supply | Qty: 45 | Fill #0

## 2017-12-11 DIAGNOSIS — R102 Pelvic and perineal pain: Secondary | ICD-10-CM | POA: Diagnosis not present

## 2017-12-11 DIAGNOSIS — Z31430 Encounter of female for testing for genetic disease carrier status for procreative management: Secondary | ICD-10-CM | POA: Diagnosis not present

## 2017-12-11 DIAGNOSIS — N946 Dysmenorrhea, unspecified: Secondary | ICD-10-CM | POA: Diagnosis not present

## 2017-12-11 DIAGNOSIS — E288 Other ovarian dysfunction: Secondary | ICD-10-CM | POA: Diagnosis not present

## 2017-12-11 DIAGNOSIS — N83291 Other ovarian cyst, right side: Secondary | ICD-10-CM | POA: Diagnosis not present

## 2017-12-11 DIAGNOSIS — N970 Female infertility associated with anovulation: Secondary | ICD-10-CM | POA: Diagnosis not present

## 2017-12-12 DIAGNOSIS — N83292 Other ovarian cyst, left side: Secondary | ICD-10-CM | POA: Diagnosis not present

## 2017-12-12 DIAGNOSIS — E288 Other ovarian dysfunction: Secondary | ICD-10-CM | POA: Diagnosis not present

## 2017-12-12 DIAGNOSIS — Z3141 Encounter for fertility testing: Secondary | ICD-10-CM | POA: Diagnosis not present

## 2017-12-12 MED FILL — LETROZOLE 2.5 MG TABLET: 2.5 | 30 days supply | Qty: 30 | Fill #0

## 2017-12-12 MED FILL — NORETHINDRONE 5 MG TABLET: 5 | 30 days supply | Qty: 30 | Fill #0

## 2018-01-07 MED FILL — LETROZOLE 2.5 MG TABLET: 2.5 | 30 days supply | Qty: 30 | Fill #1

## 2018-01-07 MED FILL — NORETHINDRONE 5 MG TABLET: 5 | 30 days supply | Qty: 30 | Fill #1

## 2018-01-16 DIAGNOSIS — N939 Abnormal uterine and vaginal bleeding, unspecified: Secondary | ICD-10-CM | POA: Diagnosis not present

## 2018-01-29 DIAGNOSIS — N879 Dysplasia of cervix uteri, unspecified: Secondary | ICD-10-CM | POA: Diagnosis not present

## 2018-02-11 ENCOUNTER — Telehealth: Payer: Self-pay | Admitting: Obstetrics and Gynecology

## 2018-02-11 NOTE — Telephone Encounter (Signed)
Spoke with patient. Patient states that she was seeing Lesia Sago, NP with [facility] for treatment of endometriosis as referred by Dr.Silva. States she was taking 2 pills for treatment. Unsure of medication name. Started having bleeding x 3 weeks and felt like vaginal tissue was peeling and raw. Was seen with Lesia Sago, NP and taken off her medication 2 weeks ago, told to follow up with Dr.Silva. Patient has been off medication x 2 weeks and is having vaginal peeling with itching. Denies any discharge. Feels the vaginal tissue is "sticky." Has not had intercourse. Appointment scheduled for 02/13/2018 with Dr.Silva at 10:30 am. Patient is agreeable to date and time. Declines all other times.  Routing to provider for final review. Patient agreeable to disposition. Will close encounter.

## 2018-02-11 NOTE — Telephone Encounter (Signed)
Patient would like to discuss birth control options and she is not sure if she has some type of infection.

## 2018-02-13 ENCOUNTER — Ambulatory Visit: Payer: 59 | Admitting: Obstetrics and Gynecology

## 2018-02-13 ENCOUNTER — Encounter: Payer: Self-pay | Admitting: Obstetrics and Gynecology

## 2018-02-13 VITALS — BP 120/74 | HR 76 | Ht 60.5 in | Wt 135.4 lb

## 2018-02-13 DIAGNOSIS — N939 Abnormal uterine and vaginal bleeding, unspecified: Secondary | ICD-10-CM

## 2018-02-13 DIAGNOSIS — N760 Acute vaginitis: Secondary | ICD-10-CM

## 2018-02-13 LAB — POCT URINE PREGNANCY: Preg Test, Ur: NEGATIVE

## 2018-02-13 MED ORDER — NYSTATIN-TRIAMCINOLONE 100000-0.1 UNIT/GM-% EX CREA: 1.0000 "application " | TOPICAL_CREAM | Freq: Two times a day (BID) | CUTANEOUS | 0 refills | Status: DC

## 2018-02-13 MED ORDER — FLUCONAZOLE 150 MG PO TABS: 150.0000 mg | ORAL_TABLET | Freq: Once | ORAL | 0 refills | Status: DC

## 2018-02-13 MED ORDER — NORETHINDRONE 0.35 MG PO TABS: 1.0000 | ORAL_TABLET | Freq: Every day | ORAL | 0 refills | Status: DC

## 2018-02-13 MED FILL — FLUCONAZOLE 150 MG TABLET: 150 | 3 days supply | Qty: 2 | Fill #0

## 2018-02-13 MED FILL — NORETHINDRONE 0.35 MG TAB: 0.35 | 84 days supply | Qty: 84 | Fill #0

## 2018-02-13 MED FILL — NYSTATIN-TRIAMCINOLONE CRM: 100000-0.1 | 7 days supply | Qty: 60 | Fill #0
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
## 2018-02-14 LAB — VAGINITIS/VAGINOSIS, DNA PROBE
Candida Species: NEGATIVE
Gardnerella vaginalis: NEGATIVE
Trichomonas vaginosis: NEGATIVE

## 2018-02-19 ENCOUNTER — Telehealth: Payer: Self-pay | Admitting: Obstetrics and Gynecology

## 2018-02-19 MED FILL — CLIND PH-BENZOYL PEROX 1.2-: 1.2-5 | 30 days supply | Qty: 45 | Fill #1

## 2018-02-19 NOTE — Telephone Encounter (Signed)
Spoke with patient. Patient states that she is unsure if she had a menses last month. Reports she was taking hormones prescribed by fertility specialist and had dark brown spotting all February while taking the medication. Stopped taking the medication in March and had dark brown spotting for two days. Had intercourse following. Feels she is having pregnancy symptoms. Had a UPT in office on 02/13/2018 which was negative. Advised if she has concerns for pregnancy to take another UPT. States if she did cycle in February she is 1 week late, but if she did not it has been 76-78 days since last cycle. Asking for recommendations as she has been unable to start POP due to late menses. Patient will take UPT tomorrow morning. Advised will speak with Dr.Silva tomorrow morning and return call. Patient is agreeable.

## 2018-02-19 NOTE — Telephone Encounter (Signed)
Dr. Quincy Simmonds advised patient to update. Patient is not sure if she has had a true period and is supposed to start birth control. Had some spotting last month that she does not believe was true period; if it was not she is around 72 days late. Patient will be at work after 1 pm 02/20/18.

## 2018-02-20 NOTE — Telephone Encounter (Signed)
She should wait to start her birth control pills with her next menstrual cycle.  Please call back if her cycle does not begin.  It is hard to say she is really late for her period due to all of the irregular bleeding she had last month.  She is starting her menstrual calendar all over again due to her being off of the hormonal medication she had been on.  I agree with repeating the UPT.  I hope this clarifies.

## 2018-02-21 NOTE — Telephone Encounter (Signed)
Spoke with patient. Advised of message as seen below from Dr.Silva. Patient verbalizes understanding. Encounter closed. 

## 2018-02-25 DIAGNOSIS — K529 Noninfective gastroenteritis and colitis, unspecified: Secondary | ICD-10-CM | POA: Diagnosis not present

## 2018-03-01 ENCOUNTER — Telehealth: Payer: Self-pay | Admitting: Obstetrics and Gynecology

## 2018-03-01 NOTE — Telephone Encounter (Signed)
Patient calling stating was given Micronor 02-13-18 for AUB and she has started yet because she hasn't had a cycle since 12-03-18. She's had 3 neg home pregnancy tests and she's just concerned and not sure what to do. Advised will discuss with Dr.Silva and give her a call back later today or it may be Monday. She states that will be fine and it is okay to leave a detailed message on voicemail. Routed to Dr.Silva

## 2018-03-01 NOTE — Telephone Encounter (Signed)
Patient is asking to talk with a nurse about her irregular cycles. Patient states that her last cycle was December 03, 2017.  Patient states she will only be available between 1:00pm and 4:00pm.

## 2018-03-02 NOTE — Telephone Encounter (Signed)
Please have patient return to the office for a brief visit with me to review her bleeding profile and determine plan for starting her Micronor.  She bled most of the month of February and I am not clear about her current potential for exposure to pregnancy.

## 2018-03-04 NOTE — Telephone Encounter (Signed)
Left message to call Kaitlyn at [phone]. 

## 2018-03-04 NOTE — Telephone Encounter (Signed)
Patient returned call. Ok to leave a detailed message if she does not answer.

## 2018-03-04 NOTE — Telephone Encounter (Signed)
Appointment scheduled for 03/06/2018 at 9:30 am with Dr.Silva. Patient verbalizes understanding and is agreeable to date and time. Encounter closed.

## 2018-03-06 ENCOUNTER — Encounter: Payer: Self-pay | Admitting: Obstetrics and Gynecology

## 2018-03-06 ENCOUNTER — Ambulatory Visit: Payer: 59 | Admitting: Obstetrics and Gynecology

## 2018-03-06 ENCOUNTER — Other Ambulatory Visit: Payer: Self-pay

## 2018-03-06 VITALS — BP 112/70 | HR 76 | Resp 14 | Ht 60.0 in | Wt 136.0 lb

## 2018-03-06 DIAGNOSIS — N912 Amenorrhea, unspecified: Secondary | ICD-10-CM

## 2018-03-06 DIAGNOSIS — N926 Irregular menstruation, unspecified: Secondary | ICD-10-CM

## 2018-03-06 DIAGNOSIS — R350 Frequency of micturition: Secondary | ICD-10-CM

## 2018-03-06 LAB — POCT URINALYSIS DIPSTICK
Bilirubin, UA: NEGATIVE
Blood, UA: NEGATIVE
GLUCOSE UA: NEGATIVE
Ketones, UA: NEGATIVE
LEUKOCYTES UA: NEGATIVE
Nitrite, UA: NEGATIVE
PROTEIN UA: NEGATIVE
Urobilinogen, UA: 0.2 E.U./dL
pH, UA: 5 (ref 5.0–8.0)

## 2018-03-06 LAB — POCT URINE PREGNANCY: PREG TEST UR: NEGATIVE
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
## 2018-03-06 NOTE — Patient Instructions (Signed)
Norethindrone tablets (contraception) What is this medicine? NORETHINDRONE (nor eth IN drone) is an oral contraceptive. The product contains a female hormone known as a progestin. It is used to prevent pregnancy. This medicine may be used for other purposes; ask your health care provider or pharmacist if you have questions. COMMON BRAND NAME(S): Camila, Deblitane 28-Day, [place], Heather, [place], [place], [place], Nor-QD, Nora-BE, Norlyroc, Ortho Micronor, [employer] 28-Day What should I tell my health care provider before I take this medicine? They need to know if you have any of these conditions: -blood vessel disease or blood clots -breast, cervical, or vaginal cancer -diabetes -heart disease -kidney disease -liver disease -mental depression -migraine -seizures -stroke -vaginal bleeding -an unusual or allergic reaction to norethindrone, other medicines, foods, dyes, or preservatives -pregnant or trying to get pregnant -breast-feeding How should I use this medicine? Take this medicine by mouth with a glass of water. You may take it with or without food. Follow the directions on the prescription label. Take this medicine at the same time each day and in the order directed on the package. Do not take your medicine more often than directed. Contact your pediatrician regarding the use of this medicine in children. Special care may be needed. This medicine has been used in female children who have started having menstrual periods. A patient package insert for the product will be given with each prescription and refill. Read this sheet carefully each time. The sheet may change frequently. Overdosage: If you think you have taken too much of this medicine contact a poison control center or emergency room at once. NOTE: This medicine is only for you. Do not share this medicine with others. What if I miss a dose? Try not to miss a dose. Every time you miss a dose or take a dose late your chance of  pregnancy increases. When 1 pill is missed (even if only 3 hours late), take the missed pill as soon as possible and continue taking a pill each day at the regular time (use a back up method of birth control for the next 48 hours). If more than 1 dose is missed, use an additional birth control method for the rest of your pill pack until menses occurs. Contact your health care professional if more than 1 dose has been missed. What may interact with this medicine? Do not take this medicine with any of the following medications: -amprenavir or fosamprenavir -bosentan This medicine may also interact with the following medications: -antibiotics or medicines for infections, especially rifampin, rifabutin, rifapentine, and griseofulvin, and possibly penicillins or tetracyclines -aprepitant -barbiturate medicines, such as phenobarbital -carbamazepine -felbamate -modafinil -oxcarbazepine -phenytoin -ritonavir or other medicines for HIV infection or AIDS -St. John's wort -topiramate This list may not describe all possible interactions. Give your health care provider a list of all the medicines, herbs, non-prescription drugs, or dietary supplements you use. Also tell them if you smoke, drink alcohol, or use illegal drugs. Some items may interact with your medicine. What should I watch for while using this medicine? Visit your doctor or health care professional for regular checks on your progress. You will need a regular breast and pelvic exam and Pap smear while on this medicine. Use an additional method of birth control during the first cycle that you take these tablets. If you have any reason to think you are pregnant, stop taking this medicine right away and contact your doctor or health care professional. If you are taking this medicine for hormone related problems, it  may take several cycles of use to see improvement in your condition. This medicine does not protect you against HIV infection (AIDS)  or any other sexually transmitted diseases. What side effects may I notice from receiving this medicine? Side effects that you should report to your doctor or health care professional as soon as possible: -breast tenderness or discharge -pain in the abdomen, chest, groin or leg -severe headache -skin rash, itching, or hives -sudden shortness of breath -unusually weak or tired -vision or speech problems -yellowing of skin or eyes Side effects that usually do not require medical attention (report to your doctor or health care professional if they continue or are bothersome): -changes in sexual desire -change in menstrual flow -facial hair growth -fluid retention and swelling -headache -irritability -nausea -weight gain or loss This list may not describe all possible side effects. Call your doctor for medical advice about side effects. You may report side effects to FDA at 1-800-FDA-1088. Where should I keep my medicine? Keep out of the reach of children. Store at room temperature between 15 and 30 degrees C (59 and 86 degrees F). Throw away any unused medicine after the expiration date. NOTE: This sheet is a summary. It may not cover all possible information. If you have questions about this medicine, talk to your doctor, pharmacist, or health care provider.  2018 Elsevier/Gold Standard (2012-08-09 16:41:35)  

## 2018-03-07 LAB — BETA HCG QUANT (REF LAB)

## 2018-03-07 LAB — PROLACTIN: Prolactin: 13.1 ng/mL (ref 4.8–23.3)

## 2018-03-07 LAB — TSH: TSH: 1.91 u[IU]/mL (ref 0.450–4.500)

## 2018-04-05 MED FILL — CLIND PH-BENZOYL PEROX 1.2-: 1.2-5 | 30 days supply | Qty: 45 | Fill #2

## 2018-04-18 DIAGNOSIS — L219 Seborrheic dermatitis, unspecified: Secondary | ICD-10-CM | POA: Diagnosis not present

## 2018-04-18 DIAGNOSIS — L814 Other melanin hyperpigmentation: Secondary | ICD-10-CM | POA: Diagnosis not present

## 2018-04-18 DIAGNOSIS — L700 Acne vulgaris: Secondary | ICD-10-CM | POA: Diagnosis not present

## 2018-04-18 DIAGNOSIS — D1801 Hemangioma of skin and subcutaneous tissue: Secondary | ICD-10-CM | POA: Diagnosis not present

## 2018-04-18 DIAGNOSIS — D2230 Melanocytic nevi of unspecified part of face: Secondary | ICD-10-CM | POA: Diagnosis not present

## 2018-05-13 ENCOUNTER — Encounter: Payer: Self-pay | Admitting: Obstetrics and Gynecology

## 2018-05-13 ENCOUNTER — Ambulatory Visit (INDEPENDENT_AMBULATORY_CARE_PROVIDER_SITE_OTHER): Payer: 59 | Admitting: Obstetrics and Gynecology

## 2018-05-13 ENCOUNTER — Other Ambulatory Visit (HOSPITAL_COMMUNITY)
Admission: RE | Admit: 2018-05-13 | Discharge: 2018-05-13 | Disposition: A | Discharge status: Home / Self Care | Payer: 59 | Source: Ambulatory Visit | Attending: Obstetrics and Gynecology | Admitting: Obstetrics and Gynecology

## 2018-05-13 ENCOUNTER — Other Ambulatory Visit: Payer: Self-pay

## 2018-05-13 VITALS — BP 110/62 | HR 84 | Resp 16 | Ht 60.25 in | Wt 134.0 lb

## 2018-05-13 DIAGNOSIS — Z113 Encounter for screening for infections with a predominantly sexual mode of transmission: Secondary | ICD-10-CM

## 2018-05-13 DIAGNOSIS — Z8619 Personal history of other infectious and parasitic diseases: Secondary | ICD-10-CM | POA: Insufficient documentation

## 2018-05-13 DIAGNOSIS — Z01419 Encounter for gynecological examination (general) (routine) without abnormal findings: Secondary | ICD-10-CM | POA: Diagnosis not present

## 2018-05-13 DIAGNOSIS — N6320 Unspecified lump in the left breast, unspecified quadrant: Secondary | ICD-10-CM

## 2018-05-13 MED ORDER — NORETHINDRONE 0.35 MG PO TABS: 1.0000 | ORAL_TABLET | Freq: Every day | ORAL | 3 refills | Status: DC

## 2018-05-13 MED FILL — CLIND PH-BENZOYL PEROX 1.2-: 1.2-5 | 30 days supply | Qty: 45 | Fill #0

## 2018-05-13 MED FILL — NORETHINDRONE 0.35 MG TAB: 0.35 | 84 days supply | Qty: 84 | Fill #0
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
## 2018-05-13 NOTE — Patient Instructions (Signed)

## 2018-05-14 LAB — CYTOLOGY - PAP
CHLAMYDIA, DNA PROBE: NEGATIVE
DIAGNOSIS: NEGATIVE
Neisseria Gonorrhea: NEGATIVE
Trichomonas: NEGATIVE

## 2018-05-14 LAB — HEPATITIS C ANTIBODY: Hep C Virus Ab: <0.1 s/co ratio (ref 0.0–0.9)

## 2018-05-14 LAB — HEP, RPR, HIV PANEL
HIV SCREEN 4TH GENERATION: NONREACTIVE
Hepatitis B Surface Ag: NEGATIVE
RPR Ser Ql: NONREACTIVE

## 2018-05-20 MED FILL — URO-MP CAPSULE: 118 | 10 days supply | Qty: 30 | Fill #1

## 2018-05-20 MED FILL — VENTOLIN HFA 90 MCG INHALER: 108 (90 BAS | 16 days supply | Qty: 18 | Fill #0

## 2018-05-21 MED FILL — RIZATRIPTAN BENZOATE 10 MG: 10 | 90 days supply | Qty: 27 | Fill #0

## 2018-06-18 DIAGNOSIS — H52223 Regular astigmatism, bilateral: Secondary | ICD-10-CM | POA: Diagnosis not present

## 2018-06-24 ENCOUNTER — Ambulatory Visit: Payer: Self-pay | Admitting: Obstetrics and Gynecology

## 2018-06-24 ENCOUNTER — Encounter: Payer: Self-pay | Admitting: Obstetrics and Gynecology

## 2018-06-24 ENCOUNTER — Telehealth: Payer: Self-pay | Admitting: Obstetrics and Gynecology

## 2018-06-24 DIAGNOSIS — N6459 Other signs and symptoms in breast: Secondary | ICD-10-CM | POA: Diagnosis not present

## 2018-06-24 NOTE — Telephone Encounter (Signed)
Patient cancelled 6 week breast recheck for today. She was seen at her pcp office for this and will have them fax records over.

## 2018-08-10 ENCOUNTER — Encounter: Payer: Self-pay | Admitting: Emergency Medicine

## 2018-08-10 ENCOUNTER — Other Ambulatory Visit: Payer: Self-pay

## 2018-08-10 ENCOUNTER — Emergency Department
Admission: EM | Admit: 2018-08-10 | Discharge: 2018-08-10 | Disposition: A | Discharge status: Home / Self Care | Payer: Self-pay | Source: Home / Self Care | Attending: Family Medicine | Admitting: Family Medicine

## 2018-08-10 DIAGNOSIS — R300 Dysuria: Secondary | ICD-10-CM

## 2018-08-10 DIAGNOSIS — N3001 Acute cystitis with hematuria: Secondary | ICD-10-CM

## 2018-08-10 LAB — POCT URINE PREGNANCY: Preg Test, Ur: NEGATIVE

## 2018-08-10 MED ORDER — FLUCONAZOLE 150 MG PO TABS: 150.0000 mg | ORAL_TABLET | Freq: Once | ORAL | 1 refills | Status: DC

## 2018-08-10 MED ORDER — NITROFURANTOIN MONOHYD MACRO 100 MG PO CAPS: 100.0000 mg | ORAL_CAPSULE | Freq: Two times a day (BID) | ORAL | 0 refills | Status: DC
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
## 2018-08-10 NOTE — ED Triage Notes (Signed)
Pt c.o urinary symptoms, frequency, burning, low back pain and pressure. Also complains of a HA and neck pain. She states she's felt really nauseous.

## 2018-08-10 NOTE — Discharge Instructions (Signed)
°  Please take your antibiotic as prescribed. A urine culture has been sent to check the severity of your urinary infection and to determine if you are on the most appropriate antibiotic. The results should come back within 2-3 days and you will be notified even if no medication change is needed.  Please stay well hydrated and follow up with your family doctor in 1 week if not improving, sooner if worsening.  

## 2018-08-12 ENCOUNTER — Telehealth: Payer: Self-pay | Admitting: Emergency Medicine

## 2018-08-12 LAB — URINE CULTURE
MICRO NUMBER:: 91073112
SPECIMEN QUALITY:: ADEQUATE

## 2018-10-14 ENCOUNTER — Inpatient Hospital Stay (HOSPITAL_COMMUNITY): Payer: Medicaid Other

## 2018-10-14 ENCOUNTER — Other Ambulatory Visit: Payer: Self-pay

## 2018-10-14 ENCOUNTER — Inpatient Hospital Stay (HOSPITAL_COMMUNITY)
Admission: AD | Admit: 2018-10-14 | Discharge: 2018-10-14 | Disposition: A | Discharge status: Home / Self Care | Payer: Medicaid Other | Source: Ambulatory Visit | Attending: Obstetrics and Gynecology | Admitting: Obstetrics and Gynecology

## 2018-10-14 ENCOUNTER — Encounter (HOSPITAL_COMMUNITY): Payer: Self-pay | Admitting: *Deleted

## 2018-10-14 ENCOUNTER — Telehealth: Payer: Self-pay | Admitting: Obstetrics and Gynecology

## 2018-10-14 DIAGNOSIS — O99341 Other mental disorders complicating pregnancy, first trimester: Secondary | ICD-10-CM | POA: Diagnosis not present

## 2018-10-14 DIAGNOSIS — Z3A01 Less than 8 weeks gestation of pregnancy: Secondary | ICD-10-CM | POA: Diagnosis not present

## 2018-10-14 DIAGNOSIS — N189 Chronic kidney disease, unspecified: Secondary | ICD-10-CM | POA: Diagnosis not present

## 2018-10-14 DIAGNOSIS — F319 Bipolar disorder, unspecified: Secondary | ICD-10-CM | POA: Diagnosis not present

## 2018-10-14 DIAGNOSIS — O99351 Diseases of the nervous system complicating pregnancy, first trimester: Secondary | ICD-10-CM | POA: Insufficient documentation

## 2018-10-14 DIAGNOSIS — O26891 Other specified pregnancy related conditions, first trimester: Secondary | ICD-10-CM | POA: Insufficient documentation

## 2018-10-14 DIAGNOSIS — O208 Other hemorrhage in early pregnancy: Secondary | ICD-10-CM | POA: Diagnosis not present

## 2018-10-14 DIAGNOSIS — Z885 Allergy status to narcotic agent status: Secondary | ICD-10-CM | POA: Diagnosis not present

## 2018-10-14 DIAGNOSIS — J45909 Unspecified asthma, uncomplicated: Secondary | ICD-10-CM | POA: Insufficient documentation

## 2018-10-14 DIAGNOSIS — Z79899 Other long term (current) drug therapy: Secondary | ICD-10-CM | POA: Insufficient documentation

## 2018-10-14 DIAGNOSIS — O99511 Diseases of the respiratory system complicating pregnancy, first trimester: Secondary | ICD-10-CM | POA: Insufficient documentation

## 2018-10-14 DIAGNOSIS — O418X90 Other specified disorders of amniotic fluid and membranes, unspecified trimester, not applicable or unspecified: Secondary | ICD-10-CM | POA: Diagnosis present

## 2018-10-14 DIAGNOSIS — O26899 Other specified pregnancy related conditions, unspecified trimester: Secondary | ICD-10-CM | POA: Diagnosis present

## 2018-10-14 DIAGNOSIS — Z881 Allergy status to other antibiotic agents status: Secondary | ICD-10-CM | POA: Insufficient documentation

## 2018-10-14 DIAGNOSIS — Z3490 Encounter for supervision of normal pregnancy, unspecified, unspecified trimester: Secondary | ICD-10-CM

## 2018-10-14 DIAGNOSIS — O468X9 Other antepartum hemorrhage, unspecified trimester: Secondary | ICD-10-CM

## 2018-10-14 DIAGNOSIS — O26831 Pregnancy related renal disease, first trimester: Secondary | ICD-10-CM | POA: Diagnosis not present

## 2018-10-14 DIAGNOSIS — R109 Unspecified abdominal pain: Secondary | ICD-10-CM | POA: Diagnosis not present

## 2018-10-14 DIAGNOSIS — O468X1 Other antepartum hemorrhage, first trimester: Secondary | ICD-10-CM

## 2018-10-14 DIAGNOSIS — O418X10 Other specified disorders of amniotic fluid and membranes, first trimester, not applicable or unspecified: Secondary | ICD-10-CM

## 2018-10-14 DIAGNOSIS — Z880 Allergy status to penicillin: Secondary | ICD-10-CM | POA: Insufficient documentation

## 2018-10-14 DIAGNOSIS — M419 Scoliosis, unspecified: Secondary | ICD-10-CM | POA: Diagnosis not present

## 2018-10-14 DIAGNOSIS — Z882 Allergy status to sulfonamides status: Secondary | ICD-10-CM | POA: Diagnosis not present

## 2018-10-14 DIAGNOSIS — F419 Anxiety disorder, unspecified: Secondary | ICD-10-CM | POA: Insufficient documentation

## 2018-10-14 HISTORY — DX: Chronic kidney disease, unspecified: N18.9

## 2018-10-14 HISTORY — DX: Tubulo-interstitial nephritis, not specified as acute or chronic: N12

## 2018-10-14 HISTORY — DX: Unspecified ovarian cyst, unspecified side: N83.209

## 2018-10-14 HISTORY — DX: Interstitial cystitis (chronic) without hematuria: N30.10

## 2018-10-14 HISTORY — DX: Endometriosis, unspecified: N80.9

## 2018-10-14 HISTORY — DX: Unspecified infectious disease: B99.9

## 2018-10-14 LAB — URINALYSIS, ROUTINE W REFLEX MICROSCOPIC
BILIRUBIN URINE: NEGATIVE
Glucose, UA: NEGATIVE mg/dL
HGB URINE DIPSTICK: NEGATIVE
KETONES UR: 5 mg/dL — AB
Leukocytes, UA: NEGATIVE
NITRITE: NEGATIVE
PROTEIN: NEGATIVE mg/dL
Specific Gravity, Urine: 1.012 (ref 1.005–1.030)
pH: 6 (ref 5.0–8.0)

## 2018-10-14 LAB — CBC
HCT: 39 % (ref 36.0–46.0)
HEMOGLOBIN: 13.5 g/dL (ref 12.0–15.0)
MCH: 31.2 pg (ref 26.0–34.0)
MCHC: 34.6 g/dL (ref 30.0–36.0)
MCV: 90.1 fL (ref 80.0–100.0)
PLATELETS: 269 10*3/uL (ref 150–400)
RBC: 4.33 MIL/uL (ref 3.87–5.11)
RDW: 12.2 % (ref 11.5–15.5)
WBC: 7.5 10*3/uL (ref 4.0–10.5)
nRBC: 0 % (ref 0.0–0.2)

## 2018-10-14 LAB — WET PREP, GENITAL
CLUE CELLS WET PREP: NONE SEEN
Sperm: NONE SEEN
Trich, Wet Prep: NONE SEEN
Yeast Wet Prep HPF POC: NONE SEEN

## 2018-10-14 LAB — HCG, QUANTITATIVE, PREGNANCY: HCG, BETA CHAIN, QUANT, S: 69638 m[IU]/mL — AB (ref <5)

## 2018-10-14 LAB — ABO/RH: ABO/RH(D): O POS

## 2018-10-14 LAB — POCT PREGNANCY, URINE: PREG TEST UR: POSITIVE — AB
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
## 2018-10-14 NOTE — Telephone Encounter (Signed)
I agree with your recommendation to go to MAU.

## 2018-10-14 NOTE — Telephone Encounter (Signed)
Spoke with patient. Advised she will go to [facility] MAU for evaluation, the provider will determine what testing is needed after evaluation. Questions answered.   Routing to provider for final review. Patient is agreeable to disposition. Will close encounter.

## 2018-10-14 NOTE — MAU Note (Signed)
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  Is almost 7 wks.  Doesn't have insurance.  Called dr's office and was told to come here. Has had mild cramps, is not concerned about that, but was told she is high risk. No bleeding. Hx of endometriosis and a tilted uterus.

## 2018-10-14 NOTE — Telephone Encounter (Signed)
Spoke with patient. LMP 08/27/18. Positive UPT 10/11/18. Diarrhea x2 this morning with nausea. Reports intermittent abdominal/pelvic cramping since missed menses 08/2018, resolves with resting. Work 40hrs, taking several breaks. Denies pain currently. Denies fever/chills. Denies vaginal bleeding. Reports intermittent, scant amt of clear vaginal d/c, no odor.   Patient reports Hx of "3 confirmed miscarriages". Hx of chlamydia.   No current health insurance.   Instructed patient to go directly to [facility] MAU for further evaluation. Will review with Dr. Quincy Simmonds and return call with any additional recommendations. Patient is agreeable.   Routing to Dr. Quincy Simmonds.

## 2018-10-14 NOTE — Telephone Encounter (Signed)
Call returned to patient. Advised no additional recommendations, go to MAU for further evaluation as previously advised. Patient verbalizes understanding and is agreeable. Encounter closed.

## 2018-10-14 NOTE — Telephone Encounter (Signed)
Patient left voicemail over lunch stating that she needed to know the types of testing that she needed to get done at MAU. See prior phone note.

## 2018-10-14 NOTE — Discharge Instructions (Signed)
[facility] [place] for [place] @ [facility]   Phone: [place] for [place] @ [place]   Phone: [place] @[place]       Phone: [place] for [place] @ [place]     Phone: [place] for [place] @ [place]   Phone: [place] for [place] @ [facility]  Phone: [place])  Phone: [phone]   Safe Medications in Pregnancy   Acne: Benzoyl Peroxide Salicylic Acid  Backache/Headache: Tylenol: 2 regular strength every 4 hours OR              2 Extra strength every 6 hours  Colds/Coughs/Allergies: Benadryl (alcohol free) 25 mg every 6 hours as needed Breath right strips Claritin Cepacol throat lozenges Chloraseptic throat spray Cold-Eeze- up to three times per day Cough drops, alcohol free Flonase (by prescription only) Guaifenesin Mucinex Robitussin DM (plain only, alcohol free) Saline nasal spray/drops Sudafed (pseudoephedrine) & Actifed ** use only after [redacted] weeks gestation and if you do not have high blood pressure Tylenol Vicks Vaporub Zinc lozenges Zyrtec   Constipation: Colace Ducolax suppositories Fleet enema Glycerin suppositories Metamucil Milk of magnesia Miralax Senokot Smooth move tea  Diarrhea: Kaopectate Imodium A-D  *NO pepto Bismol  Hemorrhoids: Anusol Anusol HC Preparation H Tucks  Indigestion: Tums Maalox Mylanta Zantac  Pepcid  Insomnia: Benadryl (alcohol free) 25mg  every 6 hours as needed Tylenol PM Unisom, no Gelcaps  Leg Cramps: Tums MagGel  Nausea/Vomiting:  Bonine Dramamine Emetrol Ginger extract Sea bands Meclizine  Nausea medication to take during pregnancy:  Unisom (doxylamine succinate 25 mg tablets) Take one tablet daily at bedtime. If symptoms are not adequately controlled, the  dose can be increased to a maximum recommended dose of two tablets daily (1/2 tablet in the morning, 1/2 tablet mid-afternoon and one at bedtime). Vitamin B6 100mg  tablets. Take one tablet twice a day (up to 200 mg per day).  Skin Rashes: Aveeno products Benadryl cream or 25mg  every 6 hours as needed Calamine Lotion 1% cortisone cream  Yeast infection: Gyne-lotrimin 7 Monistat 7   **If taking multiple medications, please check labels to avoid duplicating the same active ingredients **take medication as directed on the label ** Do not exceed 4000 mg of tylenol in 24 hours **Do not take medications that contain aspirin or ibuprofen

## 2018-10-14 NOTE — Telephone Encounter (Signed)
Patient called requesting an appointment with Dr. Quincy Simmonds to confirm pregnancy. She has taken a positive urine pregnancy test, LMP: 08/27/18, and stated she is experiencing some cramping.  Last seen: 05/13/18

## 2018-10-15 LAB — GC/CHLAMYDIA PROBE AMP (~~LOC~~) NOT AT ARMC
Chlamydia: NEGATIVE
Neisseria Gonorrhea: NEGATIVE

## 2018-10-15 LAB — HIV ANTIBODY (ROUTINE TESTING W REFLEX): HIV Screen 4th Generation wRfx: NONREACTIVE

## 2018-10-21 ENCOUNTER — Telehealth: Payer: Self-pay | Admitting: Obstetrics and Gynecology

## 2018-10-21 NOTE — Telephone Encounter (Signed)
Call to patient. Patient states she was given a list of locations that accept Medicaid insurance during pregnancy. Patient states she went ahead and scheduled with [facility] [place] [facility], but wanted to know if Dr. Quincy Simmonds recommended them? Patient states she is scheduled with Manya Silvas on 11-19-18. Other offices on the list: [facility] [place]: [place], [place], [place], [facility], [place] and [facility] in [place]. Patient states any recommendations from Dr. Quincy Simmonds are greatly appreciated. RN advised would review with Dr. Quincy Simmonds and return call. Patient agreeable.   Routing to provider for review.

## 2018-10-21 NOTE — Telephone Encounter (Signed)
Patient is wanting recommendations from Dr. Quincy Simmonds for OBs who take Medicaid. Patient was given a list of providers at MAU who take her insurance and would like to know which one(s) Dr. Quincy Simmonds would recommend because she values Dr. Elza Rafter opinion greatly.

## 2018-10-23 NOTE — Telephone Encounter (Signed)
Many of the providers see patients at more than one location with the group [facility].  She should be fine at any location.  I would choose what is more convenient for her as she will be having regular visits.

## 2018-10-23 NOTE — Telephone Encounter (Signed)
Call to patient. Message given to patient as seen below from Dr. Quincy Simmonds and patient verbalized understanding. Patient appreciative of phone call and Dr. Elza Rafter recommendations.   Encounter closed.

## 2018-11-15 ENCOUNTER — Other Ambulatory Visit (HOSPITAL_COMMUNITY)
Admission: RE | Admit: 2018-11-15 | Discharge: 2018-11-15 | Disposition: A | Discharge status: Home / Self Care | Payer: Medicaid Other | Source: Ambulatory Visit | Attending: Family Medicine | Admitting: Family Medicine

## 2018-11-15 ENCOUNTER — Ambulatory Visit (INDEPENDENT_AMBULATORY_CARE_PROVIDER_SITE_OTHER): Payer: Medicaid Other | Admitting: General Practice

## 2018-11-15 DIAGNOSIS — Z23 Encounter for immunization: Secondary | ICD-10-CM | POA: Diagnosis not present

## 2018-11-15 DIAGNOSIS — Z3400 Encounter for supervision of normal first pregnancy, unspecified trimester: Secondary | ICD-10-CM | POA: Insufficient documentation

## 2018-11-15 DIAGNOSIS — Z3401 Encounter for supervision of normal first pregnancy, first trimester: Secondary | ICD-10-CM

## 2018-11-15 DIAGNOSIS — O09899 Supervision of other high risk pregnancies, unspecified trimester: Secondary | ICD-10-CM

## 2018-11-15 LAB — POCT URINALYSIS DIP (DEVICE)
Glucose, UA: NEGATIVE mg/dL
HGB URINE DIPSTICK: NEGATIVE
Ketones, ur: 80 mg/dL — AB
LEUKOCYTES UA: NEGATIVE
Nitrite: NEGATIVE
Protein, ur: NEGATIVE mg/dL
SPECIFIC GRAVITY, URINE: 1.025 (ref 1.005–1.030)
Urobilinogen, UA: 0.2 mg/dL (ref 0.0–1.0)
pH: 5.5 (ref 5.0–8.0)

## 2018-11-15 NOTE — Progress Notes (Signed)
Chart reviewed - agree with RN documentation.   

## 2018-11-15 NOTE — Progress Notes (Signed)
Patient presents to office today for new OB nurse intake. OB/Medical History reviewed with patient. New OB labs drawn. Patient will return to office on 12/30 for New OB appt with Dr Nehemiah Settle.  Koren Bound RN BSN 11/15/18

## 2018-11-17 LAB — CULTURE, OB URINE

## 2018-11-17 LAB — URINE CULTURE, OB REFLEX

## 2018-11-18 LAB — GC/CHLAMYDIA PROBE AMP (~~LOC~~) NOT AT ARMC
CHLAMYDIA, DNA PROBE: NEGATIVE
Neisseria Gonorrhea: NEGATIVE

## 2018-11-19 ENCOUNTER — Encounter: Payer: Self-pay | Admitting: Advanced Practice Midwife

## 2018-11-24 ENCOUNTER — Encounter (HOSPITAL_COMMUNITY): Payer: Self-pay | Admitting: *Deleted

## 2018-11-24 ENCOUNTER — Inpatient Hospital Stay (HOSPITAL_COMMUNITY)
Admission: AD | Admit: 2018-11-24 | Discharge: 2018-11-24 | Disposition: A | Discharge status: Home / Self Care | Payer: Medicaid Other | Source: Ambulatory Visit | Attending: Obstetrics & Gynecology | Admitting: Obstetrics & Gynecology

## 2018-11-24 DIAGNOSIS — Z818 Family history of other mental and behavioral disorders: Secondary | ICD-10-CM | POA: Diagnosis not present

## 2018-11-24 DIAGNOSIS — J45909 Unspecified asthma, uncomplicated: Secondary | ICD-10-CM | POA: Diagnosis not present

## 2018-11-24 DIAGNOSIS — O09899 Supervision of other high risk pregnancies, unspecified trimester: Secondary | ICD-10-CM

## 2018-11-24 DIAGNOSIS — O98811 Other maternal infectious and parasitic diseases complicating pregnancy, first trimester: Secondary | ICD-10-CM | POA: Diagnosis not present

## 2018-11-24 DIAGNOSIS — Z885 Allergy status to narcotic agent status: Secondary | ICD-10-CM | POA: Diagnosis not present

## 2018-11-24 DIAGNOSIS — O26891 Other specified pregnancy related conditions, first trimester: Secondary | ICD-10-CM | POA: Diagnosis not present

## 2018-11-24 DIAGNOSIS — Z91018 Allergy to other foods: Secondary | ICD-10-CM | POA: Insufficient documentation

## 2018-11-24 DIAGNOSIS — F319 Bipolar disorder, unspecified: Secondary | ICD-10-CM | POA: Diagnosis not present

## 2018-11-24 DIAGNOSIS — Z3A12 12 weeks gestation of pregnancy: Secondary | ICD-10-CM | POA: Diagnosis not present

## 2018-11-24 DIAGNOSIS — Z881 Allergy status to other antibiotic agents status: Secondary | ICD-10-CM | POA: Insufficient documentation

## 2018-11-24 DIAGNOSIS — R109 Unspecified abdominal pain: Secondary | ICD-10-CM | POA: Diagnosis not present

## 2018-11-24 DIAGNOSIS — Z833 Family history of diabetes mellitus: Secondary | ICD-10-CM | POA: Diagnosis not present

## 2018-11-24 DIAGNOSIS — N189 Chronic kidney disease, unspecified: Secondary | ICD-10-CM | POA: Insufficient documentation

## 2018-11-24 DIAGNOSIS — Z880 Allergy status to penicillin: Secondary | ICD-10-CM | POA: Diagnosis not present

## 2018-11-24 DIAGNOSIS — O26831 Pregnancy related renal disease, first trimester: Secondary | ICD-10-CM | POA: Diagnosis not present

## 2018-11-24 DIAGNOSIS — Z79899 Other long term (current) drug therapy: Secondary | ICD-10-CM | POA: Diagnosis not present

## 2018-11-24 DIAGNOSIS — O98819 Other maternal infectious and parasitic diseases complicating pregnancy, unspecified trimester: Secondary | ICD-10-CM | POA: Diagnosis present

## 2018-11-24 DIAGNOSIS — F419 Anxiety disorder, unspecified: Secondary | ICD-10-CM | POA: Diagnosis not present

## 2018-11-24 DIAGNOSIS — O99511 Diseases of the respiratory system complicating pregnancy, first trimester: Secondary | ICD-10-CM | POA: Insufficient documentation

## 2018-11-24 DIAGNOSIS — O99341 Other mental disorders complicating pregnancy, first trimester: Secondary | ICD-10-CM | POA: Diagnosis not present

## 2018-11-24 DIAGNOSIS — Z91040 Latex allergy status: Secondary | ICD-10-CM | POA: Diagnosis not present

## 2018-11-24 DIAGNOSIS — O26899 Other specified pregnancy related conditions, unspecified trimester: Secondary | ICD-10-CM

## 2018-11-24 DIAGNOSIS — B373 Candidiasis of vulva and vagina: Secondary | ICD-10-CM | POA: Insufficient documentation

## 2018-11-24 DIAGNOSIS — O99011 Anemia complicating pregnancy, first trimester: Secondary | ICD-10-CM | POA: Diagnosis not present

## 2018-11-24 DIAGNOSIS — B3731 Acute candidiasis of vulva and vagina: Secondary | ICD-10-CM

## 2018-11-24 LAB — URINALYSIS, ROUTINE W REFLEX MICROSCOPIC
Bilirubin Urine: NEGATIVE
Glucose, UA: NEGATIVE mg/dL
Hgb urine dipstick: NEGATIVE
KETONES UR: NEGATIVE mg/dL
LEUKOCYTES UA: NEGATIVE
NITRITE: NEGATIVE
PH: 6 (ref 5.0–8.0)
Protein, ur: NEGATIVE mg/dL
Specific Gravity, Urine: 1.013 (ref 1.005–1.030)

## 2018-11-24 LAB — WET PREP, GENITAL
CLUE CELLS WET PREP: NONE SEEN
Sperm: NONE SEEN
TRICH WET PREP: NONE SEEN

## 2018-11-24 MED ORDER — TERCONAZOLE 0.4 % VA CREA: 1.0000 | TOPICAL_CREAM | Freq: Every day | VAGINAL | 0 refills | Status: DC
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
## 2018-11-24 NOTE — MAU Note (Signed)
Woke up this AM and noticed liquid dripping down her leg around 0915, was clear, no odor. Felt another LOF later at 23.  No bleeding, no pain

## 2018-11-24 NOTE — Discharge Instructions (Signed)
Vaginal Yeast infection, Adult    Vaginal yeast infection is a condition that causes vaginal discharge as well as soreness, swelling, and redness (inflammation) of the vagina. This is a common condition. Some women get this infection frequently.  What are the causes?  This condition is caused by a change in the normal balance of the yeast (candida) and bacteria that live in the vagina. This change causes an overgrowth of yeast, which causes the inflammation.  What increases the risk?  The condition is more likely to develop in women who:   Take antibiotic medicines.   Have diabetes.   Take birth control pills.   Are pregnant.   Douche often.   Have a weak body defense system (immune system).   Have been taking steroid medicines for a long time.   Frequently wear tight clothing.  What are the signs or symptoms?  Symptoms of this condition include:   White, thick, creamy vaginal discharge.   Swelling, itching, redness, and irritation of the vagina. The lips of the vagina (vulva) may be affected as well.   Pain or a burning feeling while urinating.   Pain during sex.  How is this diagnosed?  This condition is diagnosed based on:   Your medical history.   A physical exam.   A pelvic exam. Your health care provider will examine a sample of your vaginal discharge under a microscope. Your health care provider may send this sample for testing to confirm the diagnosis.  How is this treated?  This condition is treated with medicine. Medicines may be over-the-counter or prescription. You may be told to use one or more of the following:   Medicine that is taken by mouth (orally).   Medicine that is applied as a cream (topically).   Medicine that is inserted directly into the vagina (suppository).  Follow these instructions at home:    Lifestyle   Do not have sex until your health care provider approves. Tell your sex partner that you have a yeast infection. That person should go to his or her health care  provider and ask if they should also be treated.   Do not wear tight clothes, such as pantyhose or tight pants.   Wear breathable cotton underwear.  General instructions   Take or apply over-the-counter and prescription medicines only as told by your health care provider.   Eat more yogurt. This may help to keep your yeast infection from returning.   Do not use tampons until your health care provider approves.   Try taking a sitz bath to help with discomfort. This is a warm water bath that is taken while you are sitting down. The water should only come up to your hips and should cover your buttocks. Do this 3-4 times per day or as told by your health care provider.   Do not douche.   If you have diabetes, keep your blood sugar levels under control.   Keep all follow-up visits as told by your health care provider. This is important.  Contact a health care provider if:   You have a fever.   Your symptoms go away and then return.   Your symptoms do not get better with treatment.   Your symptoms get worse.   You have new symptoms.   You develop blisters in or around your vagina.   You have blood coming from your vagina and it is not your menstrual period.   You develop pain in your abdomen.  Summary     Vaginal yeast infection is a condition that causes discharge as well as soreness, swelling, and redness (inflammation) of the vagina.   This condition is treated with medicine. Medicines may be over-the-counter or prescription.   Take or apply over-the-counter and prescription medicines only as told by your health care provider.   Do not douche. Do not have sex or use tampons until your health care provider approves.   Contact a health care provider if your symptoms do not get better with treatment or your symptoms go away and then return.  This information is not intended to replace advice given to you by your health care provider. Make sure you discuss any questions you have with your health care  provider.  Document Released: 08/30/2005 Document Revised: 04/08/2018 Document Reviewed: 04/08/2018  Elsevier Interactive Patient Education  2019 [employer]vier Inc[employer]

## 2018-11-26 LAB — INHERITEST(R) CF/SMA PANEL

## 2018-11-26 LAB — OBSTETRIC PANEL, INCLUDING HIV
Antibody Screen: NEGATIVE
BASOS ABS: 0 10*3/uL (ref 0.0–0.2)
Basos: 0 %
EOS (ABSOLUTE): 0.1 10*3/uL (ref 0.0–0.4)
EOS: 1 %
HIV SCREEN 4TH GENERATION: NONREACTIVE
Hematocrit: 36.7 % (ref 34.0–46.6)
Hemoglobin: 12.9 g/dL (ref 11.1–15.9)
Hepatitis B Surface Ag: NEGATIVE
IMMATURE GRANULOCYTES: 0 %
Immature Grans (Abs): 0 10*3/uL (ref 0.0–0.1)
LYMPHS ABS: 1.8 10*3/uL (ref 0.7–3.1)
Lymphs: 25 %
MCH: 31.6 pg (ref 26.6–33.0)
MCHC: 35.1 g/dL (ref 31.5–35.7)
MCV: 90 fL (ref 79–97)
MONOS ABS: 0.6 10*3/uL (ref 0.1–0.9)
Monocytes: 8 %
NEUTROS ABS: 4.8 10*3/uL (ref 1.4–7.0)
Neutrophils: 66 %
PLATELETS: 256 10*3/uL (ref 150–450)
RBC: 4.08 x10E6/uL (ref 3.77–5.28)
RDW: 12.5 % (ref 12.3–15.4)
RH TYPE: POSITIVE
RPR Ser Ql: NONREACTIVE
Rubella Antibodies, IGG: 4.18 index (ref >0.99)
WBC: 7.4 10*3/uL (ref 3.4–10.8)

## 2018-11-26 LAB — HEMOGLOBINOPATHY EVALUATION
Ferritin: 51 ng/mL (ref 15–150)
HGB A2 QUANT: 2.2 % (ref 1.8–3.2)
HGB A: 97.8 % (ref 96.4–98.8)
HGB S: 0 %
Hgb C: 0 %
Hgb F Quant: 0 % (ref 0.0–2.0)
Hgb Solubility: NEGATIVE
Hgb Variant: 0 %

## 2018-11-28 ENCOUNTER — Encounter: Payer: Self-pay | Admitting: Student

## 2018-12-02 ENCOUNTER — Encounter: Payer: Self-pay | Admitting: Family Medicine

## 2018-12-02 ENCOUNTER — Encounter: Payer: Self-pay | Admitting: *Deleted

## 2018-12-02 ENCOUNTER — Ambulatory Visit (INDEPENDENT_AMBULATORY_CARE_PROVIDER_SITE_OTHER): Payer: Medicaid Other | Admitting: Family Medicine

## 2018-12-02 VITALS — BP 119/72 | HR 88 | Wt 130.4 lb

## 2018-12-02 DIAGNOSIS — O418X10 Other specified disorders of amniotic fluid and membranes, first trimester, not applicable or unspecified: Secondary | ICD-10-CM

## 2018-12-02 DIAGNOSIS — R87619 Unspecified abnormal cytological findings in specimens from cervix uteri: Secondary | ICD-10-CM

## 2018-12-02 DIAGNOSIS — O468X1 Other antepartum hemorrhage, first trimester: Secondary | ICD-10-CM

## 2018-12-02 DIAGNOSIS — Z3402 Encounter for supervision of normal first pregnancy, second trimester: Secondary | ICD-10-CM

## 2018-12-02 DIAGNOSIS — O09899 Supervision of other high risk pregnancies, unspecified trimester: Secondary | ICD-10-CM

## 2018-12-02 HISTORY — DX: Unspecified abnormal cytological findings in specimens from cervix uteri: R87.619

## 2018-12-02 NOTE — Progress Notes (Signed)
PMH completed.
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
## 2018-12-03 ENCOUNTER — Encounter: Payer: Self-pay | Admitting: *Deleted

## 2018-12-04 DIAGNOSIS — Z1589 Genetic susceptibility to other disease: Secondary | ICD-10-CM

## 2018-12-04 DIAGNOSIS — E7212 Methylenetetrahydrofolate reductase deficiency: Secondary | ICD-10-CM

## 2018-12-04 HISTORY — DX: Methylenetetrahydrofolate reductase deficiency: E72.12

## 2018-12-04 HISTORY — DX: Genetic susceptibility to other disease: Z15.89

## 2018-12-04 NOTE — L&D Delivery Note (Signed)
OB/GYN Faculty Practice Delivery Note  Whitney Gonzalez is a 28 y.o. G4P0030 s/p induced vaginal at [redacted]w[redacted]d. She was admitted for IOL gHTN.   GBS Status: Negative (06/04 0000) Maximum Maternal Temperature: Temp (24hrs), Avg:98.4 F (36.9 C), Min:97.1 F (36.2 C), Max:99.1 F (37.3 C)   Labor Progress: . Foley balloon placed as outpatient and admitted with painful contractions.  . Started on buccal cytotec and then transitioned to vaginal cytotec . AROM 15h 47m prior to delivery with clear fluid  . Pitocin until complete dilation achieved.   Delivery Date/Time: 05/14/2019 at 0228   Delivery: Called to room and patient was complete and pushing. Head delivered OA followed by rotation to ROA. No nuchal cord present. Shoulder and body delivered in usual fashion. Infant with spontaneous cry, placed on mother's abdomen, dried and stimulated. Cord clamped x 2 after 1-minute delay, and cut by FOB. Cord blood drawn. Placenta delivered spontaneously with gentle cord traction. Fundus firm with massage and Pitocin. Labia, perineum, vagina, and cervix inspected with bilateraly lateral distal vaginal wall 1st degree tears and 2nd degree perineal tear. Perineal tear was repaired with 3.0 vicryl. No lidocaine was needed for repair.   Placenta: spontaneous , intact  Complications: hypertension Lacerations: 1st degree 2nd degree vaginal perineal, 3.0 vicryl EBL: 200 mL - see delivery summary for QBL per [place]  Analgesia: Epidural anesthesia  Postpartum Planning . Lactation consult, breast feeding . AM CBC . Contraception - POPs  . Circ - yes, inpatient     Infant: Viable female  APGARs 7/8  3062g  Zettie Cooley, M.D.  Family Medicine  PGY-1 05/14/2019 3:04 AM  Attestation: I agree with the resident's documentation. I was not present for delivery of fetus but was present for delivery of placenta and assisted with repair.   Lambert Mody. Juleen China, DO OB/GYN Fellow

## 2018-12-23 ENCOUNTER — Ambulatory Visit (INDEPENDENT_AMBULATORY_CARE_PROVIDER_SITE_OTHER): Payer: Medicaid Other | Admitting: Family Medicine

## 2018-12-23 ENCOUNTER — Encounter: Payer: Self-pay | Admitting: Family Medicine

## 2018-12-23 ENCOUNTER — Telehealth: Payer: Self-pay | Admitting: *Deleted

## 2018-12-23 VITALS — BP 129/82 | HR 83 | Wt 133.9 lb

## 2018-12-23 DIAGNOSIS — F319 Bipolar disorder, unspecified: Secondary | ICD-10-CM

## 2018-12-23 DIAGNOSIS — Z3402 Encounter for supervision of normal first pregnancy, second trimester: Secondary | ICD-10-CM

## 2018-12-23 DIAGNOSIS — Z3A16 16 weeks gestation of pregnancy: Secondary | ICD-10-CM

## 2018-12-23 DIAGNOSIS — M2560 Stiffness of unspecified joint, not elsewhere classified: Secondary | ICD-10-CM

## 2018-12-23 LAB — POCT URINALYSIS DIP (DEVICE)
Bilirubin Urine: NEGATIVE
GLUCOSE, UA: NEGATIVE mg/dL
Hgb urine dipstick: NEGATIVE
KETONES UR: NEGATIVE mg/dL
Leukocytes, UA: NEGATIVE
NITRITE: NEGATIVE
PROTEIN: NEGATIVE mg/dL
Specific Gravity, Urine: 1.01 (ref 1.005–1.030)
UROBILINOGEN UA: 0.2 mg/dL (ref 0.0–1.0)
pH: 7 (ref 5.0–8.0)

## 2018-12-23 NOTE — Telephone Encounter (Signed)
I called Whitney Gonzalez and she states she called back and they gave her appt and she is here now waiting to see a doctor.

## 2018-12-23 NOTE — Progress Notes (Signed)
   PRENATAL VISIT NOTE  Subjective:  Whitney Gonzalez is a 28 y.o. G4P0030 at [redacted]w[redacted]d being seen today for ongoing prenatal care.  She is currently monitored for the following issues for this high-risk pregnancy and has Bipolar I disorder ([place]); Dysuria; Subchorionic hematoma; Abdominal pain affecting pregnancy; Supervision of low-risk first pregnancy; History of maternal syphilis, currently pregnant; and Abnormal Pap smear of cervix on their problem list.  Patient reports joint stiffness and swelling of her hands in the morning. Also c/o cloudy urine in the morning. denies dysuria.  Contractions: Not present. Vag. Bleeding: None.  Movement: Present. Denies leaking of fluid.   The following portions of the patient's history were reviewed and updated as appropriate: allergies, current medications, past family history, past medical history, past social history, past surgical history and problem list. Problem list updated.  Objective:   Vitals:   12/23/18 1036  BP: 129/82  Pulse: 83  Weight: 133 lb 14.4 oz (60.7 kg)    Fetal Status: Fetal Heart Rate (bpm): 150   Movement: Present     General:  Alert, oriented and cooperative. Patient is in no acute distress.  Skin: Skin is warm and dry. No rash noted.   Cardiovascular: Normal heart rate noted  Respiratory: Normal respiratory effort, no problems with respiration noted  Abdomen: Soft, gravid, appropriate for gestational age.  Pain/Pressure: Present     Pelvic: Cervical exam deferred        Extremities: Normal range of motion.  Edema: Trace  Mental Status: Normal mood and affect. Normal behavior. Normal judgment and thought content.   Assessment and Plan:  Pregnancy: [id number] at [redacted]w[redacted]d  1. Encounter for supervision of low-risk first pregnancy in second trimester AFP today Anatomy u/s scheduled - AFP, Serum, Open Spina Bifida - [place] MFM OB COMP + 14 WK; Future  2. Bipolar I disorder ([place]) Declines medications Contracts for safety if  mood becomes unstable  3. Joint stiffness R/o autoimmune process - Rheumatoid Factor - Antinuclear Antib (ANA)  General obstetric precautions including but not limited to vaginal bleeding, contractions, leaking of fluid and fetal movement were reviewed in detail with the patient. Please refer to After Visit Summary for other counseling recommendations.  Return in 4 weeks (on 01/20/2019).  Future Appointments  Date Time Provider [place]  01/02/2019  2:35 PM Rasch, Artist Pais, NP [place]  01/07/2019  2:00 PM [facility]-MFC [place] 3 WH-MFCUS MFC-US    Donnamae Jude, MD

## 2018-12-23 NOTE — Patient Instructions (Addendum)

## 2018-12-23 NOTE — Progress Notes (Signed)
Anatomy ultrasound scheduled for February 4th at 1400.

## 2018-12-23 NOTE — Telephone Encounter (Signed)
Whitney Gonzalez called and left a voice message this am stating she is about 17 weeks and has appointment 01/02/19 but has some concerns.  States she has noticed her urine is cloudy last 2 days and also that her fingers are swelling.  States her Mom had her take her blood pressure and it was 127/80 which is a little high for her. States she is concerned about preeclampsia or that she is dehydrated ; but states she is good about fluids. States working [employer] 

## 2018-12-24 LAB — AFP, SERUM, OPEN SPINA BIFIDA
AFP MOM: 0.78
AFP VALUE AFPOSL: 30.2 ng/mL
Gest. Age on Collection Date: 16.6 weeks
MATERNAL AGE AT EDD: 26.9 a
OSBR Risk 1 IN: 10000
Test Results:: NEGATIVE
WEIGHT: 133 [lb_av]

## 2018-12-24 LAB — RHEUMATOID FACTOR

## 2018-12-24 LAB — ANA: ANA: NEGATIVE

## 2018-12-25 ENCOUNTER — Telehealth: Payer: Self-pay

## 2018-12-25 ENCOUNTER — Ambulatory Visit (INDEPENDENT_AMBULATORY_CARE_PROVIDER_SITE_OTHER): Payer: Medicaid Other | Admitting: Internal Medicine

## 2018-12-25 VITALS — BP 112/72 | HR 87 | Wt 135.0 lb

## 2018-12-25 DIAGNOSIS — N644 Mastodynia: Secondary | ICD-10-CM

## 2018-12-25 NOTE — Patient Instructions (Signed)
Your breast pain is likely related to increased tissue density from hormonal changes in pregnancy and it is also possible that you have pulled a muscle in that area. I have ordered an ultrasound just to be sure we are not missing anything. I see no signs of infection of the tissue of the breast. I would recommend heat pack to the area. Muscles rubs and tylenol are also very safe in pregnancy.

## 2018-12-25 NOTE — Telephone Encounter (Signed)
Returned pt's call and discussed her concern. She stated she has had "breast lumps" in the past and had Mammogram which showed thickening of breast tissue. She wants to know if this could be the same problem. I stated that it could however I could not be sure without her having an exam. Pt was informed of walk-in clinic today and tomorrow in our office where the issue could be evaluated. Pt voiced understanding and will come to office today.

## 2018-12-25 NOTE — Telephone Encounter (Signed)
Patient called stating that she is [redacted] weeks pregnant and she started having pain in her right breast last night. She stated that its sore and that she feels lumps, she also stated that it hurts to lift more than 5 lbs and she was recommended to cal [place] for this issue. Patient would like a call back to see what to do.

## 2018-12-26 NOTE — Progress Notes (Signed)
   Subjective:    Whitney Gonzalez - 28 y.o. female MRN [id number]  Date of birth: 31-Jul-1991  HPI  Whitney Gonzalez is a 28 y.o. G59P0030 female here for concern for breast lump. Reports she has had breast pain on the outer portion of her right breast since last night and she noticed a lump in the upper, outer area of her breast. Feels like her breast feels warm but did not compare to the left breast. Noted some redness when she initially felt the bump but reports the redness resolved and may have been related to wearing a bra. She feels some straining on the outside of her breast and in her armpit when she lifts her arm overhead. Denies changes in physical activity. Denies nipple discharge, nipple bleeding, rashes of the breasts. Has had an ultrasound of breast in the past, no history of biopsy or breast cancer.     OB History    Gravida  4   Para  0   Term  0   Preterm  0   AB  3   Living  0     SAB  3   TAB  0   Ectopic  0   Multiple  0   Live Births  0           -  reports that she has never smoked. She has never used smokeless tobacco. - Review of Systems: Per HPI. - Past Medical History: Patient Active Problem List   Diagnosis Date Noted  . Abnormal Pap smear of cervix 12/02/2018  . Supervision of low-risk first pregnancy 11/15/2018  . History of maternal syphilis, currently pregnant 11/15/2018  . Subchorionic hematoma 10/14/2018  . Abdominal pain affecting pregnancy 10/14/2018  . Dysuria 08/10/2018  . Bipolar I disorder ([place]) 05/28/2012   - Medications: reviewed and updated   Objective:   Physical Exam BP 112/72   Pulse 87   Wt 135 lb (61.2 kg)   LMP 08/27/2018   BMI 26.37 kg/m  Gen: NAD, alert, cooperative with exam, well-appearing Breast: Breasts appear normal and symmetric without skin retraction, dimpling, rashes, or erythema. Nipples are flat bilaterally without drainage. Skin tissues is of equal warmth bilaterally. Dense knotty  fibrotic tissue is appreciated bilaterally throughout both breasts. No discrete masses palpated.   Assessment & Plan:   1. Breast pain, right Suspect discomfort related to fibroglandular tissue and hormonal changes with pregnancy. May also have a component of muscular strain. Will obtain ultrasound to ensure not missing anything on physical exam. Offered muscle relaxer but patient declined. Recommended supportive care options such as heating pads, tylenol, etc.  - US BREAST LTD UNI RIGHT INC AXILLA; Future   Routine preventative health maintenance measures emphasized. Please refer to After Visit Summary for other counseling recommendations.   No follow-ups on file.  Phill Myron, D.O. OB Fellow  12/26/2018, 2:16 PM

## 2018-12-31 ENCOUNTER — Encounter (HOSPITAL_COMMUNITY): Payer: Self-pay

## 2019-01-02 ENCOUNTER — Ambulatory Visit (INDEPENDENT_AMBULATORY_CARE_PROVIDER_SITE_OTHER): Payer: Medicaid Other | Admitting: Obstetrics and Gynecology

## 2019-01-02 VITALS — BP 118/76 | HR 82 | Wt 134.1 lb

## 2019-01-02 DIAGNOSIS — N949 Unspecified condition associated with female genital organs and menstrual cycle: Secondary | ICD-10-CM

## 2019-01-02 DIAGNOSIS — Z3402 Encounter for supervision of normal first pregnancy, second trimester: Secondary | ICD-10-CM

## 2019-01-02 LAB — POCT URINALYSIS DIP (DEVICE)
Bilirubin Urine: NEGATIVE
Glucose, UA: NEGATIVE mg/dL
Hgb urine dipstick: NEGATIVE
Ketones, ur: NEGATIVE mg/dL
Leukocytes, UA: NEGATIVE
Nitrite: NEGATIVE
Protein, ur: NEGATIVE mg/dL
Urobilinogen, UA: 0.2 mg/dL (ref 0.0–1.0)
pH: 6.5 (ref 5.0–8.0)

## 2019-01-02 MED ORDER — COMFORT FIT MATERNITY SUPP SM MISC: 1.0000 | Freq: Every day | 0 refills | Status: DC | PRN

## 2019-01-02 MED ORDER — TERCONAZOLE 0.4 % VA CREA: 1.0000 | TOPICAL_CREAM | Freq: Every day | VAGINAL | 0 refills | Status: DC

## 2019-01-02 NOTE — Progress Notes (Signed)
   PRENATAL VISIT NOTE  Subjective:  Whitney Gonzalez is a 28 y.o. G4P0030 at [redacted]w[redacted]d being seen today for ongoing prenatal care.  She is currently monitored for the following issues for this low-risk pregnancy and has Bipolar I disorder ([place]); Dysuria; Subchorionic hematoma; Abdominal pain affecting pregnancy; Supervision of low-risk first pregnancy; History of maternal syphilis, currently pregnant; Abnormal Pap smear of cervix; and Round ligament pain on their problem list.  Patient reports no complaints.  Contractions: Not present. Vag. Bleeding: None.  Movement: Present. Denies leaking of fluid.   The following portions of the patient's history were reviewed and updated as appropriate: allergies, current medications, past family history, past medical history, past social history, past surgical history and problem list. Problem list updated.  Objective:   Vitals:   01/02/19 1430  BP: 118/76  Pulse: 82  Weight: 134 lb 1.6 oz (60.8 kg)    Fetal Status: Fetal Heart Rate (bpm): 141   Movement: Present     General:  Alert, oriented and cooperative. Patient is in no acute distress.  Skin: Skin is warm and dry. No rash noted.   Cardiovascular: Normal heart rate noted  Respiratory: Normal respiratory effort, no problems with respiration noted  Abdomen: Soft, gravid, appropriate for gestational age.  Pain/Pressure: Present     Pelvic: Cervical exam deferred        Extremities: Normal range of motion.  Edema: Trace  Mental Status: Normal mood and affect. Normal behavior. Normal judgment and thought content.   Assessment and Plan:  Pregnancy: [id number] at [redacted]w[redacted]d  1. Encounter for supervision of low-risk first pregnancy in second trimester  - doing well - Having some round ligament pain; RX maternity belt. 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   There are no diagnoses linked to this encounter. Preterm labor symptoms and general obstetric precautions including but not limited to  vaginal bleeding, contractions, leaking of fluid and fetal movement were reviewed in detail with the patient. Please refer to After Visit Summary for other counseling recommendations.  Return in about 4 weeks (around 01/30/2019).  Future Appointments  Date Time Provider [place]  01/07/2019  2:00 PM [place] 3 [id number] M[facility]-US  01/30/2019 11:15 AM Isiac Breighner, Artist Pais, NP [facility]    Noni Saupe, NP

## 2019-01-02 NOTE — Patient Instructions (Addendum)
CIRCUMCISION  Circumcision is considered an elective/non-medically necessary procedure. There are many reasons parents decide to have their sons circumsized. During the first year of life circumcised males have a reduced risk of urinary tract infections but after this year the rates between circumcised males and uncircumcised males are the same.  It is safe to have your son circumcised outside of the hospital and the places above perform them regularly.    Places to have your son circumcised:    [facility] [phone] $480 by 4 wks  [facility] [phone] $244 by 4 wks  [facility] [phone] $175 by 2 wks  [facility] [phone] $250 by 7 days [facility] [phone] $150 by 4 wks  These prices sometimes change but are roughly what you can expect to pay. Please call and confirm pricing.     Childbirth Education Options: [facility] Department Classes:  Childbirth education classes can help you get ready for a positive parenting experience. You can also meet other expectant parents and get free stuff for your baby. Each class runs for five weeks on the same night and costs $45 for the mother-to-be and her support person. Medicaid covers the cost if you are eligible. Call [phone] to register. [facility] Childbirth Education:  [phone] or [phone] or [email or web address]  Baby & Me Class: Discuss newborn & infant parenting and family adjustment issues with other new mothers in a relaxed environment. Each week brings a new speaker or baby-centered activity. We encourage new mothers to join [place] every Thursday at 11:00am. Babies birth until crawling. No registration or fee. Daddy [employer]: This course offers Dads-to-be the tools and knowledge needed to feel confident on their journey to  becoming new fathers. Experienced dads, who have been trained as coaches, teach dads-to-be how to hold, comfort, diaper, swaddle and play with their infant while being able to support the new mom as well. A class for men taught by men. $25/dad Big Brother/Big Sister: Let your children share in the joy of a new brother or sister in this special class designed just for them. Class includes discussion about how families care for babies: swaddling, holding, diapering, safety as well as how they can be helpful in their new role. This class is designed for children ages 2 to 21, but any age is welcome. Please register each child individually. $5/child  Mom Talk: This mom-led group offers support and connection to mothers as they journey through the adjustments and struggles of that sometimes overwhelming first year after the birth of a child. Tuesdays at 10:00am and Thursdays at 6:00pm. Babies welcome. No registration or fee. Breastfeeding Support Group: This group is a mother-to-mother support circle where moms have the opportunity to share their breastfeeding experiences. A Lactation Consultant is present for questions and concerns. Meets each Tuesday at 11:00am. No fee or registration. Breastfeeding Your Baby: Learn what to expect in the first days of breastfeeding your newborn.  This class will help you feel more confident with the skills needed to begin your breastfeeding experience. Many new mothers are concerned about breastfeeding after leaving the hospital. This class will also address the most common fears and challenges about breastfeeding during the first few weeks, months and beyond. (call for fee) Comfort Techniques and Tour: This 2 hour interactive class will provide you the opportunity to learn & practice hands-on techniques that can help relieve some of the discomfort of labor and encourage your baby to rotate toward the best position for birth. You and your  partner will be able to try a variety of  labor positions with birth balls and rebozos as well as practice breathing, relaxation, and visualization techniques. A tour of the [facility] is included with this class. $20 per registrant and support person Childbirth Class- Weekend Option: This class is a Weekend version of our Birth & Baby series. It is designed for parents who have a difficult time fitting several weeks of classes into their schedule. It covers the care of your newborn and the basics of labor and childbirth. It also includes a [place] [facility] and lunch. The class is held two consecutive days: beginning on Friday evening from 6:30 - 8:30 p.m. and the next day, Saturday from 9 a.m. - 4 p.m. (call for fee) Doren Custard Class: Interested in a waterbirth?  This informational class will help you discover whether waterbirth is the right fit for you. Education about waterbirth itself, supplies you would need and how to assemble your support team is what you can expect from this class. Some obstetrical practices require this class in order to pursue a waterbirth. (Not all obstetrical practices offer waterbirth-check with your healthcare provider.) Register only the expectant mom, but you are encouraged to bring your partner to class! Required if planning waterbirth, no fee. Infant/Child CPR: Parents, grandparents, babysitters, and friends learn Cardio-Pulmonary Resuscitation skills for infants and children. You will also learn how to treat both conscious and unconscious choking in infants and children. This Family & Friends program does not offer certification. Register each participant individually to ensure that enough mannequins are available. (Call for fee) Grandparent Love: Expecting a grandbaby? This class is for you! Learn about the latest infant care and safety recommendations and ways to support your own child as he or she transitions into the parenting role. Taught by Registered  Nurses who are childbirth instructors, but most importantly...they are grandmothers too! $10/person. Childbirth Class- Natural Childbirth: This series of 5 weekly classes is for expectant parents who want to learn and practice natural methods of coping with the process of labor and childbirth. Relaxation, breathing, massage, visualization, role of the partner, and helpful positioning are highlighted. Participants learn how to be confident in their body's ability to give birth. This class will empower and help parents make informed decisions about their own care. Includes discussion that will help new parents transition into the immediate postpartum period. [facility] is included. We suggest taking this class between 25-32 weeks, but it's only a recommendation. $75 per registrant and one support person or $30 Medicaid. Childbirth Class- 3 week Series: This option of 3 weekly classes helps you and your labor partner prepare for childbirth. Newborn care, labor & birth, cesarean birth, pain management, and comfort techniques are discussed and a [place] [facility] is included. The class meets at the same time, on the same day of the week for 3 consecutive weeks beginning with the starting date you choose. $60 for registrant and one support person.  Marvelous Multiples: Expecting twins, triplets, or more? This class covers the differences in labor, birth, parenting, and breastfeeding issues that face multiples' parents. NICU tour is included. Led by a Certified Childbirth Educator who is the mother of twins. No fee. Caring for Baby: This class is for expectant and adoptive parents who want to learn and practice the most up-to-date newborn care for their babies. Focus is on birth through the first six weeks of  life. Topics include feeding, bathing, diapering, crying, umbilical cord care, circumcision care and safe sleep. Parents learn to recognize symptoms  of illness and when to call the pediatrician. Register only the mom-to-be and your partner or support person can plan to come with you! $10 per registrant and support person Childbirth Class- online option: This online class offers you the freedom to complete a Birth and Baby series in the comfort of your own home. The flexibility of this option allows you to review sections at your own pace, at times convenient to you and your support people. It includes additional video information, animations, quizzes, and extended activities. Get organized with helpful eClass tools, checklists, and trackers. Once you register online for the class, you will receive an email within a few days to accept the invitation and begin the class when the time is right for you. The content will be available to you for 60 days. $60 for 60 days of online access for you and your support people.  Local Doulas: Natural Baby Doulas [email or web address] Tel: [phone] [email or web address] [employer] [phone] [email or web address] The Labor Hassell Halim  (also do waterbirth tub rental) [phone] [email or web address] Triad Birth Doula [phone] [email or web address] NotebookDistributors.fi [facility] Rhythms  [phone] [email or web address] [employer] (PADA) [email or web address] [place] Birth and Baby  [email or web address]/ Considering Waterbirth? Guide for patients at [facility] [employer]  Why consider waterbirth?  . Gentle birth for babies . Less pain medicine used in labor . May allow for passive descent/less pushing . May reduce perineal tears  . More mobility and instinctive maternal position changes . Increased maternal relaxation . Reduced blood pressure in labor  Is waterbirth safe? What are the risks of  infection, drowning or other complications?  . Infection: o Very low risk (3.7 % for tub vs 4.8% for bed) o 7 in 8000 waterbirths with documented infection o Poorly cleaned equipment most common cause o Slightly lower group B strep transmission rate  . Drowning o Maternal:  - Very low risk   - Related to seizures or fainting o Newborn:  - Very low risk. No evidence of increased risk of respiratory problems in multiple large studies - Physiological protection from breathing under water - Avoid underwater birth if there are any fetal complications - Once baby's head is out of the water, keep it out.  . Birth complication o Some reports of cord trauma, but risk decreased by bringing baby to surface gradually o No evidence of increased risk of shoulder dystocia. Mothers can usually change positions faster in water than in a bed, possibly aiding the maneuvers to free the shoulder.   You must attend a Doren Custard class at [facility]  3rd Wednesday of every month from 7-9pm  [employer] by calling [phone] or online at [email or web address]  Bring [place] the certificate from the class to your prenatal appointment  Meet with a midwife at 36 weeks to see if you can still plan a waterbirth and to sign the consent.   Purchase or rent the following supplies:   Water Birth Pool (Birth Pool in a Box or [place] for instance)  (Tubs start ~$125)  Single-use disposable tub liner designed for your brand of tub  New garden hose labeled "lead-free", "suitable for drinking water",  Electric drain pump to remove water (We recommend 792 gallon per hour or greater pump.)   Separate garden hose to remove the dirty water  Fish net  Bathing suit top (optional)  Long-handled mirror (optional)  Places  to purchase or rent supplies  GotWebTools.is for tub purchases and supplies  Waterbirthsolutions.com for tub purchases and supplies  The Labor Ladies  (www.[email or web address]) $275 for tub rental/set-up & take down/kit   [employer] (http://www.[place]ming.com/.htm) Information regarding doulas (labor support) who provide pool rentals  Our practice has a Birth Pool in a Box tub at the hospital that you may borrow on a first-come-first-served basis. It is your responsibility to to set up, clean and break down the tub. We cannot guarantee the availability of this tub in advance. You are responsible for bringing all accessories listed above. If you do not have all necessary supplies you cannot have a waterbirth.    Things that would prevent you from having a waterbirth:  Premature, <37wks  Previous cesarean birth  Presence of thick meconium-stained fluid  Multiple gestation (Twins, triplets, etc.)  Uncontrolled diabetes or gestational diabetes requiring medication  Hypertension requiring medication or diagnosis of pre-eclampsia  Heavy vaginal bleeding  Non-reassuring fetal heart rate  Active infection (MRSA, etc.). Group B Strep is NOT a contraindication for  waterbirth.  If your labor has to be induced and induction method requires continuous  monitoring of the baby's heart rate  Other risks/issues identified by your obstetrical provider  Please remember that birth is unpredictable. Under certain unforeseeable circumstances your provider may advise against giving birth in the tub. These decisions will be made on a case-by-case basis and with the safety of you and your baby as our highest priority.     [facility] [place] [address] [place], [place]  [address] Phone - [phone]   Fax - [phone]  [facility] [address] [place], [place] [address] Phone - [phone]   Fax - [place] [address] [place], [place]  [address] Phone - [phone]   Fax - [phone]  [place]. [address] [place] [address] [place], [place]  [address] Phone - [phone]   Fax - [phone]  [place] [address] [place], [place]  [address] Phone - [phone]   Fax - [phone]  [facility] [address] [place], [place]  [address] Phone - [phone]   Fax - [place] [address], [place], [place]  [address] Phone - [phone]   Fax - [phone]  [place] [address] [place], [place] [address] [place], [place]  [address] Phone - [phone]   Fax - [place] [address] [place], [place]  [address] Phone - [phone]   Fax - [phone] [facility] [place]. [address] [place], [place]  [address] Phone - [phone]   Fax - [phone]  [place] [address] [place], [place]  [address] Phone - [phone]   Fax - [phone]  [facility] AT [place], [place], [place]  [address] Phone - [phone]   Fax - [place] [address] [place], [place]  [address] Phone - [phone]   Fax - [phone]  [facility] [address] [place], [place]  [address] Phone - [place], [place]  [address] Phone - [phone]   Fax - [place] [address]  [place], [place]  [address] Phone - [phone]   Fax - [phone]  [place] [address] [place], [place]  [address] Phone - [phone]   Fax - [place], [place]  [address] Phone - [phone]   Fax - [place], [place], [place]  [address] Phone -  [phone]   Fax - [place] [address], [place], [place]  [address] Phone - [phone]   Fax - [phone]  [place] [address], [place], [place]  [address] Phone - [phone]   Fax - [place] [address] [place], [place]  [address] Phone - [phone]   Fax - [phone]  [place] [address] [place], [place]  [address] Phone - [phone]   Fax - [phone] [place] [address] [place], [place]  [address] Phone - [phone]   Fax - [place] [address] [place], [place]  [address] Phone - [phone]   Fax - [place] [address], [place], [place]  [address] Phone - [phone]   Fax - [phone]  [place] MD [address] [place] [address] Phone [phone]  Fax [phone]     Deciding about Circumcision in Baby Boys  (The Basics)  What is circumcision?  Circumcision is a surgery that removes the skin that covers the tip of the penis, called the "foreskin" Circumcision is usually done when a boy is between 44 and 32 days old. In the [place], circumcision is common. In some other countries, fewer boys are circumcised. Circumcision is a common tradition in some religions.  Should I have my baby boy circumcised?  There is no easy answer. Circumcision has some benefits. But it also has risks. After talking with your doctor, you will have to decide for yourself what is right for your family.  What are the benefits of circumcision?  Circumcised boys seem to have slightly lower rates of: ?Urinary tract infections ?Swelling of the opening at the tip of the penis Circumcised men seem to have slightly lower rates of: ?Urinary tract  infections ?Swelling of the opening at the tip of the penis ?Penis cancer ?HIV and other infections that you catch during sex ?Cervical cancer in the women they have sex with Even so, in the [place], the risks of these problems are small - even in boys and men who have not been circumcised. Plus, boys and men who are not circumcised can reduce these extra risks by: ?Cleaning their penis well ?Using condoms during sex  What are the risks of circumcision?  Risks include: ?Bleeding or infection from the surgery ?Damage to or amputation of the penis ?A chance that the doctor will cut off too much or not enough of the foreskin ?A chance that sex won't feel as good later in life Only about 1 out of every 200 circumcisions leads to problems. There is also a chance that your health insurance won't pay for circumcision.  How is circumcision done in baby boys?  First, the baby gets medicine for pain relief. This might be a cream on the skin or a shot into the base of the penis. Next, the doctor cleans the baby's penis well. Then he or she  uses special tools to cut off the foreskin. Finally, the doctor wraps a bandage (called gauze) around the baby's penis. If you have your baby circumcised, his doctor or nurse will give you instructions on how to care for him after the surgery. It is important that you follow those instructions carefully.            PREGNANCY SUPPORT BELT: You are not alone, Seventy-five percent of women have some sort of abdominal or back pain at some point in their pregnancy. Your baby is growing at a fast pace, which means that your whole body is rapidly trying to adjust to the changes. As your uterus grows, your back may start feeling a bit under stress and this can result in back or abdominal pain that can go from mild, and therefore bearable, to severe pains that will not allow you to sit or lay down comfortably, When it comes to dealing with pregnancy-related  pains and cramps, some pregnant women usually prefer natural remedies, which the market is filled with nowadays. For example, wearing a pregnancy support belt can help ease and lessen your discomfort and pain. WHAT ARE THE BENEFITS OF WEARING A PREGNANCY SUPPORT BELT? A pregnancy support belt provides support to the lower portion of the belly taking some of the weight of the growing uterus and distributing to the other parts of your body. It is designed make you comfortable and gives you extra support. Over the years, the pregnancy apparel market has been studying the needs and wants of pregnant women and they have come up with the most comfortable pregnancy support belts that woman could ever ask for. In fact, you will no longer have to wear a stretched-out or bulky pregnancy belt that is visible underneath your clothes and makes you feel even more uncomfortable. Nowadays, a pregnancy support belt is made of comfortable and stretchy materials that will not irritate your skin but will actually make you feel at ease and you will not even notice you are wearing it. They are easy to put on and adjust during the day and can be worn at night for additional support.  BENEFITS: . Relives Back pain . Relieves Abdominal Muscle and Leg Pain . Stabilizes the Pelvic Ring . Offers a Cushioned Abdominal Lift Pad . Relieves pressure on the Sciatic Nerve Within Minutes WHERE TO GET YOUR PREGNANCY BELT: [employer] [phone] '@[phone]'  [place], [place] [address]

## 2019-01-07 ENCOUNTER — Ambulatory Visit (HOSPITAL_COMMUNITY)
Admission: RE | Admit: 2019-01-07 | Discharge: 2019-01-07 | Disposition: A | Discharge status: Home / Self Care | Payer: Medicaid Other | Source: Ambulatory Visit | Attending: Family Medicine | Admitting: Family Medicine

## 2019-01-07 DIAGNOSIS — Z3A19 19 weeks gestation of pregnancy: Secondary | ICD-10-CM | POA: Diagnosis not present

## 2019-01-07 DIAGNOSIS — Z3402 Encounter for supervision of normal first pregnancy, second trimester: Secondary | ICD-10-CM | POA: Diagnosis not present

## 2019-01-07 DIAGNOSIS — Z363 Encounter for antenatal screening for malformations: Secondary | ICD-10-CM

## 2019-01-10 ENCOUNTER — Telehealth: Payer: Self-pay | Admitting: Obstetrics and Gynecology

## 2019-01-10 NOTE — Telephone Encounter (Signed)
Called in stated she is experiencing issues with her wrists and legs. Stated she was told on a previous visit to be on the look out for pregnancy arthritis. Informed the patient she could visit an urgent care however we our office is a specialist clinic primarily for GYN. Patient understood and stated she only has pregnancy medicaid.

## 2019-01-17 ENCOUNTER — Other Ambulatory Visit: Payer: Self-pay

## 2019-01-18 ENCOUNTER — Telehealth: Payer: Self-pay | Admitting: Certified Nurse Midwife

## 2019-01-18 ENCOUNTER — Other Ambulatory Visit: Payer: Self-pay | Admitting: Advanced Practice Midwife

## 2019-01-18 ENCOUNTER — Encounter (HOSPITAL_COMMUNITY): Payer: Self-pay

## 2019-01-18 ENCOUNTER — Inpatient Hospital Stay (HOSPITAL_COMMUNITY)
Admission: AD | Admit: 2019-01-18 | Discharge: 2019-01-18 | Disposition: A | Discharge status: Home / Self Care | Payer: Medicaid Other | Attending: Obstetrics and Gynecology | Admitting: Obstetrics and Gynecology

## 2019-01-18 ENCOUNTER — Other Ambulatory Visit: Payer: Self-pay

## 2019-01-18 DIAGNOSIS — O99512 Diseases of the respiratory system complicating pregnancy, second trimester: Secondary | ICD-10-CM | POA: Diagnosis not present

## 2019-01-18 DIAGNOSIS — R05 Cough: Secondary | ICD-10-CM | POA: Insufficient documentation

## 2019-01-18 DIAGNOSIS — Z3A20 20 weeks gestation of pregnancy: Secondary | ICD-10-CM | POA: Diagnosis not present

## 2019-01-18 DIAGNOSIS — Z8709 Personal history of other diseases of the respiratory system: Secondary | ICD-10-CM

## 2019-01-18 DIAGNOSIS — R059 Cough, unspecified: Secondary | ICD-10-CM

## 2019-01-18 DIAGNOSIS — O26892 Other specified pregnancy related conditions, second trimester: Secondary | ICD-10-CM | POA: Diagnosis not present

## 2019-01-18 DIAGNOSIS — O368120 Decreased fetal movements, second trimester, not applicable or unspecified: Secondary | ICD-10-CM

## 2019-01-18 DIAGNOSIS — J069 Acute upper respiratory infection, unspecified: Secondary | ICD-10-CM | POA: Diagnosis not present

## 2019-01-18 LAB — INFLUENZA PANEL BY PCR (TYPE A & B)
INFLAPCR: NEGATIVE
Influenza B By PCR: NEGATIVE

## 2019-01-18 MED ORDER — ALBUTEROL SULFATE HFA 108 (90 BASE) MCG/ACT IN AERS: 1.0000 | INHALATION_SPRAY | Freq: Four times a day (QID) | RESPIRATORY_TRACT | 3 refills | Status: DC | PRN

## 2019-01-18 MED ORDER — BUDESONIDE-FORMOTEROL FUMARATE 80-4.5 MCG/ACT IN AERO: 1.0000 | INHALATION_SPRAY | Freq: Two times a day (BID) | RESPIRATORY_TRACT | 2 refills | Status: DC | PRN

## 2019-01-18 NOTE — Telephone Encounter (Signed)
Called pt to inform her of neg Flu test.

## 2019-01-18 NOTE — Discharge Instructions (Signed)
Upper Respiratory Infection, Adult An upper respiratory infection (URI) is a common viral infection of the nose, throat, and upper air passages that lead to the lungs. The most common type of URI is the common cold. URIs usually get better on their own, without medical treatment. What are the causes? A URI is caused by a virus. You may catch a virus by:  Breathing in droplets from an infected person's cough or sneeze.  Touching something that has been exposed to the virus (contaminated) and then touching your mouth, nose, or eyes. What increases the risk? You are more likely to get a URI if:  You are very young or very old.  It is autumn or winter.  You have close contact with others, such as at a daycare, school, or health care facility.  You smoke.  You have long-term (chronic) heart or lung disease.  You have a weakened disease-fighting (immune) system.  You have nasal allergies or asthma.  You are experiencing a lot of stress.  You work in an area that has poor air circulation.  You have poor nutrition. What are the signs or symptoms? A URI usually involves some of the following symptoms:  Runny or stuffy (congested) nose.  Sneezing.  Cough.  Sore throat.  Headache.  Fatigue.  Fever.  Loss of appetite.  Pain in your forehead, behind your eyes, and over your cheekbones (sinus pain).  Muscle aches.  Redness or irritation of the eyes.  Pressure in the ears or face. How is this diagnosed? This condition may be diagnosed based on your medical history and symptoms, and a physical exam. Your health care provider may use a cotton swab to take a mucus sample from your nose (nasal swab). This sample can be tested to determine what virus is causing the illness. How is this treated? URIs usually get better on their own within 7-10 days. You can take steps at home to relieve your symptoms. Medicines cannot cure URIs, but your health care provider may recommend  certain medicines to help relieve symptoms, such as:  Over-the-counter cold medicines.  Cough suppressants. Coughing is a type of defense against infection that helps to clear the respiratory system, so take these medicines only as recommended by your health care provider.  Fever-reducing medicines. Follow these instructions at home: Activity  Rest as needed.  If you have a fever, stay home from work or school until your fever is gone or until your health care provider says you are no longer contagious. Your health care provider may have you wear a face mask to prevent your infection from spreading. Relieving symptoms  Gargle with a salt-water mixture 3-4 times a day or as needed. To make a salt-water mixture, completely dissolve -1 tsp of salt in 1 cup of warm water.  Use a cool-mist humidifier to add moisture to the air. This can help you breathe more easily. Eating and drinking   Drink enough fluid to keep your urine pale yellow.  Eat soups and other clear broths. General instructions   Take over-the-counter and prescription medicines only as told by your health care provider. These include cold medicines, fever reducers, and cough suppressants.  Do not use any products that contain nicotine or tobacco, such as cigarettes and e-cigarettes. If you need help quitting, ask your health care provider.  Stay away from secondhand smoke.  Stay up to date on all immunizations, including the yearly (annual) flu vaccine.  Keep all follow-up visits as told by your health  care provider. This is important. How to prevent the spread of infection to others   URIs can be passed from person to person (are contagious). To prevent the infection from spreading: ? Wash your hands often with soap and water. If soap and water are not available, use hand sanitizer. ? Avoid touching your mouth, face, eyes, or nose. ? Cough or sneeze into a tissue or your sleeve or elbow instead of into your hand  or into the air. Contact a health care provider if:  You are getting worse instead of better.  You have a fever or chills.  Your mucus is brown or red.  You have yellow or brown discharge coming from your nose.  You have pain in your face, especially when you bend forward.  You have swollen neck glands.  You have pain while swallowing.  You have white areas in the back of your throat. Get help right away if:  You have shortness of breath that gets worse.  You have severe or persistent: ? Headache. ? Ear pain. ? Sinus pain. ? Chest pain.  You have chronic lung disease along with any of the following: ? Wheezing. ? Prolonged cough. ? Coughing up blood. ? A change in your usual mucus.  You have a stiff neck.  You have changes in your: ? Vision. ? Hearing. ? Thinking. ? Mood. Summary  An upper respiratory infection (URI) is a common infection of the nose, throat, and upper air passages that lead to the lungs.  A URI is caused by a virus.  URIs usually get better on their own within 7-10 days.  Medicines cannot cure URIs, but your health care provider may recommend certain medicines to help relieve symptoms. This information is not intended to replace advice given to you by your health care provider. Make sure you discuss any questions you have with your health care provider. Document Released: 05/16/2001 Document Revised: 07/06/2017 Document Reviewed: 07/06/2017 Elsevier Interactive Patient Education  2019 [employer] Inc[employer]   Cough, Adult  Coughing is a reflex that clears your throat and your airways. Coughing helps to heal and protect your lungs. It is normal to cough occasionally, but a cough that happens with other symptoms or lasts a long time may be a sign of a condition that needs treatment. A cough may last only 2-3 weeks (acute), or it may last longer than 8 weeks (chronic). What are the causes? Coughing is commonly caused by:  Breathing in substances  that irritate your lungs.  A viral or bacterial respiratory infection.  Allergies.  Asthma.  Postnasal drip.  Smoking.  Acid backing up from the stomach into the esophagus (gastroesophageal reflux).  Certain medicines.  Chronic lung problems, including COPD (or rarely, lung cancer).  Other medical conditions such as heart failure. Follow these instructions at home: Pay attention to any changes in your symptoms. Take these actions to help with your discomfort:  Take medicines only as told by your health care provider. ? If you were prescribed an antibiotic medicine, take it as told by your health care provider. Do not stop taking the antibiotic even if you start to feel better. ? Talk with your health care provider before you take a cough suppressant medicine.  Drink enough fluid to keep your urine clear or pale yellow.  If the air is dry, use a cold steam vaporizer or humidifier in your bedroom or your home to help loosen secretions.  Avoid anything that causes you to cough at work  or at home.  If your cough is worse at night, try sleeping in a semi-upright position.  Avoid cigarette smoke. If you smoke, quit smoking. If you need help quitting, ask your health care provider.  Avoid caffeine.  Avoid alcohol.  Rest as needed. Contact a health care provider if:  You have new symptoms.  You cough up pus.  Your cough does not get better after 2-3 weeks, or your cough gets worse.  You cannot control your cough with suppressant medicines and you are losing sleep.  You develop pain that is getting worse or pain that is not controlled with pain medicines.  You have a fever.  You have unexplained weight loss.  You have night sweats. Get help right away if:  You cough up blood.  You have difficulty breathing.  Your heartbeat is very fast. This information is not intended to replace advice given to you by your health care provider. Make sure you discuss any  questions you have with your health care provider. Document Released: 05/19/2011 Document Revised: 04/27/2016 Document Reviewed: 01/27/2015 Elsevier Interactive Patient Education  2019 [employer].
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
## 2019-01-18 NOTE — MAU Note (Signed)
Whitney Gonzalez is a 28 y.o. at [redacted]w[redacted]d here in MAU reporting:  +congestion +cough Denies fever +sore throat +decreased movement; states has felt movement just not as often as she is use to Onset of complaint: Monday expresses's worry about this in relation to her asthma. Endorses taking mucinex; none taken today. Pain score: denies Vitals:   01/18/19 1117  BP: 125/78  Pulse: (!) 119  Resp: 18  Temp: 98 F (36.7 C)  SpO2: 98%    FHT: 153 via doppler  Lab orders placed from triage: ua

## 2019-01-20 ENCOUNTER — Telehealth: Payer: Self-pay | Admitting: General Practice

## 2019-01-20 NOTE — Telephone Encounter (Signed)
Patient called and left message on nurse voicemail line stating she talked to someone yesterday about approved medications for her to take in pregnancy for a cold. Patient states she has got worse and isn't sure what she should do. Per chart review, patient went to MAU for concerns.

## 2019-01-24 ENCOUNTER — Other Ambulatory Visit: Payer: Self-pay | Admitting: Internal Medicine

## 2019-01-24 MED ORDER — OSELTAMIVIR PHOSPHATE 75 MG PO CAPS: 75.0000 mg | ORAL_CAPSULE | Freq: Two times a day (BID) | ORAL | 0 refills | Status: AC

## 2019-01-24 MED ORDER — OSELTAMIVIR PHOSPHATE 75 MG PO CAPS: 75.0000 mg | ORAL_CAPSULE | Freq: Two times a day (BID) | ORAL | 0 refills | Status: DC

## 2019-01-24 NOTE — Addendum Note (Signed)
Addended by: Melina Schools on: 01/24/2019 10:20 PM   Modules accepted: Orders

## 2019-01-24 NOTE — Progress Notes (Signed)
Whitney Gonzalez is 28 y.o. G4P0030 female at [redacted]w[redacted]d who called MAU and talked to nursing staff. She was seen on 2/15 for viral URI. Reports symptoms have persisted and she has had contact within past 48 hours with someone who has tested positive for Flu A. Will send in Rx for Tamiflu to pharmacy. Reasons to return to MAU discussed with patient.   Phill Myron, D.O. [facility] Medicine Fellow, [facility] for [facility] 01/24/2019, 10:19 PM

## 2019-01-30 ENCOUNTER — Ambulatory Visit (INDEPENDENT_AMBULATORY_CARE_PROVIDER_SITE_OTHER): Payer: Medicaid Other | Admitting: Obstetrics and Gynecology

## 2019-01-30 VITALS — BP 123/69 | HR 79 | Wt 139.6 lb

## 2019-01-30 DIAGNOSIS — Z3A22 22 weeks gestation of pregnancy: Secondary | ICD-10-CM

## 2019-01-30 DIAGNOSIS — Z3402 Encounter for supervision of normal first pregnancy, second trimester: Secondary | ICD-10-CM

## 2019-01-30 MED ORDER — TERCONAZOLE 0.4 % VA CREA: 1.0000 | TOPICAL_CREAM | Freq: Every day | VAGINAL | 0 refills | Status: DC

## 2019-01-30 NOTE — Progress Notes (Signed)
Last Wednesday exposed to flu type A.  Feels fine took medication 1 pill daily for 10 days instead of 2 pills daily for 5 days

## 2019-01-30 NOTE — Progress Notes (Signed)
   PRENATAL VISIT NOTE  Subjective:  Whitney Gonzalez is a 27 y.o. G4P0030 at [redacted]w[redacted]d being seen today for ongoing prenatal care.  She is currently monitored for the following issues for this low-risk pregnancy and has Bipolar I disorder ([place]); Dysuria; Subchorionic hematoma; Abdominal pain affecting pregnancy; Supervision of low-risk first pregnancy; History of maternal syphilis, currently pregnant; Abnormal Pap smear of cervix; and Round ligament pain on their problem list.  Patient reports vaginal irritation. Some itching. Has not tried anything OTC.   Contractions: Not present. Vag. Bleeding: None.  Movement: Present. Denies leaking of fluid.   The following portions of the patient's history were reviewed and updated as appropriate: allergies, current medications, past family history, past medical history, past social history, past surgical history and problem list. Problem list updated.  Objective:   Vitals:   01/30/19 1124  BP: 123/69  Pulse: 79  Weight: 139 lb 9.6 oz (63.3 kg)    Fetal Status: Fetal Heart Rate (bpm): 144 Fundal Height: 23 cm Movement: Present     General:  Alert, oriented and cooperative. Patient is in no acute distress.  Skin: Skin is warm and dry. No rash noted.   Cardiovascular: Normal heart rate noted  Respiratory: Normal respiratory effort, no problems with respiration noted  Abdomen: Soft, gravid, appropriate for gestational age.  Pain/Pressure: Present     Pelvic: Cervical exam deferred        Extremities: Normal range of motion.  Edema: Trace  Mental Status: Normal mood and affect. Normal behavior. Normal judgment and thought content.   Assessment and Plan:  Pregnancy: [id number] at [redacted]w[redacted]d  1. Encounter for supervision of low-risk first pregnancy in second trimester  Doing well 2 hour GTT next visit anticipatory guidance.  Vaginal itching: Terazol 7 called in. Will check at 28 weeks visit if no improvement.   Preterm labor symptoms and general  obstetric precautions including but not limited to vaginal bleeding, contractions, leaking of fluid and fetal movement were reviewed in detail with the patient. Please refer to After Visit Summary for other counseling recommendations.  Return in about 4 weeks (around 02/27/2019) for Come fasting for 2 hour GTT .  No future appointments.  Noni Saupe, NP

## 2019-02-04 ENCOUNTER — Encounter: Payer: Self-pay | Admitting: Family Medicine

## 2019-02-04 ENCOUNTER — Ambulatory Visit (INDEPENDENT_AMBULATORY_CARE_PROVIDER_SITE_OTHER): Payer: Medicaid Other | Admitting: Family Medicine

## 2019-02-04 VITALS — BP 124/84 | HR 93 | Wt 142.1 lb

## 2019-02-04 DIAGNOSIS — R109 Unspecified abdominal pain: Secondary | ICD-10-CM

## 2019-02-04 DIAGNOSIS — R51 Headache: Secondary | ICD-10-CM

## 2019-02-04 DIAGNOSIS — O26892 Other specified pregnancy related conditions, second trimester: Secondary | ICD-10-CM

## 2019-02-04 DIAGNOSIS — H538 Other visual disturbances: Secondary | ICD-10-CM

## 2019-02-04 DIAGNOSIS — O26899 Other specified pregnancy related conditions, unspecified trimester: Secondary | ICD-10-CM

## 2019-02-04 DIAGNOSIS — Z3402 Encounter for supervision of normal first pregnancy, second trimester: Secondary | ICD-10-CM

## 2019-02-04 DIAGNOSIS — Z3A23 23 weeks gestation of pregnancy: Secondary | ICD-10-CM

## 2019-02-04 DIAGNOSIS — K649 Unspecified hemorrhoids: Secondary | ICD-10-CM

## 2019-02-04 NOTE — Progress Notes (Signed)
 PRENATAL VISIT NOTE  Subjective:  Whitney Gonzalez is a 28 y.o. G4P0030 at [redacted]w[redacted]d being seen today for ongoing prenatal care.  She is currently monitored for the following issues for this low-risk pregnancy and has Bipolar I disorder ([facility]); Dysuria; Subchorionic hematoma; Abdominal pain affecting pregnancy; Supervision of low-risk first pregnancy; History of maternal syphilis, currently pregnant; Abnormal Pap smear of cervix; and Round ligament pain on their problem list.  Patient reports several symptoms.  Contractions: Not present. Vag. Bleeding: None.  Movement: Present. Denies leaking of fluid.   Swelling of face, hands, feet - started around January in fingers; rings feel tighter - face swollen in last week as well; feels rounder - feet also swollen, but shoes still fit  Headache - started last night - harder to fall asleep - has migraines outside of pregnancy - hasn't taken any medication to help  Difficulty breathing  - overall ok, but feels short of breath at times  Limps numb - worse at night  Dizzy and nauseous  - started last night - no falls or syncope  Sharp pain on R side - lasted for about 3-4 hours a few days ago - sore and painful - went away on it's own - pain completely gone now  L breast leaking - happens when she is in pain - no leaking outside of pain - looks like breastmilk   Blurred vision - started about a week ago - comes and goes - preceded by "feeling weird or off" - has problems with vision outside of pregnancy as well but "not like this" - lasts 1-2 minutes  Lower back and pelvic pain - worse after work   Hemorrhoids - felt lots of pain in hemorrhoid with all positions - having at least one bowel movement a day - loose stool - burns coming out - blood with wiping  Difficulty working - having trouble with pain and discomfort with work - prefers not to work several days straight because she feels she neglects her health at  work - cannot eat, drink or use the restroom like she should  The following portions of the patient's history were reviewed and updated as appropriate: allergies, current medications, past family history, past medical history, past social history, past surgical history and problem list. Problem list updated.  Objective:   Vitals:   02/04/19 1551  BP: 124/84  Pulse: 93  Weight: 142 lb 1.6 oz (64.5 kg)    Fetal Status: Fetal Heart Rate (bpm): 154   Movement: Present     General:  Alert, oriented and cooperative. Patient is in no acute distress.  Skin: Skin is warm and dry. No rash noted.   Cardiovascular: Normal heart rate noted  Respiratory: Normal respiratory effort, no problems with respiration noted  Abdomen: Soft, gravid, appropriate for gestational age.  Pain/Pressure: Present     Pelvic: Cervical exam deferred        Extremities: Normal range of motion.  Edema: Trace  Mental Status: Normal mood and affect. Normal behavior. Normal judgment and thought content.   Assessment and Plan:  Pregnancy: G4P0030 at [redacted]w[redacted]d  1. Encounter for supervision of low-risk first pregnancy in second trimester - patient with many concerns today, but overall normal exam  - reassurance provided and workup ordered as below - return precautions discussed in detail - follow up as scheduled or sooner if needed  2. Blurry vision - Comp Met (CMET) - CBC - Protein / creatinine ratio, urine  3. Pregnancy headache,   antepartum - Comp Met (CMET) - CBC - Protein / creatinine ratio, urine  4. Abdominal pain affecting pregnancy - Comp Met (CMET) - CBC - Protein / creatinine ratio, urine  5. Hemorrhoids, unspecified hemorrhoid type - start anusol PRN  Preterm labor symptoms and general obstetric precautions including but not limited to vaginal bleeding, contractions, leaking of fluid and fetal movement were reviewed in detail with the patient. Please refer to After Visit Summary for other counseling  recommendations.  No follow-ups on file.  Future Appointments  Date Time Provider Department Center  03/27/2019  8:20 AM [facility]  03/27/2019  9:15 AM Rasch, Jennifer I, NP [facility]     , MD  

## 2019-02-04 NOTE — Progress Notes (Signed)
Pt concerned of face swelling , hands, feet & it's not really going away with elevation. Having a bad headache since last night, trouble sleeping. Felt like she couldn't breathe no matter how she laid. Felt like she wasn't getting enough oxygen, limbs falling asleep. This past weekend when she went to sleep woke up to a severe sharp pain on right side, stayed with her for 3 to 4 hours. Also left breast has been leaking w/ pain.Blurred vision, sever lower back & pelvic pain. Hemorrhoids also. Has not taken any pain relievers for pain.

## 2019-02-05 LAB — COMPREHENSIVE METABOLIC PANEL
ALT: 13 IU/L (ref 0–32)
AST: 14 IU/L (ref 0–40)
Albumin/Globulin Ratio: 1.5 (ref 1.2–2.2)
Albumin: 3.9 g/dL (ref 3.9–5.0)
Alkaline Phosphatase: 59 IU/L (ref 39–117)
BUN/Creatinine Ratio: 13 (ref 9–23)
BUN: 6 mg/dL (ref 6–20)
Bilirubin Total: <0.2 mg/dL (ref 0.0–1.2)
CO2: 21 mmol/L (ref 20–29)
CREATININE: 0.48 mg/dL — AB (ref 0.57–1.00)
Calcium: 9.6 mg/dL (ref 8.7–10.2)
Chloride: 102 mmol/L (ref 96–106)
GFR calc Af Amer: 157 mL/min/{1.73_m2} (ref >59)
GFR calc non Af Amer: 136 mL/min/{1.73_m2} (ref >59)
Globulin, Total: 2.6 g/dL (ref 1.5–4.5)
Glucose: 78 mg/dL (ref 65–99)
Potassium: 4 mmol/L (ref 3.5–5.2)
SODIUM: 139 mmol/L (ref 134–144)
Total Protein: 6.5 g/dL (ref 6.0–8.5)

## 2019-02-05 LAB — CBC
Hematocrit: 34.4 % (ref 34.0–46.6)
Hemoglobin: 12.1 g/dL (ref 11.1–15.9)
MCH: 33 pg (ref 26.6–33.0)
MCHC: 35.2 g/dL (ref 31.5–35.7)
MCV: 94 fL (ref 79–97)
Platelets: 254 10*3/uL (ref 150–450)
RBC: 3.67 x10E6/uL — ABNORMAL LOW (ref 3.77–5.28)
RDW: 12.8 % (ref 11.7–15.4)
WBC: 9 10*3/uL (ref 3.4–10.8)

## 2019-02-05 LAB — PROTEIN / CREATININE RATIO, URINE
Creatinine, Urine: 28.5 mg/dL
Protein, Ur: 5 mg/dL
Protein/Creat Ratio: 175 mg/g creat (ref 0–200)

## 2019-02-06 ENCOUNTER — Telehealth: Payer: Self-pay | Admitting: *Deleted

## 2019-02-06 NOTE — Telephone Encounter (Signed)
Whitney Gonzalez left a message she is calling for results of her labwork on Tuesday for platelets - states hasn't heard anything.   I called [name] and informed her that her CBC,CMET and Pr/cr ratio were normal. She voices relief and understanding and states she is doing fine, no complaints.

## 2019-02-13 MED ORDER — HYDROCORTISONE 2.5 % RE CREA: 1.0000 "application " | TOPICAL_CREAM | Freq: Two times a day (BID) | RECTAL | 3 refills | Status: DC

## 2019-02-14 ENCOUNTER — Telehealth: Payer: Self-pay | Admitting: Family Medicine

## 2019-02-14 NOTE — Telephone Encounter (Signed)
Lm that I am returning her call and that I will send a MyChart message.  Pt returned call and per Dr. Rosana Hoes recommendation I informed pt that we are currently not taking pt's out of work due to coronavirus due to there not being guidelines set by the CDC.  Pt stated understanding.

## 2019-02-14 NOTE — Telephone Encounter (Signed)
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 Requesting a call back from nures ASAP. She has to work tomorrow, and does not want to go to work.

## 2019-02-25 ENCOUNTER — Encounter: Payer: Self-pay | Admitting: *Deleted

## 2019-02-26 ENCOUNTER — Telehealth: Payer: Self-pay | Admitting: Obstetrics and Gynecology

## 2019-02-26 NOTE — Telephone Encounter (Signed)
Patient was called and a VM was left for her to not come into the office, but to be ready to received a call from the provide.

## 2019-02-27 ENCOUNTER — Other Ambulatory Visit: Payer: Self-pay

## 2019-02-27 ENCOUNTER — Ambulatory Visit (INDEPENDENT_AMBULATORY_CARE_PROVIDER_SITE_OTHER): Payer: Medicaid Other | Admitting: Obstetrics and Gynecology

## 2019-02-27 DIAGNOSIS — Z3402 Encounter for supervision of normal first pregnancy, second trimester: Secondary | ICD-10-CM

## 2019-02-27 DIAGNOSIS — Z3A26 26 weeks gestation of pregnancy: Secondary | ICD-10-CM

## 2019-02-27 NOTE — Progress Notes (Signed)
I connected with  Whitney Gonzalez on 02/27/19 at 10:15 AM EDT by telephone and verified that I am speaking with the correct person using two identifiers.   I discussed the limitations, risks, security and privacy concerns of performing an evaluation and management service by telephone and the availability of in person appointments. I also discussed with the patient that there may be a patient responsible charge related to this service. The patient expressed understanding and agreed to proceed.  Jenera, CMA 02/27/2019  10:35 AM

## 2019-02-27 NOTE — Patient Instructions (Signed)
Tdap Vaccine (Tetanus, Diphtheria and Pertussis): What You Need to Know  1. Why get vaccinated?  Tetanus, diphtheria and pertussis are very serious diseases. Tdap vaccine can protect us from these diseases. And, Tdap vaccine given to pregnant women can protect newborn babies against pertussis..  TETANUS (Lockjaw) is rare in the [place] today. It causes painful muscle tightening and stiffness, usually all over the body.  · It can lead to tightening of muscles in the head and neck so you can't open your mouth, swallow, or sometimes even breathe. Tetanus kills about 1 out of 10 people who are infected even after receiving the best medical care.  DIPHTHERIA is also rare in the [place] today. It can cause a thick coating to form in the back of the throat.  · It can lead to breathing problems, heart failure, paralysis, and death.  PERTUSSIS (Whooping Cough) causes severe coughing spells, which can cause difficulty breathing, vomiting and disturbed sleep.  · It can also lead to weight loss, incontinence, and rib fractures. Up to 2 in 100 adolescents and 5 in 100 adults with pertussis are hospitalized or have complications, which could include pneumonia or death.  These diseases are caused by bacteria. Diphtheria and pertussis are spread from person to person through secretions from coughing or sneezing. Tetanus enters the body through cuts, scratches, or wounds.  Before vaccines, as many as 200,000 cases of diphtheria, 200,000 cases of pertussis, and hundreds of cases of tetanus, were reported in the [place] each year. Since vaccination began, reports of cases for tetanus and diphtheria have dropped by about 99% and for pertussis by about 80%.  2. Tdap vaccine  Tdap vaccine can protect adolescents and adults from tetanus, diphtheria, and pertussis. One dose of Tdap is routinely given at age 11 or 12. People who did not get Tdap at that age should get it as soon as possible.  Tdap is especially important  for healthcare professionals and anyone having close contact with a baby younger than 12 months.  Pregnant women should get a dose of Tdap during every pregnancy, to protect the newborn from pertussis. Infants are most at risk for severe, life-threatening complications from pertussis.  Another vaccine, called Td, protects against tetanus and diphtheria, but not pertussis. A Td booster should be given every 10 years. Tdap may be given as one of these boosters if you have never gotten Tdap before. Tdap may also be given after a severe cut or burn to prevent tetanus infection.  Your doctor or the person giving you the vaccine can give you more information.  Tdap may safely be given at the same time as other vaccines.  3. Some people should not get this vaccine  · A person who has ever had a life-threatening allergic reaction after a previous dose of any diphtheria, tetanus or pertussis containing vaccine, OR has a severe allergy to any part of this vaccine, should not get Tdap vaccine. Tell the person giving the vaccine about any severe allergies.  · Anyone who had coma or long repeated seizures within 7 days after a childhood dose of DTP or DTaP, or a previous dose of Tdap, should not get Tdap, unless a cause other than the vaccine was found. They can still get Td.  · Talk to your doctor if you:  ? have seizures or another nervous system problem,  ? had severe pain or swelling after any vaccine containing diphtheria, tetanus or pertussis,  ? ever had a condition   called Guillain-Barré Syndrome (GBS),  ? aren't feeling well on the day the shot is scheduled.  4. Risks  With any medicine, including vaccines, there is a chance of side effects. These are usually mild and go away on their own. Serious reactions are also possible but are rare.  Most people who get Tdap vaccine do not have any problems with it.  Mild problems following Tdap  (Did not interfere with activities)  · Pain where the shot was given (about 3 in 4  adolescents or 2 in 3 adults)  · Redness or swelling where the shot was given (about 1 person in 5)  · Mild fever of at least 100.4°F (up to about 1 in 25 adolescents or 1 in 100 adults)  · Headache (about 3 or 4 people in 10)  · Tiredness (about 1 person in 3 or 4)  · Nausea, vomiting, diarrhea, stomach ache (up to 1 in 4 adolescents or 1 in 10 adults)  · Chills, sore joints (about 1 person in 10)  · Body aches (about 1 person in 3 or 4)  · Rash, swollen glands (uncommon)  Moderate problems following Tdap  (Interfered with activities, but did not require medical attention)  · Pain where the shot was given (up to 1 in 5 or 6)  · Redness or swelling where the shot was given (up to about 1 in 16 adolescents or 1 in 12 adults)  · Fever over 102°F (about 1 in 100 adolescents or 1 in 250 adults)  · Headache (about 1 in 7 adolescents or 1 in 10 adults)  · Nausea, vomiting, diarrhea, stomach ache (up to 1 or 3 people in 100)  · Swelling of the entire arm where the shot was given (up to about 1 in 500).  Severe problems following Tdap  (Unable to perform usual activities; required medical attention)  · Swelling, severe pain, bleeding and redness in the arm where the shot was given (rare).  Problems that could happen after any vaccine:  · People sometimes faint after a medical procedure, including vaccination. Sitting or lying down for about 15 minutes can help prevent fainting, and injuries caused by a fall. Tell your doctor if you feel dizzy, or have vision changes or ringing in the ears.  · Some people get severe pain in the shoulder and have difficulty moving the arm where a shot was given. This happens very rarely.  · Any medication can cause a severe allergic reaction. Such reactions from a vaccine are very rare, estimated at fewer than 1 in a million doses, and would happen within a few minutes to a few hours after the vaccination.  As with any medicine, there is a very remote chance of a vaccine causing a serious  injury or death.  The safety of vaccines is always being monitored. For more information, visit: www.cdc.gov/vaccinesafety/  5. What if there is a serious problem?  What should I look for?  · Look for anything that concerns you, such as signs of a severe allergic reaction, very high fever, or unusual behavior.  Signs of a severe allergic reaction can include hives, swelling of the face and throat, difficulty breathing, a fast heartbeat, dizziness, and weakness. These would usually start a few minutes to a few hours after the vaccination.  What should I do?  · If you think it is a severe allergic reaction or other emergency that can't wait, call 9-1-1 or get the person to the nearest hospital. Otherwise,   call your doctor.  · Afterward, the reaction should be reported to the Vaccine Adverse Event Reporting System (VAERS). Your doctor might file this report, or you can do it yourself through the VAERS web site at www.vaers.hhs.gov, or by calling 1-800-822-7967.  VAERS does not give medical advice.  6. The National Vaccine Injury Compensation Program  The National Vaccine Injury Compensation Program (VICP) is a federal program that was created to compensate people who may have been injured by certain vaccines.  Persons who believe they may have been injured by a vaccine can learn about the program and about filing a claim by calling 1-800-338-2382 or visiting the VICP website at www.hrsa.gov/vaccinecompensation. There is a time limit to file a claim for compensation.  7. How can I learn more?  · Ask your doctor. He or she can give you the vaccine package insert or suggest other sources of information.  · Call your local or state health department.  · Contact the Centers for Disease Control and Prevention (CDC):  ? Call 1-800-232-4636 (1-800-CDC-INFO) or  ? Visit CDC's website at www.cdc.gov/vaccines  Vaccine Information Statement Tdap Vaccine (01/27/2014)  This information is not intended to replace advice given to you  by your health care provider. Make sure you discuss any questions you have with your health care provider.  Document Released: 05/21/2012 Document Revised: 07/08/2018 Document Reviewed: 07/08/2018  Elsevier Interactive Patient Education © 2019 Elsevier Inc.

## 2019-02-27 NOTE — Progress Notes (Signed)
   PRENATAL VISIT NOTE  Subjective:  Whitney Gonzalez is a 28 y.o. G4P0030 at [redacted]w[redacted]d being seen today for ongoing prenatal care.  She is currently monitored for the following issues for this low-risk pregnancy and has Bipolar I disorder ([place]); Subchorionic hematoma; Abdominal pain affecting pregnancy; Supervision of low-risk first pregnancy; History of maternal syphilis, currently pregnant; Abnormal Pap smear of cervix; and Round ligament pain on their problem list.  Patient reports no complaints. Occasional tightening in her abdomen.   Contractions: Not present. Vag. Bleeding: None.  Movement: Present. Denies leaking of fluid.   The following portions of the patient's history were reviewed and updated as appropriate: allergies, current medications, past family history, past medical history, past social history, past surgical history and problem list.   Objective:  There were no vitals filed for this visit.  Fetal Status:   Fundal Height: 27 cm Movement: Present     General:  Alert, oriented and cooperative. Patient is in no acute distress.  Skin: Skin is warm and dry. No rash noted.   Cardiovascular: Normal heart rate noted  Respiratory: Normal respiratory effort, no problems with respiration noted  Abdomen: Soft, gravid, appropriate for gestational age.  Pain/Pressure: Present     Pelvic: Cervical exam deferred        Extremities: Normal range of motion.  Edema: Trace  Mental Status: Normal mood and affect. Normal behavior. Normal judgment and thought content.   Assessment and Plan:  Pregnancy: [id number] at [redacted]w[redacted]d  1. Encounter for supervision of low-risk first pregnancy in second trimester  Telehealth visit today; doing well.  Return in 4 weeks for TDAP vaccine, and 2 hour glucola; she is already scheduled for this appointment.  Increase oral fluid    There are no diagnoses linked to this encounter. Preterm labor symptoms and general obstetric precautions including but not  limited to vaginal bleeding, contractions, leaking of fluid and fetal movement were reviewed in detail with the patient. Please refer to After Visit Summary for other counseling recommendations.   Return in about 4 weeks (around 03/27/2019).  Future Appointments  Date Time Provider [place]  03/27/2019  8:20 AM [facility]  03/27/2019  9:15 AM Rasch, Artist Pais, NP [facility] [place] [facility]    Noni Saupe, NP

## 2019-03-05 ENCOUNTER — Telehealth: Payer: Self-pay | Admitting: Nurse Practitioner

## 2019-03-05 ENCOUNTER — Other Ambulatory Visit: Payer: Self-pay | Admitting: *Deleted

## 2019-03-05 ENCOUNTER — Telehealth: Payer: Self-pay | Admitting: Clinical

## 2019-03-05 ENCOUNTER — Telehealth: Payer: Self-pay

## 2019-03-05 ENCOUNTER — Other Ambulatory Visit: Payer: Self-pay | Admitting: Nurse Practitioner

## 2019-03-05 DIAGNOSIS — F319 Bipolar disorder, unspecified: Secondary | ICD-10-CM

## 2019-03-05 NOTE — Telephone Encounter (Signed)
Attempt to call pt, at request of Earlie Server, NP: no answer, voicemail full; no message left.   Pt called back immediately, and was unaware her voicemail was full. Pt is informed that numbers starting with [phone] will generally be [place] numbers. Pt states she will check her voicemail today, and is aware that the front office of [facility] will call her from [phone] to schedule an appointment with Roselyn Reef, the [place]. Pt agrees to this plan.

## 2019-03-05 NOTE — Telephone Encounter (Signed)
Talked with client today after she spoke with Lowanda Foster.  She had an episode of intense emotion and crying on Sunday.  Knows that this type of event may trigger her depression and is somewhat concerned.  She sent a MyChart message to the office that was not returned, but today when the office called, she talked about this episode.  Is trying to be proactive and attentive to self care.  Is not currently on any psych meds and is not in therapy due to her insurance only covers pregnancy.    Advised her to try to facetime or Zoom with friends who are pregnant, not just text given that we are in a time of social distancing.  Also sent message to Roselyn Reef in the office to connect with her to review more strategies for management.  Advised walking on her street rather than going to a park (had to go back home quickly to void as she had been drinking lots of water).  Advised her to watch the pattern of her intense emotions.  Has been fine on Monday and Tuesday this week.  Today slept til 2 pm.  Diet recall is appropriate.  If emotions are worsening, medication can be considered but possibly other techniques can be used for now.  Earlie Server, RN, MSN, NP-BC Nurse Practitioner, [facility] for [employer], [facility] 03/05/2019 3:48 PM

## 2019-03-05 NOTE — Telephone Encounter (Signed)
Called Whitney Gonzalez to make sure that she was using her babys[employer]s app and taking her blood pressure, Whitney Gonzalez sated that she was taking her blood pressures but she couldn't log them into the app, changed her settings and she logged in and did a test run for me. Whitney Gonzalez also requested to speak with a provider based off of a message that she sent and never got a response to, advised her that she could speak to Earlie Server, NP today .

## 2019-03-21 ENCOUNTER — Telehealth: Payer: Self-pay | Admitting: Clinical

## 2019-03-21 NOTE — Telephone Encounter (Signed)
Attempt to follow up with patient: Voicemail is full; no message left. MyChart message sent.

## 2019-03-23 ENCOUNTER — Inpatient Hospital Stay (HOSPITAL_COMMUNITY)
Admission: AD | Admit: 2019-03-23 | Discharge: 2019-03-24 | Disposition: A | Discharge status: Home / Self Care | Payer: Medicaid Other | Source: Ambulatory Visit | Attending: Obstetrics & Gynecology | Admitting: Obstetrics & Gynecology

## 2019-03-23 ENCOUNTER — Other Ambulatory Visit: Payer: Self-pay

## 2019-03-23 DIAGNOSIS — R102 Pelvic and perineal pain: Secondary | ICD-10-CM | POA: Insufficient documentation

## 2019-03-23 DIAGNOSIS — R109 Unspecified abdominal pain: Secondary | ICD-10-CM

## 2019-03-23 DIAGNOSIS — G8929 Other chronic pain: Secondary | ICD-10-CM | POA: Insufficient documentation

## 2019-03-23 DIAGNOSIS — O99513 Diseases of the respiratory system complicating pregnancy, third trimester: Secondary | ICD-10-CM | POA: Insufficient documentation

## 2019-03-23 DIAGNOSIS — O26899 Other specified pregnancy related conditions, unspecified trimester: Secondary | ICD-10-CM

## 2019-03-23 DIAGNOSIS — J45909 Unspecified asthma, uncomplicated: Secondary | ICD-10-CM | POA: Insufficient documentation

## 2019-03-23 DIAGNOSIS — N189 Chronic kidney disease, unspecified: Secondary | ICD-10-CM | POA: Insufficient documentation

## 2019-03-23 DIAGNOSIS — O09899 Supervision of other high risk pregnancies, unspecified trimester: Secondary | ICD-10-CM

## 2019-03-23 DIAGNOSIS — M419 Scoliosis, unspecified: Secondary | ICD-10-CM | POA: Insufficient documentation

## 2019-03-23 DIAGNOSIS — Z3A29 29 weeks gestation of pregnancy: Secondary | ICD-10-CM | POA: Insufficient documentation

## 2019-03-23 DIAGNOSIS — O26833 Pregnancy related renal disease, third trimester: Secondary | ICD-10-CM | POA: Insufficient documentation

## 2019-03-23 DIAGNOSIS — Z3403 Encounter for supervision of normal first pregnancy, third trimester: Secondary | ICD-10-CM

## 2019-03-23 DIAGNOSIS — O26893 Other specified pregnancy related conditions, third trimester: Secondary | ICD-10-CM | POA: Insufficient documentation

## 2019-03-23 DIAGNOSIS — N949 Unspecified condition associated with female genital organs and menstrual cycle: Secondary | ICD-10-CM | POA: Insufficient documentation

## 2019-03-23 DIAGNOSIS — O99353 Diseases of the nervous system complicating pregnancy, third trimester: Secondary | ICD-10-CM | POA: Insufficient documentation

## 2019-03-23 NOTE — MAU Note (Signed)
Pt first started feeling vag pressure around 1800 and then began feeling a sharp pain in her lower left abd associated with braxton hick ctx. No vag leaking or bleeding. Took a warm bath that helped some of the pressure but after walking into the hospital, the pressure came back.

## 2019-03-24 ENCOUNTER — Other Ambulatory Visit: Payer: Self-pay

## 2019-03-24 ENCOUNTER — Encounter (HOSPITAL_COMMUNITY): Payer: Self-pay | Admitting: *Deleted

## 2019-03-24 ENCOUNTER — Telehealth: Payer: Self-pay | Admitting: Family Medicine

## 2019-03-24 DIAGNOSIS — O99513 Diseases of the respiratory system complicating pregnancy, third trimester: Secondary | ICD-10-CM | POA: Diagnosis not present

## 2019-03-24 DIAGNOSIS — N949 Unspecified condition associated with female genital organs and menstrual cycle: Secondary | ICD-10-CM | POA: Diagnosis not present

## 2019-03-24 DIAGNOSIS — J45909 Unspecified asthma, uncomplicated: Secondary | ICD-10-CM | POA: Diagnosis not present

## 2019-03-24 DIAGNOSIS — O26893 Other specified pregnancy related conditions, third trimester: Secondary | ICD-10-CM

## 2019-03-24 DIAGNOSIS — O99353 Diseases of the nervous system complicating pregnancy, third trimester: Secondary | ICD-10-CM | POA: Diagnosis not present

## 2019-03-24 DIAGNOSIS — N189 Chronic kidney disease, unspecified: Secondary | ICD-10-CM | POA: Diagnosis not present

## 2019-03-24 DIAGNOSIS — R102 Pelvic and perineal pain: Secondary | ICD-10-CM

## 2019-03-24 DIAGNOSIS — O26833 Pregnancy related renal disease, third trimester: Secondary | ICD-10-CM | POA: Diagnosis not present

## 2019-03-24 DIAGNOSIS — O26899 Other specified pregnancy related conditions, unspecified trimester: Secondary | ICD-10-CM | POA: Diagnosis present

## 2019-03-24 DIAGNOSIS — M419 Scoliosis, unspecified: Secondary | ICD-10-CM | POA: Diagnosis not present

## 2019-03-24 DIAGNOSIS — G8929 Other chronic pain: Secondary | ICD-10-CM | POA: Diagnosis not present

## 2019-03-24 DIAGNOSIS — Z3A29 29 weeks gestation of pregnancy: Secondary | ICD-10-CM

## 2019-03-24 LAB — GC/CHLAMYDIA PROBE AMP (~~LOC~~) NOT AT ARMC
Chlamydia: NEGATIVE
Neisseria Gonorrhea: NEGATIVE

## 2019-03-24 LAB — WET PREP, GENITAL
Clue Cells Wet Prep HPF POC: NONE SEEN
Sperm: NONE SEEN
Trich, Wet Prep: NONE SEEN
Yeast Wet Prep HPF POC: NONE SEEN

## 2019-03-24 MED ORDER — COMFORT FIT MATERNITY SUPP SM MISC: 1.0000 | Freq: Every day | 0 refills | Status: DC | PRN

## 2019-03-24 MED ORDER — ACETAMINOPHEN 500 MG PO TABS: 1000.0000 mg | ORAL_TABLET | Freq: Once | ORAL | Status: DC
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
## 2019-03-24 NOTE — Discharge Instructions (Signed)
Pick up maternity belt at: [facility] [address] [place], [place] [address]

## 2019-03-24 NOTE — Telephone Encounter (Signed)
The patient called in regards to the the missed called. Informed the patient of the upcoming appointment tomorrow at [facility].

## 2019-03-25 ENCOUNTER — Encounter: Payer: Self-pay | Admitting: Advanced Practice Midwife

## 2019-03-25 ENCOUNTER — Other Ambulatory Visit: Payer: Self-pay

## 2019-03-25 ENCOUNTER — Telehealth: Payer: Self-pay | Admitting: Obstetrics and Gynecology

## 2019-03-25 ENCOUNTER — Ambulatory Visit (INDEPENDENT_AMBULATORY_CARE_PROVIDER_SITE_OTHER): Payer: Medicaid Other | Admitting: Student

## 2019-03-25 DIAGNOSIS — Z3A30 30 weeks gestation of pregnancy: Secondary | ICD-10-CM

## 2019-03-25 DIAGNOSIS — Z3403 Encounter for supervision of normal first pregnancy, third trimester: Secondary | ICD-10-CM

## 2019-03-25 NOTE — Telephone Encounter (Signed)
Called the patient to inform of the upcoming appointment. The voicemail box is full and can not receive messages at this time.

## 2019-03-25 NOTE — Progress Notes (Signed)
Pt reports right breast lump that has been present but is painful now when she applies pressure to it. Pt reports clear nipple fluid that comes out looking milky and dries yellow on her bra which has been present for weeks and she has already been informed this is normal.  Pt concerned that her stomach measurements are "a week ahead"  Pt reports pain in occipital bone with corresponding headache and neck pain.  She reports this is different than her previous migraines.  The neck pain is worse right when waking (4/10) and gets better as the day progresses.  She reports that right now it just feels tense.  The occipital pain has been constant for 2 days and occurs when she touches the area and feels like she hit her head.  3/10.    Pt is using babyscripts and has her own BP cuff at home that she is using.

## 2019-03-25 NOTE — Patient Instructions (Signed)
Glucose Tolerance Test During Pregnancy Why am I having this test? The glucose tolerance test (GTT) is done to check how your body processes sugar (glucose). This is one of several tests used to diagnose diabetes that develops during pregnancy (gestational diabetes mellitus). Gestational diabetes is a temporary form of diabetes that some women develop during pregnancy. It usually occurs during the second trimester of pregnancy and goes away after delivery. Testing (screening) for gestational diabetes usually occurs between 24 and 28 weeks of pregnancy. You may have the GTT test after having a 1-hour glucose screening test if the results from that test indicate that you may have gestational diabetes. You may also have this test if:  You have a history of gestational diabetes.  You have a history of giving birth to very large babies or have experienced repeated fetal loss (stillbirth).  You have signs and symptoms of diabetes, such as: ? Changes in your vision. ? Tingling or numbness in your hands or feet. ? Changes in hunger, thirst, and urination that are not otherwise explained by your pregnancy. What is being tested? This test measures the amount of glucose in your blood at different times during a period of 3 hours. This indicates how well your body is able to process glucose. What kind of sample is taken?  Blood samples are required for this test. They are usually collected by inserting a needle into a blood vessel. How do I prepare for this test?  For 3 days before your test, eat normally. Have plenty of carbohydrate-rich foods.  Follow instructions from your health care provider about: ? Eating or drinking restrictions on the day of the test. You may be asked to not eat or drink anything other than water (fast) starting 8-10 hours before the test. ? Changing or stopping your regular medicines. Some medicines may interfere with this test. Tell a health care provider about:  All  medicines you are taking, including vitamins, herbs, eye drops, creams, and over-the-counter medicines.  Any blood disorders you have.  Any surgeries you have had.  Any medical conditions you have. What happens during the test? First, your blood glucose will be measured. This is referred to as your fasting blood glucose, since you fasted before the test. Then, you will drink a glucose solution that contains a certain amount of glucose. Your blood glucose will be measured again 1, 2, and 3 hours after drinking the solution. This test takes about 3 hours to complete. You will need to stay at the testing location during this time. During the testing period:  Do not eat or drink anything other than the glucose solution.  Do not exercise.  Do not use any products that contain nicotine or tobacco, such as cigarettes and e-cigarettes. If you need help stopping, ask your health care provider. The testing procedure may vary among health care providers and hospitals. How are the results reported? Your results will be reported as milligrams of glucose per deciliter of blood (mg/dL) or millimoles per liter (mmol/L). Your health care provider will compare your results to normal ranges that were established after testing a large group of people (reference ranges). Reference ranges may vary among labs and hospitals. For this test, common reference ranges are:  Fasting: less than 95-105 mg/dL (5.3-5.8 mmol/L).  1 hour after drinking glucose: less than 180-190 mg/dL (10.0-10.5 mmol/L).  2 hours after drinking glucose: less than 155-165 mg/dL (8.6-9.2 mmol/L).  3 hours after drinking glucose: 140-145 mg/dL (7.8-8.1 mmol/L). What do the   results mean? Results within reference ranges are considered normal, meaning that your glucose levels are well-controlled. If two or more of your blood glucose levels are high, you may be diagnosed with gestational diabetes. If only one level is high, your health care  provider may suggest repeat testing or other tests to confirm a diagnosis. Talk with your health care provider about what your results mean. Questions to ask your health care provider Ask your health care provider, or the department that is doing the test:  When will my results be ready?  How will I get my results?  What are my treatment options?  What other tests do I need?  What are my next steps? Summary  The glucose tolerance test (GTT) is one of several tests used to diagnose diabetes that develops during pregnancy (gestational diabetes mellitus). Gestational diabetes is a temporary form of diabetes that some women develop during pregnancy.  You may have the GTT test after having a 1-hour glucose screening test if the results from that test indicate that you may have gestational diabetes. You may also have this test if you have any symptoms or risk factors for gestational diabetes.  Talk with your health care provider about what your results mean. This information is not intended to replace advice given to you by your health care provider. Make sure you discuss any questions you have with your health care provider. Document Released: 05/21/2012 Document Revised: 07/02/2017 Document Reviewed: 07/02/2017 Elsevier Interactive Patient Education  2019 Elsevier Inc.  

## 2019-03-26 ENCOUNTER — Telehealth: Payer: Self-pay | Admitting: Obstetrics and Gynecology

## 2019-03-26 NOTE — Progress Notes (Signed)
Patient ID: Whitney Gonzalez, female   DOB: 1991/10/03, 28 y.o.   MRN: [id number]   PRENATAL VISIT NOTE  Subjective:  Whitney Gonzalez is a 28 y.o. G4P0030 at [redacted]w[redacted]d being seen today for ongoing prenatal care.  She is currently monitored for the following issues for this low-risk pregnancy and has Bipolar I disorder ([place]); Subchorionic hematoma; Abdominal pain affecting pregnancy; Supervision of low-risk first pregnancy; History of maternal syphilis, currently pregnant; Abnormal Pap smear of cervix; Round ligament pain; and Pelvic pressure in pregnancy on their problem list.  Patient reports many complaints. She is concerned about a tender spot in her upper right quadrant of her breast. She says that the pain has been there off and one throughout her life, even when not pregnant, and that she has had two mammograms that concluded it was normal thickened tissue. Her mother is a Press photographer and has been measuring her belly; her mother says that the baby is measuring bigger than it should. She also has a headache that continues to happen every morning and then gets better by the evening time. She denies blurry vision, visual changes.  Contractions: Irritability. Vag. Bleeding: None.  Movement: Present. Denies leaking of fluid.   Patient was also seen in MAU the prior day for contractions; cervix was closed and she was discharged home with RLP information and precautions.   The following portions of the patient's history were reviewed and updated as appropriate: allergies, current medications, past family history, past medical history, past social history, past surgical history and problem list.   Objective:   Vitals:   03/25/19 1628  BP: 120/78  Pulse: 92  Weight: 149 lb 8 oz (67.8 kg)    Fetal Status: Fetal Heart Rate (bpm): 141 Fundal Height: 31 cm Movement: Present     General:  Alert, oriented and cooperative. Patient is in no acute distress.  Skin: Skin is warm and dry. No rash  noted.  Right breast appears normal; outer upper quadrant has an area that is tender to palpation but no palpable masses or nodules.   Cardiovascular: Normal heart rate noted  Respiratory: Normal respiratory effort, no problems with respiration noted  Abdomen: Soft, gravid, appropriate for gestational age.  Pain/Pressure: Present     Pelvic: Cervical exam deferred        Extremities: Normal range of motion.  Edema: Trace  Mental Status: Normal mood and affect. Normal behavior. Normal judgment and thought content.   Assessment and Plan:  Pregnancy: [id number] at [redacted]w[redacted]d 1. Encounter for supervision of low-risk first pregnancy in third trimester -Reviewed mammograms from 2017 and 2018; both were benign. I explained to patient that she most likely is starting to produce milk/colostrum and that is why her breasts are becoming irritated, in addition to pregnancy hormones.  -FH measures appropriately; explained to patient how FH is measured and our guidelines.  -Emotional support/stress reduction discussed with patient. Patient is planning to see Roselyn Reef; she feels like she is not coping as well as she normally does and will see Roselyn Reef on Thursday.  -Discussed birth control. She does not want IUD; she has had the patch and nexplanon in the past and did not like. She wants micronor.  -Return to visit with Roselyn Reef and 2 hour gtt on Thursday.  -Reviewed BabyRx BP data; all values within normal range (120/70s).  -Patient downloaded WebEx on her phone and knows how to use it.   Preterm labor symptoms and general obstetric precautions including but not limited to  vaginal bleeding, contractions, leaking of fluid and fetal movement were reviewed in detail with the patient. Please refer to After Visit Summary for other counseling recommendations.   Return in about 3 weeks (around 04/15/2019), or weeks for Webex; 6 weeks for in person.  Future Appointments  Date Time Provider [place]  03/27/2019  8:20 AM  [facility]  03/27/2019 10:00 AM [place]  04/15/2019  3:15 PM Wende Mott, [place]    [place], [place]

## 2019-03-26 NOTE — Telephone Encounter (Signed)
Returned pt's call regarding pressure.  Pt reports vaginal pressure has increased and felt urge to push and to poop but reports nothing was coming out.  Pt reports she also is not feeling well, like she has no energy like she is anemic.  She reports she took her BP and it was 115/80 after she woke up from a nap which she reports is high diastolic for her.  Pt reports she has 1-2 BMs per day and has had a bowel movement today and after her bowel movement she continued to have the urge to push. Advised pt that at [redacted]w[redacted]d, the baby could have shifted into a lower position.  Advised pt not to strain to have a bowel movement and to rest as much as possible.  Instructed pt that if she beings to have contractions that accompany the pressure then she should go to MAU.  Pt verbalized understanding.

## 2019-03-27 ENCOUNTER — Other Ambulatory Visit: Payer: Medicaid Other

## 2019-03-27 ENCOUNTER — Other Ambulatory Visit: Payer: Self-pay | Admitting: General Practice

## 2019-03-27 ENCOUNTER — Institutional Professional Consult (permissible substitution): Payer: Self-pay

## 2019-03-27 ENCOUNTER — Other Ambulatory Visit: Payer: Self-pay

## 2019-03-27 DIAGNOSIS — Z3403 Encounter for supervision of normal first pregnancy, third trimester: Secondary | ICD-10-CM

## 2019-03-28 LAB — CBC
Hematocrit: 37.2 % (ref 34.0–46.6)
Hemoglobin: 12.4 g/dL (ref 11.1–15.9)
MCH: 31.8 pg (ref 26.6–33.0)
MCHC: 33.3 g/dL (ref 31.5–35.7)
MCV: 95 fL (ref 79–97)
Platelets: 215 10*3/uL (ref 150–450)
RBC: 3.9 x10E6/uL (ref 3.77–5.28)
RDW: 12.6 % (ref 11.7–15.4)
WBC: 7.8 10*3/uL (ref 3.4–10.8)

## 2019-03-28 LAB — GLUCOSE TOLERANCE, 2 HOURS W/ 1HR
Glucose, 1 hour: 128 mg/dL (ref 65–179)
Glucose, 2 hour: 88 mg/dL (ref 65–152)
Glucose, Fasting: 76 mg/dL (ref 65–91)

## 2019-03-28 LAB — RPR: RPR Ser Ql: NONREACTIVE

## 2019-03-28 LAB — HIV ANTIBODY (ROUTINE TESTING W REFLEX): HIV Screen 4th Generation wRfx: NONREACTIVE

## 2019-03-31 ENCOUNTER — Other Ambulatory Visit: Payer: Self-pay

## 2019-03-31 ENCOUNTER — Institutional Professional Consult (permissible substitution): Payer: Medicaid Other

## 2019-04-01 ENCOUNTER — Ambulatory Visit (INDEPENDENT_AMBULATORY_CARE_PROVIDER_SITE_OTHER): Payer: Medicaid Other | Admitting: Clinical

## 2019-04-01 ENCOUNTER — Other Ambulatory Visit: Payer: Self-pay

## 2019-04-01 DIAGNOSIS — F319 Bipolar disorder, unspecified: Secondary | ICD-10-CM | POA: Diagnosis present

## 2019-04-01 NOTE — BH Specialist Note (Signed)
[place] Initial Visit via Webex(video)  04/01/2019 Whitney Gonzalez [id number]   Session Start time: 3:00  Session End time: 3:59 Total time: 1 hour  Referring Provider: Earlie Server, NP Patient location: [facility] Provider location: [place] All persons participating in visit: [facility] Whitney Mixer, LCSW; [name]  Confirmed patient's address: Yes  Confirmed patient's phone number: Yes  Any changes to demographics: No   Confirmed patient's insurance: Yes  Any changes to patient's insurance: No   Discussed confidentiality: Yes    The following statements were read to the patient and/or legal guardian that are established with the [facility] Provider.  "The purpose of this video visit is to provide behavioral health care while limiting exposure to the coronavirus (COVID19).  There is a possibility of technology failure and discussed alternative modes of communication if that failure occurs."  "By engaging in this video visit, you consent to the provision of healthcare.  Additionally, you authorize for your insurance to be billed for the services provided during this video visit."   Patient and/or legal guardian consented to video visit: Yes   PRESENTING CONCERNS: Patient and/or family reports the following symptoms/concerns: Pt states her primary concern today is symptoms of anxiety; pt copes best using deep breathing exercises; open to learning additional self-coping strategy today.   Pt reports a history of bipolar affective disorder that began at [place]; was previously treated with Lamictal 5mg , then reduced to 2.5mg , as Lamictal increased her migraine days. Pt prefers using self-coping strategies; attributes escalation of anxiety to last trimester pregnancy discomfort and worry about COVID19 pandemic.  Duration of problem: Ongoing; Severity of problem: moderate  STRENGTHS (Protective Factors/Coping Skills): Strong support system,  numerous friends experiencing pregnancy together, has developed effective coping skills; awareness.   Type of Service: [place] Interpretor:No. Interpretor Name and Language: n/a   Warm Hand Off Completed.       OBJECTIVE: Mood: Normal and Affect: Appropriate Risk of harm to self or others: No plan to harm self or others  LIFE CONTEXT: Family and Social: Pt lives with 6 adults and 2 dogs; strong social support from both friends and family School/Work: - Self-Care: Recognizes a greater need for self-care during pregnancy Life Changes: Current pregnancy  GOALS ADDRESSED: Patient will: 1. Reduce symptoms of: anxiety and depression 2. Increase knowledge and/or ability of: self-management skills  3. Demonstrate ability to: Increase healthy adjustment to current life circumstances  INTERVENTIONS: Interventions utilized: Mindfulness or Psychologist, educational and Psychoeducation and/or Health Education  Standardized Assessments completed: GAD-7 and PHQ 9  ASSESSMENT: Patient currently experiencing Bipolar affective disorder, type 1, as previously diagnosed   Patient may benefit from psychoeducation and brief therapeutic interventions regarding coping with symptoms of anxiety and depression .  PLAN: 1. Follow up with behavioral health clinician on : Two weeks. Will further discuss psychiatry options at that time.  2. Behavioral recommendations:  -Continue using deep breathing exercises when feelings of anxiety escalates -Add CALM relaxation breathing exercise twice daily (morning; at bedtime). Set timer on phone at same time every morning and evening, as a reminder -Continue taking prenatal vitamin daily, as recommended by medical provider -Continue completing one enjoyable activity/per day 3. Referral(s): [place] (In Clinic) 4. "From scale of 1-10, how likely are you to follow plan?": 10  [name],  LCSW   Depression screen [facility] 2/9 01/30/2019 01/02/2019 12/23/2018 12/02/2018 11/15/2018  Decreased Interest 0 0 0 0 1  Down, Depressed, Hopeless  0 0 0 0 0  PHQ - 2 Score 0 0 0 0 1  Altered sleeping 1 1 1 1 1   Tired, decreased energy 1 1 1 2 1   Change in appetite 0 0 0 0 1  Feeling bad or failure about yourself  0 0 0 0 0  Trouble concentrating 0 0 0 0 0  Moving slowly or fidgety/restless 0 0 0 0 0  Suicidal thoughts - 0 0 0 0  PHQ-9 Score 2 2 2 3 4   Some encounter information is confidential and restricted. Go to Review Flowsheets activity to see all data.   GAD 7 : Generalized Anxiety Score 01/30/2019 01/02/2019 12/23/2018 12/02/2018  Nervous, Anxious, on Edge 1 1 1 1   Control/stop worrying 0 1 1 1   Worry too much - different things 1 1 1 1   Trouble relaxing 0 0 0 0  Restless 0 0 0 0  Easily annoyed or irritable 1 0 1 1  Afraid - awful might happen 0 0 0 0  Total GAD 7 Score 3 3 4 4   Some encounter information is confidential and restricted. Go to Review Flowsheets activity to see all data.

## 2019-04-02 ENCOUNTER — Telehealth (HOSPITAL_COMMUNITY): Payer: Self-pay | Admitting: Lactation Services

## 2019-04-02 NOTE — Telephone Encounter (Addendum)
Pt called and left message on nurse voice mail. She would like to get results from her glucose tolerance test. Routed to Clinical Pool for follow up.  Pt called at 2:45 pm on 4/29 and was informed her Glucose tolerance test is normal. Pt pleased to hear results. Pt was informed that typically patients are only called with abnormal results. Pt verbalized understanding.

## 2019-04-15 ENCOUNTER — Ambulatory Visit (INDEPENDENT_AMBULATORY_CARE_PROVIDER_SITE_OTHER): Payer: Medicaid Other

## 2019-04-15 ENCOUNTER — Encounter: Payer: Self-pay | Admitting: General Practice

## 2019-04-15 DIAGNOSIS — Z3A33 33 weeks gestation of pregnancy: Secondary | ICD-10-CM

## 2019-04-15 DIAGNOSIS — Z3403 Encounter for supervision of normal first pregnancy, third trimester: Secondary | ICD-10-CM

## 2019-04-15 NOTE — Progress Notes (Signed)
   TELEHEALTH VIRTUAL OBSTETRICS VISIT ENCOUNTER NOTE  I connected with Whitney Gonzalez on 04/15/19 at  3:15 PM EDT by WebEx at home and verified that I am speaking with the correct person using two identifiers.   I discussed the limitations, risks, security and privacy concerns of performing an evaluation and management service by telephone and the availability of in person appointments. I also discussed with the patient that there may be a patient responsible charge related to this service. The patient expressed understanding and agreed to proceed.  Subjective:  Whitney Gonzalez is a 28 y.o. G4P0030 at [redacted]w[redacted]d being followed for ongoing prenatal care.  She is currently monitored for the following issues for this low-risk pregnancy and has Bipolar I disorder ([place]); Subchorionic hematoma; Abdominal pain affecting pregnancy; Supervision of low-risk first pregnancy; History of maternal syphilis, currently pregnant; Abnormal Pap smear of cervix; Round ligament pain; and Pelvic pressure in pregnancy on their problem list.  Patient reports no complaints. Reports fetal movement. Denies any contractions, bleeding or leaking of fluid.   The following portions of the patient's history were reviewed and updated as appropriate: allergies, current medications, past family history, past medical history, past social history, past surgical history and problem list.   Objective:   General:  Alert, oriented and cooperative.   Mental Status: Normal mood and affect perceived. Normal judgment and thought content.  Rest of physical exam deferred due to type of encounter  Assessment and Plan:  Pregnancy: G4P0030 at [redacted]w[redacted]d 1. Encounter for supervision of low-risk first pregnancy in third trimester -BP 116/83 -Patient reports contractions last night that have since resolved. Reviewed preterm labor precautions.  -Patient requesting COVID antibody testing. Discussed that it isn't offered at this time.  -Patient with many questions regarding vaccines. Resources given for [employer] and [employer]. Encouraged pateint to discuss with pediatrician of choice as well.   Preterm labor symptoms and general obstetric precautions including but not limited to vaginal bleeding, contractions, leaking of fluid and fetal movement were reviewed in detail with the patient.  I discussed the assessment and treatment plan with the patient. The patient was provided an opportunity to ask questions and all were answered. The patient agreed with the plan and demonstrated an understanding of the instructions. The patient was advised to call back or seek an in-person office evaluation/go to [facility] at [facility] for any urgent or concerning symptoms. Please refer to After Visit Summary for other counseling recommendations.   I provided 35 minutes of non-face-to-face time during this encounter.  Return in about 3 weeks (around 05/06/2019) for Return OB visit.  Future Appointments  Date Time Provider [place]  04/16/2019  3:30 PM [place]  05/08/2019 10:55 AM Emily Filbert, MD [place], [place] for [employer], [place]

## 2019-04-16 ENCOUNTER — Ambulatory Visit: Payer: Medicaid Other

## 2019-04-16 ENCOUNTER — Other Ambulatory Visit: Payer: Self-pay

## 2019-04-21 ENCOUNTER — Encounter (HOSPITAL_COMMUNITY): Payer: Self-pay

## 2019-04-21 ENCOUNTER — Other Ambulatory Visit: Payer: Self-pay

## 2019-04-21 ENCOUNTER — Telehealth: Payer: Self-pay

## 2019-04-21 ENCOUNTER — Inpatient Hospital Stay (HOSPITAL_COMMUNITY)
Admission: AD | Admit: 2019-04-21 | Discharge: 2019-04-21 | Disposition: A | Discharge status: Home / Self Care | Payer: Medicaid Other | Attending: Family Medicine | Admitting: Family Medicine

## 2019-04-21 DIAGNOSIS — O163 Unspecified maternal hypertension, third trimester: Secondary | ICD-10-CM | POA: Diagnosis not present

## 2019-04-21 DIAGNOSIS — Z3689 Encounter for other specified antenatal screening: Secondary | ICD-10-CM

## 2019-04-21 DIAGNOSIS — R030 Elevated blood-pressure reading, without diagnosis of hypertension: Secondary | ICD-10-CM | POA: Diagnosis present

## 2019-04-21 DIAGNOSIS — Z3403 Encounter for supervision of normal first pregnancy, third trimester: Secondary | ICD-10-CM

## 2019-04-21 DIAGNOSIS — O26893 Other specified pregnancy related conditions, third trimester: Secondary | ICD-10-CM

## 2019-04-21 DIAGNOSIS — R109 Unspecified abdominal pain: Secondary | ICD-10-CM

## 2019-04-21 DIAGNOSIS — Z3A33 33 weeks gestation of pregnancy: Secondary | ICD-10-CM | POA: Diagnosis not present

## 2019-04-21 DIAGNOSIS — O26899 Other specified pregnancy related conditions, unspecified trimester: Secondary | ICD-10-CM

## 2019-04-21 DIAGNOSIS — O09899 Supervision of other high risk pregnancies, unspecified trimester: Secondary | ICD-10-CM

## 2019-04-21 DIAGNOSIS — Z0371 Encounter for suspected problem with amniotic cavity and membrane ruled out: Secondary | ICD-10-CM

## 2019-04-21 DIAGNOSIS — O09893 Supervision of other high risk pregnancies, third trimester: Secondary | ICD-10-CM | POA: Diagnosis not present

## 2019-04-21 DIAGNOSIS — R102 Pelvic and perineal pain: Secondary | ICD-10-CM

## 2019-04-21 DIAGNOSIS — O169 Unspecified maternal hypertension, unspecified trimester: Secondary | ICD-10-CM

## 2019-04-21 LAB — COMPREHENSIVE METABOLIC PANEL
ALT: 14 U/L (ref 0–44)
AST: 16 U/L (ref 15–41)
Albumin: 2.8 g/dL — ABNORMAL LOW (ref 3.5–5.0)
Alkaline Phosphatase: 106 U/L (ref 38–126)
Anion gap: 11 (ref 5–15)
BUN: <5 mg/dL — ABNORMAL LOW (ref 6–20)
CO2: 21 mmol/L — ABNORMAL LOW (ref 22–32)
Calcium: 9.2 mg/dL (ref 8.9–10.3)
Chloride: 104 mmol/L (ref 98–111)
Creatinine, Ser: 0.48 mg/dL (ref 0.44–1.00)
GFR calc Af Amer: >60 mL/min (ref >60)
GFR calc non Af Amer: >60 mL/min (ref >60)
Glucose, Bld: 71 mg/dL (ref 70–99)
Potassium: 3.8 mmol/L (ref 3.5–5.1)
Sodium: 136 mmol/L (ref 135–145)
Total Bilirubin: 0.6 mg/dL (ref 0.3–1.2)
Total Protein: 6.2 g/dL — ABNORMAL LOW (ref 6.5–8.1)

## 2019-04-21 LAB — URINALYSIS, ROUTINE W REFLEX MICROSCOPIC
Bilirubin Urine: NEGATIVE
Glucose, UA: 50 mg/dL — AB
Hgb urine dipstick: NEGATIVE
Ketones, ur: NEGATIVE mg/dL
Leukocytes,Ua: NEGATIVE
Nitrite: NEGATIVE
Protein, ur: NEGATIVE mg/dL
Specific Gravity, Urine: 1.019 (ref 1.005–1.030)
pH: 6 (ref 5.0–8.0)

## 2019-04-21 LAB — PROTEIN / CREATININE RATIO, URINE
Creatinine, Urine: 189.62 mg/dL
Protein Creatinine Ratio: 0.08 mg/mg{Cre} (ref 0.00–0.15)
Total Protein, Urine: 15 mg/dL

## 2019-04-21 LAB — CBC
HCT: 35.6 % — ABNORMAL LOW (ref 36.0–46.0)
Hemoglobin: 12.4 g/dL (ref 12.0–15.0)
MCH: 32.1 pg (ref 26.0–34.0)
MCHC: 34.8 g/dL (ref 30.0–36.0)
MCV: 92.2 fL (ref 80.0–100.0)
Platelets: 194 10*3/uL (ref 150–400)
RBC: 3.86 MIL/uL — ABNORMAL LOW (ref 3.87–5.11)
RDW: 12.4 % (ref 11.5–15.5)
WBC: 7.5 10*3/uL (ref 4.0–10.5)
nRBC: 0 % (ref 0.0–0.2)

## 2019-04-21 LAB — AMNISURE RUPTURE OF MEMBRANE (ROM) NOT AT ARMC: Amnisure ROM: NEGATIVE

## 2019-04-21 MED ORDER — ACETAMINOPHEN 500 MG PO TABS: 1000.0000 mg | ORAL_TABLET | Freq: Once | ORAL | Status: DC

## 2019-04-21 NOTE — Telephone Encounter (Signed)
Patient states that she woke up with Dizziness a slight headache swelling in her eyes hand and feet, patient reports a blood pressure of 123/84 today, Per Dr.Dove patient needs to go to MAU for baseline labs and to be monitored for blood pressure. Pt verbalized understanding and stated she would head over there.

## 2019-04-21 NOTE — MAU Note (Signed)
Pt states she has high B/P,swelling in arms,legs, and eyes,diziness, H/A,cold sweats,nausea that started this weekend

## 2019-04-21 NOTE — Discharge Instructions (Signed)
Premature Rupture and Preterm Premature Rupture of Membranes  Rupture of membranes is when the membranes (amniotic sac) that hold your baby break open. This is commonly referred to as your "water breaking." If your water breaks before labor starts (prematurely), it is called premature rupture of membranes (PROM). If PROM occurs before 37 weeks of pregnancy, it is called preterm premature rupture of membranes (PPROM). Because the amniotic sac keeps infection out and performs other important functions, having the amniotic sac rupture before 37 weeks of pregnancy can lead to serious problems. It requires immediate attention from a health care provider. What are the causes? When PROM occurs at 37 weeks of pregnancy or later, it is usually caused by natural weakening of the membranes and friction caused by contractions. PPROM is usually caused by infection. In many cases, the cause is not known. What increases the risk of PPROM? The following factors may make you more likely to have PPROM:  Infection.  Having had PPROM in a previous pregnancy.  Short cervical length.  Bleeding during the second or third trimester.  Low BMI, which is an estimate of body fat.  Smoking.  Using drugs.  Low socioeconomic status. What problems can be caused by PROM and PPROM? This condition creates health dangers for the mother and the baby. These include:  Delivering a premature baby.  Getting a serious infection of the placental tissues (chorioamnionitis).  Early detachment of the placenta from the uterus (placental abruption).  Compression of the umbilical cord.  Developing a serious infection after delivery. What are the signs of PROM and PPROM? Signs of this condition include:  A sudden gush or slow leaking of fluid from the vagina.  Constant wet underwear. Sometimes, women mistake the leaking or wetness for urine, especially if the leak is slow and not a gush of fluid. If there is constant  leaking or if your underwear continues to get wet, your membranes have likely ruptured. What should I do if I think my membranes have ruptured?  Call your health care provider right away.  You will need to go to the hospital immediately to be checked by a health care provider. What happens if I am diagnosed with PROM or PPROM? Once you arrive at the hospital, you will have tests done. A cervical exam will be done using a lubricated instrument (speculum) to check whether the cervix has softened or started to open (dilate).  If you are diagnosed with PROM, your labor may be started for you (you may be induced) within 24 hours if you are not having contractions.  If you are diagnosed with PPROM and you are not having contractions, you may be induced depending on your trimester. If you have PPROM:  You and your baby will be monitored closely for signs of infection or other complications.  You may be given: ? An antibiotic medicine to lower the chances of developing an infection. ? A steroid medicine to help mature the baby's lungs more quickly. ? A medicine to help prevent cerebral palsy in your baby. ? A medicine to stop preterm labor.  You may be ordered to be on bed rest at home or in the hospital.  You may be induced if complications occur for you or the baby. Your treatment will depend on many factors, such as how many weeks you have been pregnant (how far along you are), the development of the baby, and other complications that may occur. This information is not intended to replace advice given to  you by your health care provider. Make sure you discuss any questions you have with your health care provider. Document Released: 11/20/2005 Document Revised: 07/06/2017 Document Reviewed: 06/26/2016 Elsevier Interactive Patient Education  2019 [employer] Preeclampsia and Eclampsia  Preeclampsia is a serious condition that may develop during pregnancy. It is also called toxemia of  pregnancy. This condition causes high blood pressure along with other symptoms, such as swelling and headaches. These symptoms may develop as the condition gets worse. Preeclampsia may occur at 20 weeks of pregnancy or later. Diagnosing and treating preeclampsia early is very important. If not treated early, it can cause serious problems for you and your baby. One problem it can lead to is eclampsia. Eclampsia is a condition that causes muscle jerking or shaking (convulsions or seizures) and other serious problems for the mother. During pregnancy, delivering your baby may be the best treatment for preeclampsia or eclampsia. For most women, preeclampsia and eclampsia symptoms go away after giving birth. In rare cases, a woman may develop preeclampsia after giving birth (postpartum preeclampsia). This usually occurs within 48 hours after childbirth but may occur up to 6 weeks after giving birth. What are the causes? The cause of preeclampsia is not known. What increases the risk? The following risk factors make you more likely to develop preeclampsia:  Being pregnant for the first time.  Having had preeclampsia during a past pregnancy.  Having a family history of preeclampsia.  Having high blood pressure.  Being pregnant with more than one baby.  Being 8 or older.  Being African-American.  Having kidney disease or diabetes.  Having medical conditions such as lupus or blood diseases.  Being very overweight (obese). What are the signs or symptoms? The earliest signs of preeclampsia are:  High blood pressure.  Increased protein in your urine. Your health care provider will check for this at every visit before you give birth (prenatal visit). Other symptoms that may develop as the condition gets worse include:  Severe headaches.  Sudden weight gain.  Swelling of the hands, face, legs, and feet.  Nausea and vomiting.  Vision problems, such as blurred or double vision.  Numbness  in the face, arms, legs, and feet.  Urinating less than usual.  Dizziness.  Slurred speech.  Abdominal pain, especially upper abdominal pain.  Convulsions or seizures. How is this diagnosed? There are no screening tests for preeclampsia. Your health care provider will ask you about symptoms and check for signs of preeclampsia during your prenatal visits. You may also have tests that include:  Urine tests.  Blood tests.  Checking your blood pressure.  Monitoring your babys heart rate.  Ultrasound. How is this treated? You and your health care provider will determine the treatment approach that is best for you. Treatment may include:  Having more frequent prenatal exams to check for signs of preeclampsia, if you have an increased risk for preeclampsia.  Medicine to lower your blood pressure.  Staying in the hospital, if your condition is severe. There, treatment will focus on controlling your blood pressure and the amount of fluids in your body (fluid retention).  Taking medicine (magnesium sulfate) to prevent seizures. This may be given as an injection or through an IV.  Taking a low-dose aspirin during your pregnancy.  Delivering your baby early, if your condition gets worse. You may have your labor started with medicine (induced), or you may have a cesarean delivery. Follow these instructions at home: Eating and drinking   Drink enough fluid  to keep your urine pale yellow.  Avoid caffeine. Lifestyle  Do not use any products that contain nicotine or tobacco, such as cigarettes and e-cigarettes. If you need help quitting, ask your health care provider.  Do not use alcohol or drugs.  Avoid stress as much as possible. Rest and get plenty of sleep. General instructions  Take over-the-counter and prescription medicines only as told by your health care provider.  When lying down, lie on your left side. This keeps pressure off your major blood vessels.  When sitting  or lying down, raise (elevate) your feet. Try putting some pillows underneath your lower legs.  Exercise regularly. Ask your health care provider what kinds of exercise are best for you.  Keep all follow-up and prenatal visits as told by your health care provider. This is important. How is this prevented? There is no known way of preventing preeclampsia or eclampsia from developing. However, to lower your risk of complications and detect problems early:  Get regular prenatal care. Your health care provider may be able to diagnose and treat the condition early.  Maintain a healthy weight. Ask your health care provider for help managing weight gain during pregnancy.  Work with your health care provider to manage any long-term (chronic) health conditions you have, such as diabetes or kidney problems.  You may have tests of your blood pressure and kidney function after giving birth.  Your health care provider may have you take low-dose aspirin during your next pregnancy. Contact a health care provider if:  You have symptoms that your health care provider told you may require more treatment or monitoring, such as: ? Headaches. ? Nausea or vomiting. ? Abdominal pain. ? Dizziness. ? Light-headedness. Get help right away if:  You have severe: ? Abdominal pain. ? Headaches that do not get better. ? Dizziness. ? Vision problems. ? Confusion. ? Nausea or vomiting.  You have any of the following: ? A seizure. ? Sudden, rapid weight gain. ? Sudden swelling in your hands, ankles, or face. ? Trouble moving any part of your body. ? Numbness in any part of your body. ? Trouble speaking. ? Abnormal bleeding.  You faint. Summary  Preeclampsia is a serious condition that may develop during pregnancy. It is also called toxemia of pregnancy.  This condition causes high blood pressure along with other symptoms, such as swelling and headaches.  Diagnosing and treating preeclampsia early is  very important. If not treated early, it can cause serious problems for you and your baby.  Get help right away if you have symptoms that your health care provider told you to watch for. This information is not intended to replace advice given to you by your health care provider. Make sure you discuss any questions you have with your health care provider. Document Released: 11/17/2000 Document Revised: 11/06/2017 Document Reviewed: 06/26/2016 Elsevier Interactive Patient Education  2019 [employer].

## 2019-04-21 NOTE — Telephone Encounter (Signed)
Called pt and LVM for patient to call the office back because babyscripts alerted 3 Blood pressures. 123/90 126/88 and 130/96.  Was checking to see if she was symptomatic and wanted her to repeat the BP for me.
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
## 2019-04-22 ENCOUNTER — Telehealth: Payer: Self-pay | Admitting: Student

## 2019-04-22 NOTE — Telephone Encounter (Signed)
The patient has some questions about her blood pressure. Stated she keeps getting headaches. Stated her blood pressure has been in 80s for the lower number. Stated she is drinking plenty of water.

## 2019-04-22 NOTE — Telephone Encounter (Signed)
Pt messaged via MyChart due to phone lines down.

## 2019-04-24 ENCOUNTER — Inpatient Hospital Stay (HOSPITAL_COMMUNITY)
Admission: AD | Admit: 2019-04-24 | Discharge: 2019-04-24 | Disposition: A | Discharge status: Home / Self Care | Payer: Medicaid Other | Attending: Family Medicine | Admitting: Family Medicine

## 2019-04-24 ENCOUNTER — Telehealth: Payer: Self-pay | Admitting: Obstetrics and Gynecology

## 2019-04-24 ENCOUNTER — Other Ambulatory Visit: Payer: Self-pay

## 2019-04-24 ENCOUNTER — Encounter (HOSPITAL_COMMUNITY): Payer: Self-pay | Admitting: *Deleted

## 2019-04-24 DIAGNOSIS — N189 Chronic kidney disease, unspecified: Secondary | ICD-10-CM | POA: Diagnosis not present

## 2019-04-24 DIAGNOSIS — F410 Panic disorder [episodic paroxysmal anxiety] without agoraphobia: Secondary | ICD-10-CM | POA: Insufficient documentation

## 2019-04-24 DIAGNOSIS — Z818 Family history of other mental and behavioral disorders: Secondary | ICD-10-CM | POA: Diagnosis not present

## 2019-04-24 DIAGNOSIS — Z8249 Family history of ischemic heart disease and other diseases of the circulatory system: Secondary | ICD-10-CM | POA: Diagnosis not present

## 2019-04-24 DIAGNOSIS — Z882 Allergy status to sulfonamides status: Secondary | ICD-10-CM | POA: Diagnosis not present

## 2019-04-24 DIAGNOSIS — Z833 Family history of diabetes mellitus: Secondary | ICD-10-CM | POA: Diagnosis not present

## 2019-04-24 DIAGNOSIS — Z809 Family history of malignant neoplasm, unspecified: Secondary | ICD-10-CM | POA: Insufficient documentation

## 2019-04-24 DIAGNOSIS — Z888 Allergy status to other drugs, medicaments and biological substances status: Secondary | ICD-10-CM | POA: Diagnosis not present

## 2019-04-24 DIAGNOSIS — F319 Bipolar disorder, unspecified: Secondary | ICD-10-CM | POA: Diagnosis not present

## 2019-04-24 DIAGNOSIS — O26893 Other specified pregnancy related conditions, third trimester: Secondary | ICD-10-CM | POA: Diagnosis not present

## 2019-04-24 DIAGNOSIS — O139 Gestational [pregnancy-induced] hypertension without significant proteinuria, unspecified trimester: Secondary | ICD-10-CM | POA: Insufficient documentation

## 2019-04-24 DIAGNOSIS — O99343 Other mental disorders complicating pregnancy, third trimester: Secondary | ICD-10-CM | POA: Insufficient documentation

## 2019-04-24 DIAGNOSIS — Z3A34 34 weeks gestation of pregnancy: Secondary | ICD-10-CM

## 2019-04-24 DIAGNOSIS — Z91018 Allergy to other foods: Secondary | ICD-10-CM | POA: Diagnosis not present

## 2019-04-24 DIAGNOSIS — Z91040 Latex allergy status: Secondary | ICD-10-CM | POA: Insufficient documentation

## 2019-04-24 DIAGNOSIS — I129 Hypertensive chronic kidney disease with stage 1 through stage 4 chronic kidney disease, or unspecified chronic kidney disease: Secondary | ICD-10-CM | POA: Insufficient documentation

## 2019-04-24 DIAGNOSIS — O133 Gestational [pregnancy-induced] hypertension without significant proteinuria, third trimester: Secondary | ICD-10-CM | POA: Diagnosis not present

## 2019-04-24 DIAGNOSIS — Z885 Allergy status to narcotic agent status: Secondary | ICD-10-CM | POA: Insufficient documentation

## 2019-04-24 DIAGNOSIS — Z3689 Encounter for other specified antenatal screening: Secondary | ICD-10-CM | POA: Insufficient documentation

## 2019-04-24 DIAGNOSIS — O10213 Pre-existing hypertensive chronic kidney disease complicating pregnancy, third trimester: Secondary | ICD-10-CM | POA: Insufficient documentation

## 2019-04-24 DIAGNOSIS — N898 Other specified noninflammatory disorders of vagina: Secondary | ICD-10-CM | POA: Diagnosis not present

## 2019-04-24 DIAGNOSIS — O9989 Other specified diseases and conditions complicating pregnancy, childbirth and the puerperium: Secondary | ICD-10-CM | POA: Diagnosis not present

## 2019-04-24 DIAGNOSIS — Z91013 Allergy to seafood: Secondary | ICD-10-CM | POA: Diagnosis not present

## 2019-04-24 DIAGNOSIS — Z880 Allergy status to penicillin: Secondary | ICD-10-CM | POA: Insufficient documentation

## 2019-04-24 DIAGNOSIS — Z881 Allergy status to other antibiotic agents status: Secondary | ICD-10-CM | POA: Diagnosis not present

## 2019-04-24 LAB — CBC
HCT: 35.7 % — ABNORMAL LOW (ref 36.0–46.0)
Hemoglobin: 12.4 g/dL (ref 12.0–15.0)
MCH: 32 pg (ref 26.0–34.0)
MCHC: 34.7 g/dL (ref 30.0–36.0)
MCV: 92 fL (ref 80.0–100.0)
Platelets: 189 10*3/uL (ref 150–400)
RBC: 3.88 MIL/uL (ref 3.87–5.11)
RDW: 12.4 % (ref 11.5–15.5)
WBC: 7.9 10*3/uL (ref 4.0–10.5)
nRBC: 0 % (ref 0.0–0.2)

## 2019-04-24 LAB — COMPREHENSIVE METABOLIC PANEL
ALT: 13 U/L (ref 0–44)
AST: 17 U/L (ref 15–41)
Albumin: 2.7 g/dL — ABNORMAL LOW (ref 3.5–5.0)
Alkaline Phosphatase: 113 U/L (ref 38–126)
Anion gap: 9 (ref 5–15)
BUN: <5 mg/dL — ABNORMAL LOW (ref 6–20)
CO2: 21 mmol/L — ABNORMAL LOW (ref 22–32)
Calcium: 9.1 mg/dL (ref 8.9–10.3)
Chloride: 108 mmol/L (ref 98–111)
Creatinine, Ser: 0.46 mg/dL (ref 0.44–1.00)
GFR calc Af Amer: >60 mL/min (ref >60)
GFR calc non Af Amer: >60 mL/min (ref >60)
Glucose, Bld: 88 mg/dL (ref 70–99)
Potassium: 4.2 mmol/L (ref 3.5–5.1)
Sodium: 138 mmol/L (ref 135–145)
Total Bilirubin: 0.3 mg/dL (ref 0.3–1.2)
Total Protein: 5.9 g/dL — ABNORMAL LOW (ref 6.5–8.1)

## 2019-04-24 LAB — URINALYSIS, ROUTINE W REFLEX MICROSCOPIC
Bilirubin Urine: NEGATIVE
Glucose, UA: NEGATIVE mg/dL
Hgb urine dipstick: NEGATIVE
Ketones, ur: NEGATIVE mg/dL
Leukocytes,Ua: NEGATIVE
Nitrite: NEGATIVE
Protein, ur: NEGATIVE mg/dL
Specific Gravity, Urine: 1.016 (ref 1.005–1.030)
pH: 6 (ref 5.0–8.0)

## 2019-04-24 LAB — AMNISURE RUPTURE OF MEMBRANE (ROM) NOT AT ARMC: Amnisure ROM: NEGATIVE

## 2019-04-24 LAB — POCT FERN TEST: POCT Fern Test: NEGATIVE

## 2019-04-24 LAB — PROTEIN / CREATININE RATIO, URINE
Creatinine, Urine: 147.33 mg/dL
Protein Creatinine Ratio: 0.07 mg/mg{Cre} (ref 0.00–0.15)
Total Protein, Urine: 11 mg/dL

## 2019-04-24 MED ORDER — ACETAMINOPHEN 500 MG PO TABS
1000.0000 mg | ORAL_TABLET | Freq: Four times a day (QID) | ORAL | Status: DC | PRN
  Administered 2019-04-24: 1000 mg via ORAL
  Filled 2019-04-24: qty 2

## 2019-04-24 NOTE — Telephone Encounter (Signed)
The patient stated was told to schedule an appointment. Informed of the in person visit and scheduled the NST/BPP.

## 2019-04-24 NOTE — Discharge Instructions (Signed)
Hypertension During Pregnancy ° °Hypertension is also called high blood pressure. High blood pressure means that the force of your blood moving in your body is too strong. When you are pregnant, this condition should be watched carefully. It can cause problems for you and your baby. °Follow these instructions at home: °Eating and drinking ° °· Drink enough fluid to keep your pee (urine) pale yellow. °· Avoid caffeine. °Lifestyle °· Do not use any products that contain nicotine or tobacco, such as cigarettes and e-cigarettes. If you need help quitting, ask your doctor. °· Do not use alcohol or drugs. °· Avoid stress. °· Rest and get plenty of sleep. °General instructions °· Take over-the-counter and prescription medicines only as told by your doctor. °· While lying down, lie on your left side. This keeps pressure off your major blood vessels. °· While sitting or lying down, raise (elevate) your feet. Try putting some pillows under your lower legs. °· Exercise regularly. Ask your doctor what kinds of exercise are best for you. °· Keep all prenatal and follow-up visits as told by your doctor. This is important. °Contact a doctor if: °· You have symptoms that your doctor told you to watch for, such as: °? Throwing up (vomiting). °? Feeling sick to your stomach (nausea). °? Headache. °Get help right away if you have: °· Very bad belly pain that does not get better with treatment. °· A very bad headache that does not get better. °· Throwing up that does not get better with treatment. °· Sudden, fast weight gain. °· Sudden swelling in your hands, ankles, or face. °· Bleeding from your vagina. °· Blood in your pee. °· Fewer movements from your baby than usual. °· Blurry vision. °· Double vision. °· Muscle twitching. °· Sudden muscle tightening (spasms). °· Trouble breathing. °· Blue fingernails or lips. °Summary °· Hypertension is also called high blood pressure. High blood pressure means that the force of your blood moving  in your body is too strong. °· When you are pregnant, this condition should be watched carefully. It can cause problems for you and your baby. °· Get help right away if you have symptoms that your doctor told you to watch for. °This information is not intended to replace advice given to you by your health care provider. Make sure you discuss any questions you have with your health care provider. °Document Released: 12/23/2010 Document Revised: 11/06/2017 Document Reviewed: 08/01/2016 °Elsevier Interactive Patient Education © 2019 [employer] ° °

## 2019-04-24 NOTE — MAU Note (Signed)
Pt reports she had some cramping when she woke up this morning. layed back down and then woke up again and felt like she had gush of fluid. Had a few more gushes since. Reports her stomach feels tight.
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
## 2019-05-01 ENCOUNTER — Encounter (HOSPITAL_COMMUNITY): Payer: Self-pay | Admitting: *Deleted

## 2019-05-01 ENCOUNTER — Inpatient Hospital Stay (HOSPITAL_COMMUNITY)
Admission: AD | Admit: 2019-05-01 | Discharge: 2019-05-02 | Disposition: A | Discharge status: Home / Self Care | Payer: Medicaid Other | Attending: Obstetrics and Gynecology | Admitting: Obstetrics and Gynecology

## 2019-05-01 ENCOUNTER — Ambulatory Visit (HOSPITAL_COMMUNITY)
Admission: RE | Admit: 2019-05-01 | Discharge: 2019-05-01 | Disposition: A | Discharge status: Home / Self Care | Payer: Medicaid Other | Source: Ambulatory Visit | Attending: Obstetrics and Gynecology | Admitting: Obstetrics and Gynecology

## 2019-05-01 ENCOUNTER — Ambulatory Visit (INDEPENDENT_AMBULATORY_CARE_PROVIDER_SITE_OTHER): Payer: Medicaid Other | Admitting: *Deleted

## 2019-05-01 ENCOUNTER — Other Ambulatory Visit: Payer: Self-pay

## 2019-05-01 VITALS — BP 115/83 | HR 94 | Wt 159.0 lb

## 2019-05-01 DIAGNOSIS — R102 Pelvic and perineal pain: Secondary | ICD-10-CM | POA: Diagnosis not present

## 2019-05-01 DIAGNOSIS — Z809 Family history of malignant neoplasm, unspecified: Secondary | ICD-10-CM | POA: Diagnosis not present

## 2019-05-01 DIAGNOSIS — Z362 Encounter for other antenatal screening follow-up: Secondary | ICD-10-CM

## 2019-05-01 DIAGNOSIS — Z818 Family history of other mental and behavioral disorders: Secondary | ICD-10-CM | POA: Insufficient documentation

## 2019-05-01 DIAGNOSIS — F419 Anxiety disorder, unspecified: Secondary | ICD-10-CM | POA: Insufficient documentation

## 2019-05-01 DIAGNOSIS — Z3A35 35 weeks gestation of pregnancy: Secondary | ICD-10-CM | POA: Diagnosis not present

## 2019-05-01 DIAGNOSIS — O99343 Other mental disorders complicating pregnancy, third trimester: Secondary | ICD-10-CM | POA: Insufficient documentation

## 2019-05-01 DIAGNOSIS — O368130 Decreased fetal movements, third trimester, not applicable or unspecified: Secondary | ICD-10-CM | POA: Diagnosis not present

## 2019-05-01 DIAGNOSIS — Z833 Family history of diabetes mellitus: Secondary | ICD-10-CM | POA: Diagnosis not present

## 2019-05-01 DIAGNOSIS — J45909 Unspecified asthma, uncomplicated: Secondary | ICD-10-CM | POA: Insufficient documentation

## 2019-05-01 DIAGNOSIS — Z880 Allergy status to penicillin: Secondary | ICD-10-CM | POA: Insufficient documentation

## 2019-05-01 DIAGNOSIS — O26899 Other specified pregnancy related conditions, unspecified trimester: Secondary | ICD-10-CM | POA: Diagnosis not present

## 2019-05-01 DIAGNOSIS — N189 Chronic kidney disease, unspecified: Secondary | ICD-10-CM | POA: Diagnosis not present

## 2019-05-01 DIAGNOSIS — Z91013 Allergy to seafood: Secondary | ICD-10-CM | POA: Insufficient documentation

## 2019-05-01 DIAGNOSIS — F319 Bipolar disorder, unspecified: Secondary | ICD-10-CM | POA: Insufficient documentation

## 2019-05-01 DIAGNOSIS — O133 Gestational [pregnancy-induced] hypertension without significant proteinuria, third trimester: Secondary | ICD-10-CM

## 2019-05-01 DIAGNOSIS — Z888 Allergy status to other drugs, medicaments and biological substances status: Secondary | ICD-10-CM | POA: Insufficient documentation

## 2019-05-01 DIAGNOSIS — Z91040 Latex allergy status: Secondary | ICD-10-CM | POA: Diagnosis not present

## 2019-05-01 DIAGNOSIS — Z91018 Allergy to other foods: Secondary | ICD-10-CM | POA: Insufficient documentation

## 2019-05-01 DIAGNOSIS — O26833 Pregnancy related renal disease, third trimester: Secondary | ICD-10-CM | POA: Insufficient documentation

## 2019-05-01 DIAGNOSIS — O4703 False labor before 37 completed weeks of gestation, third trimester: Secondary | ICD-10-CM | POA: Diagnosis not present

## 2019-05-01 DIAGNOSIS — O99513 Diseases of the respiratory system complicating pregnancy, third trimester: Secondary | ICD-10-CM | POA: Insufficient documentation

## 2019-05-01 DIAGNOSIS — Z3403 Encounter for supervision of normal first pregnancy, third trimester: Secondary | ICD-10-CM

## 2019-05-01 DIAGNOSIS — Z881 Allergy status to other antibiotic agents status: Secondary | ICD-10-CM | POA: Insufficient documentation

## 2019-05-01 DIAGNOSIS — Z885 Allergy status to narcotic agent status: Secondary | ICD-10-CM | POA: Insufficient documentation

## 2019-05-01 DIAGNOSIS — O09899 Supervision of other high risk pregnancies, unspecified trimester: Secondary | ICD-10-CM

## 2019-05-01 LAB — URINALYSIS, ROUTINE W REFLEX MICROSCOPIC
Bilirubin Urine: NEGATIVE
Glucose, UA: NEGATIVE mg/dL
Hgb urine dipstick: NEGATIVE
Ketones, ur: NEGATIVE mg/dL
Leukocytes,Ua: NEGATIVE
Nitrite: NEGATIVE
Protein, ur: NEGATIVE mg/dL
Specific Gravity, Urine: 1.005 (ref 1.005–1.030)
pH: 6 (ref 5.0–8.0)

## 2019-05-01 LAB — POCT URINALYSIS DIP (DEVICE)
Bilirubin Urine: NEGATIVE
Glucose, UA: NEGATIVE mg/dL
Hgb urine dipstick: NEGATIVE
Ketones, ur: NEGATIVE mg/dL
Leukocytes,Ua: NEGATIVE
Nitrite: NEGATIVE
Protein, ur: NEGATIVE mg/dL
Specific Gravity, Urine: 1.02 (ref 1.005–1.030)
Urobilinogen, UA: 0.2 mg/dL (ref 0.0–1.0)
pH: 7 (ref 5.0–8.0)

## 2019-05-01 LAB — WET PREP, GENITAL
Clue Cells Wet Prep HPF POC: NONE SEEN
Sperm: NONE SEEN
Trich, Wet Prep: NONE SEEN
Yeast Wet Prep HPF POC: NONE SEEN

## 2019-05-01 LAB — POCT FERN TEST: POCT Fern Test: NEGATIVE

## 2019-05-01 MED ORDER — LACTATED RINGERS IV BOLUS
1000.0000 mL | Freq: Once | INTRAVENOUS | Status: AC
  Administered 2019-05-01: 1000 mL via INTRAVENOUS

## 2019-05-01 MED ORDER — NIFEDIPINE 10 MG PO CAPS: 10.0000 mg | ORAL_CAPSULE | ORAL | Status: DC | PRN

## 2019-05-01 MED ORDER — NIFEDIPINE 10 MG PO CAPS
10.0000 mg | ORAL_CAPSULE | ORAL | Status: DC | PRN
  Administered 2019-05-01: 10 mg via ORAL
  Filled 2019-05-01: qty 1

## 2019-05-01 MED ORDER — CYCLOBENZAPRINE HCL 10 MG PO TABS
10.0000 mg | ORAL_TABLET | Freq: Three times a day (TID) | ORAL | Status: DC | PRN
  Administered 2019-05-01: 10 mg via ORAL
  Filled 2019-05-01: qty 1

## 2019-05-01 MED ORDER — ACETAMINOPHEN 500 MG PO TABS
1000.0000 mg | ORAL_TABLET | Freq: Once | ORAL | Status: AC
  Administered 2019-05-01: 1000 mg via ORAL
  Filled 2019-05-01: qty 2

## 2019-05-01 NOTE — Progress Notes (Signed)
Pt is scheduled for [place] growth @ MFM later today, BPP added. Pt states she is feeling very exhausted and has history of anemia. She requests testing for anemia today. Per chart review, pt had normal CBC on 5/21. She was informed of this result and no further testing is indicated @ this time. If desired, she may begin taking OTC Ferrous sulfate 325 mg once daily. Add OTC Colace 100 mg 1-2 tabs daily for constipation if needed. Pt voiced understanding of all information and instructions given.

## 2019-05-01 NOTE — Progress Notes (Signed)
I have reviewed this chart and agree with the RN/CMA assessment and management.   Reactive NST.  Feliz Beam, M.D. Attending Center for [employer] [facility])

## 2019-05-01 NOTE — MAU Note (Signed)
BAby active and pt feeling FM

## 2019-05-01 NOTE — Discharge Instructions (Signed)

## 2019-05-01 NOTE — MAU Note (Signed)
Had NST today due to Gest HTN and was having some ctxs. Tonight ctxs 2-44mins apart. Has noticed decreased FM since ctxs have gotten closer. Denies bleeding or LOF. Feels ctxs in back and pelvis
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
## 2019-05-02 DIAGNOSIS — Z3A35 35 weeks gestation of pregnancy: Secondary | ICD-10-CM

## 2019-05-02 DIAGNOSIS — R102 Pelvic and perineal pain: Secondary | ICD-10-CM

## 2019-05-02 DIAGNOSIS — O26899 Other specified pregnancy related conditions, unspecified trimester: Secondary | ICD-10-CM

## 2019-05-02 DIAGNOSIS — O368130 Decreased fetal movements, third trimester, not applicable or unspecified: Secondary | ICD-10-CM

## 2019-05-02 DIAGNOSIS — O133 Gestational [pregnancy-induced] hypertension without significant proteinuria, third trimester: Secondary | ICD-10-CM

## 2019-05-02 DIAGNOSIS — O4703 False labor before 37 completed weeks of gestation, third trimester: Secondary | ICD-10-CM

## 2019-05-05 ENCOUNTER — Telehealth: Payer: Self-pay | Admitting: Advanced Practice Midwife

## 2019-05-05 NOTE — Telephone Encounter (Signed)
The patient called stating she has some concerns. She is experiencing pain in her cervix. She stated she would like to speak with a nurse. She stated she visited the ER recently and was given medication to stop misarranging. However she stated she would like to speak with a nurse. Informed the patient of the message being sent to the nurse.

## 2019-05-07 ENCOUNTER — Telehealth: Payer: Self-pay | Admitting: Nurse Practitioner

## 2019-05-07 ENCOUNTER — Telehealth: Payer: Self-pay | Admitting: Emergency Medicine

## 2019-05-07 NOTE — Telephone Encounter (Signed)
Called the patient to confirm the appointment. Changed providers as the provider in office is virtual only. Also screened the patient, informed of wearing a mask, and no visitors due to [place]

## 2019-05-07 NOTE — Telephone Encounter (Signed)
Pt called the front office with complaints of swelling on her right side. Pt reports taking her blood pressure as directed and having normal results. Pt denies headache and dizziness at the time of the call. Pt was advised to keep appointment for 6/4 to be evaluated in person. Pt was also instructed to keep legs elevated when able to. Pt verbalized understanding and had no further questions or concerns at this time.

## 2019-05-08 ENCOUNTER — Other Ambulatory Visit: Payer: Self-pay

## 2019-05-08 ENCOUNTER — Encounter: Payer: Medicaid Other | Admitting: Obstetrics & Gynecology

## 2019-05-08 ENCOUNTER — Ambulatory Visit (INDEPENDENT_AMBULATORY_CARE_PROVIDER_SITE_OTHER): Payer: Medicaid Other | Admitting: Obstetrics & Gynecology

## 2019-05-08 ENCOUNTER — Other Ambulatory Visit (HOSPITAL_COMMUNITY)
Admission: RE | Admit: 2019-05-08 | Discharge: 2019-05-08 | Disposition: A | Discharge status: Home / Self Care | Payer: Medicaid Other | Source: Ambulatory Visit | Attending: Obstetrics & Gynecology | Admitting: Obstetrics & Gynecology

## 2019-05-08 ENCOUNTER — Ambulatory Visit: Payer: Self-pay

## 2019-05-08 ENCOUNTER — Ambulatory Visit (INDEPENDENT_AMBULATORY_CARE_PROVIDER_SITE_OTHER): Payer: Medicaid Other | Admitting: *Deleted

## 2019-05-08 ENCOUNTER — Encounter: Payer: Medicaid Other | Admitting: Obstetrics and Gynecology

## 2019-05-08 VITALS — BP 127/85 | HR 94 | Wt 160.2 lb

## 2019-05-08 DIAGNOSIS — O0993 Supervision of high risk pregnancy, unspecified, third trimester: Secondary | ICD-10-CM | POA: Diagnosis not present

## 2019-05-08 DIAGNOSIS — Z3A36 36 weeks gestation of pregnancy: Secondary | ICD-10-CM

## 2019-05-08 DIAGNOSIS — O133 Gestational [pregnancy-induced] hypertension without significant proteinuria, third trimester: Secondary | ICD-10-CM

## 2019-05-08 DIAGNOSIS — Z3403 Encounter for supervision of normal first pregnancy, third trimester: Secondary | ICD-10-CM

## 2019-05-08 LAB — OB RESULTS CONSOLE GBS: GBS: NEGATIVE

## 2019-05-08 LAB — POCT URINALYSIS DIP (DEVICE)
Bilirubin Urine: NEGATIVE
Glucose, UA: NEGATIVE mg/dL
Hgb urine dipstick: NEGATIVE
Ketones, ur: NEGATIVE mg/dL
Nitrite: NEGATIVE
Protein, ur: NEGATIVE mg/dL
Specific Gravity, Urine: 1.02 (ref 1.005–1.030)
Urobilinogen, UA: 0.2 mg/dL (ref 0.0–1.0)
pH: 6.5 (ref 5.0–8.0)

## 2019-05-08 NOTE — Progress Notes (Signed)
   PRENATAL VISIT NOTE  Subjective:  Whitney Gonzalez is a 28 y.o. G4P0030 at [redacted]w[redacted]d being seen today for ongoing prenatal care.  She is currently monitored for the following issues for this high-risk pregnancy and has Bipolar I disorder ([place]); Subchorionic hematoma; Abdominal pain affecting pregnancy; Supervision of low-risk first pregnancy; History of maternal syphilis, currently pregnant; Abnormal Pap smear of cervix; Round ligament pain; Pelvic pressure in pregnancy; and Gestational hypertension on their problem list.  Patient reports mild edema.  Contractions: Irregular. Vag. Bleeding: None.  Movement: Present. Denies leaking of fluid.   The following portions of the patient's history were reviewed and updated as appropriate: allergies, current medications, past family history, past medical history, past social history, past surgical history and problem list.   Objective:   Vitals:   05/08/19 0919  BP: 127/85  Pulse: 94  Weight: 160 lb 3.2 oz (72.7 kg)    Fetal Status: Fetal Heart Rate (bpm): NST   Movement: Present     General:  Alert, oriented and cooperative. Patient is in no acute distress.  Skin: Skin is warm and dry. No rash noted.   Cardiovascular: Normal heart rate noted  Respiratory: Normal respiratory effort, no problems with respiration noted  Abdomen: Soft, gravid, appropriate for gestational age.  Pain/Pressure: Present     Pelvic: Cervical exam performed Dilation: Closed Effacement (%): 30 Station: Ballotable  Extremities: Normal range of motion.  Edema: Trace  Mental Status: Normal mood and affect. Normal behavior. Normal judgment and thought content.   Assessment and Plan:  Pregnancy: [id number] at [redacted]w[redacted]d 1. Supervision of high risk pregnancy in third trimester Routine 36 week - GC/Chlamydia probe amp ([place])not at [facility] - Strep Gp B Culture+Rflx  2. Gestational hypertension, third trimester Possibly borderline CHTN, BP mostly <140/90 with nl labs   Preterm labor symptoms and general obstetric precautions including but not limited to vaginal bleeding, contractions, leaking of fluid and fetal movement were reviewed in detail with the patient. Please refer to After Visit Summary for other counseling recommendations.   No follow-ups on file.  No future appointments.  Emeterio Reeve, MD

## 2019-05-08 NOTE — Progress Notes (Signed)
Pt had MAU visit last week due to UC's - was given medication to stop them. She reports increased swelling of her hands particularly in the morning - difficult to move fingers.   Pt informed that the ultrasound is considered a limited OB ultrasound and is not intended to be a complete ultrasound exam.  Patient also informed that the ultrasound is not being completed with the intent of assessing for fetal or placental anomalies or any pelvic abnormalities.  Explained that the purpose of today's ultrasound is to assess for presentation, BPP and amniotic fluid volume.  Patient acknowledges the purpose of the exam and the limitations of the study.

## 2019-05-08 NOTE — Patient Instructions (Signed)

## 2019-05-09 LAB — GC/CHLAMYDIA PROBE AMP (~~LOC~~) NOT AT ARMC
Chlamydia: NEGATIVE
Neisseria Gonorrhea: NEGATIVE

## 2019-05-12 ENCOUNTER — Inpatient Hospital Stay (HOSPITAL_COMMUNITY)
Admission: AD | Admit: 2019-05-12 | Discharge: 2019-05-16 | DRG: 807 | Disposition: A | Discharge status: Home / Self Care | Payer: Medicaid Other | Attending: Obstetrics and Gynecology | Admitting: Obstetrics and Gynecology

## 2019-05-12 ENCOUNTER — Other Ambulatory Visit: Payer: Self-pay

## 2019-05-12 ENCOUNTER — Inpatient Hospital Stay (EMERGENCY_DEPARTMENT_HOSPITAL)
Admission: AD | Admit: 2019-05-12 | Discharge: 2019-05-12 | Disposition: A | Discharge status: Home / Self Care | Payer: Medicaid Other | Source: Home / Self Care | Attending: Obstetrics & Gynecology | Admitting: Obstetrics & Gynecology

## 2019-05-12 ENCOUNTER — Telehealth: Payer: Self-pay

## 2019-05-12 ENCOUNTER — Telehealth: Payer: Self-pay | Admitting: Family Medicine

## 2019-05-12 ENCOUNTER — Other Ambulatory Visit: Payer: Self-pay | Admitting: Advanced Practice Midwife

## 2019-05-12 ENCOUNTER — Encounter (HOSPITAL_COMMUNITY): Payer: Self-pay

## 2019-05-12 DIAGNOSIS — O99344 Other mental disorders complicating childbirth: Secondary | ICD-10-CM | POA: Diagnosis present

## 2019-05-12 DIAGNOSIS — O418X30 Other specified disorders of amniotic fluid and membranes, third trimester, not applicable or unspecified: Secondary | ICD-10-CM

## 2019-05-12 DIAGNOSIS — Z1159 Encounter for screening for other viral diseases: Secondary | ICD-10-CM

## 2019-05-12 DIAGNOSIS — O9952 Diseases of the respiratory system complicating childbirth: Secondary | ICD-10-CM | POA: Diagnosis present

## 2019-05-12 DIAGNOSIS — Z3A37 37 weeks gestation of pregnancy: Secondary | ICD-10-CM | POA: Diagnosis not present

## 2019-05-12 DIAGNOSIS — J45909 Unspecified asthma, uncomplicated: Secondary | ICD-10-CM | POA: Diagnosis present

## 2019-05-12 DIAGNOSIS — O134 Gestational [pregnancy-induced] hypertension without significant proteinuria, complicating childbirth: Principal | ICD-10-CM | POA: Diagnosis present

## 2019-05-12 DIAGNOSIS — F319 Bipolar disorder, unspecified: Secondary | ICD-10-CM | POA: Diagnosis present

## 2019-05-12 DIAGNOSIS — O133 Gestational [pregnancy-induced] hypertension without significant proteinuria, third trimester: Secondary | ICD-10-CM

## 2019-05-12 DIAGNOSIS — Z3A36 36 weeks gestation of pregnancy: Secondary | ICD-10-CM | POA: Insufficient documentation

## 2019-05-12 DIAGNOSIS — O139 Gestational [pregnancy-induced] hypertension without significant proteinuria, unspecified trimester: Secondary | ICD-10-CM | POA: Diagnosis present

## 2019-05-12 DIAGNOSIS — Z3403 Encounter for supervision of normal first pregnancy, third trimester: Secondary | ICD-10-CM

## 2019-05-12 DIAGNOSIS — O418X90 Other specified disorders of amniotic fluid and membranes, unspecified trimester, not applicable or unspecified: Secondary | ICD-10-CM | POA: Diagnosis present

## 2019-05-12 DIAGNOSIS — E039 Hypothyroidism, unspecified: Secondary | ICD-10-CM | POA: Diagnosis present

## 2019-05-12 DIAGNOSIS — K219 Gastro-esophageal reflux disease without esophagitis: Secondary | ICD-10-CM | POA: Diagnosis present

## 2019-05-12 DIAGNOSIS — O9962 Diseases of the digestive system complicating childbirth: Secondary | ICD-10-CM | POA: Diagnosis present

## 2019-05-12 DIAGNOSIS — O26899 Other specified pregnancy related conditions, unspecified trimester: Secondary | ICD-10-CM

## 2019-05-12 DIAGNOSIS — O09899 Supervision of other high risk pregnancies, unspecified trimester: Secondary | ICD-10-CM

## 2019-05-12 LAB — TYPE AND SCREEN
ABO/RH(D): O POS
Antibody Screen: NEGATIVE

## 2019-05-12 LAB — CBC WITH DIFFERENTIAL/PLATELET
Abs Immature Granulocytes: 0.06 10*3/uL (ref 0.00–0.07)
Basophils Absolute: 0 10*3/uL (ref 0.0–0.1)
Basophils Relative: 0 %
Eosinophils Absolute: 0.1 10*3/uL (ref 0.0–0.5)
Eosinophils Relative: 1 %
HCT: 36.6 % (ref 36.0–46.0)
Hemoglobin: 12.8 g/dL (ref 12.0–15.0)
Immature Granulocytes: 1 %
Lymphocytes Relative: 26 %
Lymphs Abs: 1.8 10*3/uL (ref 0.7–4.0)
MCH: 31.8 pg (ref 26.0–34.0)
MCHC: 35 g/dL (ref 30.0–36.0)
MCV: 91 fL (ref 80.0–100.0)
Monocytes Absolute: 0.9 10*3/uL (ref 0.1–1.0)
Monocytes Relative: 13 %
Neutro Abs: 3.9 10*3/uL (ref 1.7–7.7)
Neutrophils Relative %: 59 %
Platelets: 202 10*3/uL (ref 150–400)
RBC: 4.02 MIL/uL (ref 3.87–5.11)
RDW: 12.2 % (ref 11.5–15.5)
WBC: 6.7 10*3/uL (ref 4.0–10.5)
nRBC: 0 % (ref 0.0–0.2)

## 2019-05-12 LAB — COMPREHENSIVE METABOLIC PANEL
ALT: 15 U/L (ref 0–44)
AST: 18 U/L (ref 15–41)
Albumin: 2.7 g/dL — ABNORMAL LOW (ref 3.5–5.0)
Alkaline Phosphatase: 124 U/L (ref 38–126)
Anion gap: 8 (ref 5–15)
BUN: 7 mg/dL (ref 6–20)
CO2: 20 mmol/L — ABNORMAL LOW (ref 22–32)
Calcium: 9.1 mg/dL (ref 8.9–10.3)
Chloride: 108 mmol/L (ref 98–111)
Creatinine, Ser: 0.41 mg/dL — ABNORMAL LOW (ref 0.44–1.00)
GFR calc Af Amer: >60 mL/min (ref >60)
GFR calc non Af Amer: >60 mL/min (ref >60)
Glucose, Bld: 79 mg/dL (ref 70–99)
Potassium: 3.9 mmol/L (ref 3.5–5.1)
Sodium: 136 mmol/L (ref 135–145)
Total Bilirubin: 0.7 mg/dL (ref 0.3–1.2)
Total Protein: 6.1 g/dL — ABNORMAL LOW (ref 6.5–8.1)

## 2019-05-12 LAB — PROTEIN / CREATININE RATIO, URINE
Creatinine, Urine: 79.19 mg/dL
Total Protein, Urine: <6 mg/dL

## 2019-05-12 LAB — AMNISURE RUPTURE OF MEMBRANE (ROM) NOT AT ARMC: Amnisure ROM: NEGATIVE

## 2019-05-12 LAB — SARS CORONAVIRUS 2 BY RT PCR (HOSPITAL ORDER, PERFORMED IN ~~LOC~~ HOSPITAL LAB): SARS Coronavirus 2: NEGATIVE

## 2019-05-12 LAB — URINALYSIS, ROUTINE W REFLEX MICROSCOPIC
Bilirubin Urine: NEGATIVE
Glucose, UA: NEGATIVE mg/dL
Hgb urine dipstick: NEGATIVE
Ketones, ur: NEGATIVE mg/dL
Leukocytes,Ua: NEGATIVE
Nitrite: NEGATIVE
Protein, ur: NEGATIVE mg/dL
Specific Gravity, Urine: 1.011 (ref 1.005–1.030)
pH: 6 (ref 5.0–8.0)

## 2019-05-12 LAB — CBC
HCT: 37 % (ref 36.0–46.0)
Hemoglobin: 12.9 g/dL (ref 12.0–15.0)
MCH: 32.2 pg (ref 26.0–34.0)
MCHC: 34.9 g/dL (ref 30.0–36.0)
MCV: 92.3 fL (ref 80.0–100.0)
Platelets: 212 10*3/uL (ref 150–400)
RBC: 4.01 MIL/uL (ref 3.87–5.11)
RDW: 12.3 % (ref 11.5–15.5)
WBC: 11.1 10*3/uL — ABNORMAL HIGH (ref 4.0–10.5)
nRBC: 0 % (ref 0.0–0.2)

## 2019-05-12 LAB — POCT FERN TEST: POCT Fern Test: NEGATIVE

## 2019-05-12 LAB — STREP GP B CULTURE+RFLX: Strep Gp B Culture+Rflx: NEGATIVE

## 2019-05-12 MED ORDER — OXYCODONE-ACETAMINOPHEN 5-325 MG PO TABS: 2.0000 | ORAL_TABLET | ORAL | Status: DC | PRN

## 2019-05-12 MED ORDER — OXYTOCIN BOLUS FROM INFUSION
500.0000 mL | Freq: Once | INTRAVENOUS | Status: AC
  Administered 2019-05-14: 500 mL via INTRAVENOUS

## 2019-05-12 MED ORDER — ACETAMINOPHEN 325 MG PO TABS
650.0000 mg | ORAL_TABLET | ORAL | Status: DC | PRN
  Administered 2019-05-13 (×2): 650 mg via ORAL
  Filled 2019-05-12 (×2): qty 2

## 2019-05-12 MED ORDER — OXYCODONE-ACETAMINOPHEN 5-325 MG PO TABS: 1.0000 | ORAL_TABLET | ORAL | Status: DC | PRN

## 2019-05-12 MED ORDER — CYCLOBENZAPRINE HCL 10 MG PO TABS
10.0000 mg | ORAL_TABLET | Freq: Once | ORAL | Status: AC
  Administered 2019-05-12: 10 mg via ORAL
  Filled 2019-05-12: qty 1

## 2019-05-12 MED ORDER — ONDANSETRON HCL 4 MG/2ML IJ SOLN
4.0000 mg | Freq: Four times a day (QID) | INTRAMUSCULAR | Status: DC | PRN
  Administered 2019-05-13: 4 mg via INTRAVENOUS
  Filled 2019-05-12: qty 2

## 2019-05-12 MED ORDER — FENTANYL CITRATE (PF) 100 MCG/2ML IJ SOLN
100.0000 ug | INTRAMUSCULAR | Status: DC | PRN
  Administered 2019-05-12 – 2019-05-13 (×4): 100 ug via INTRAVENOUS
  Filled 2019-05-12 (×4): qty 2

## 2019-05-12 MED ORDER — SOD CITRATE-CITRIC ACID 500-334 MG/5ML PO SOLN: 30.0000 mL | ORAL | Status: DC | PRN

## 2019-05-12 MED ORDER — MISOPROSTOL 50MCG HALF TABLET
50.0000 ug | ORAL_TABLET | ORAL | Status: DC | PRN
  Administered 2019-05-12 – 2019-05-13 (×2): 50 ug via BUCCAL
  Filled 2019-05-12 (×2): qty 1

## 2019-05-12 MED ORDER — TERBUTALINE SULFATE 1 MG/ML IJ SOLN: 0.2500 mg | Freq: Once | INTRAMUSCULAR | Status: DC | PRN

## 2019-05-12 MED ORDER — LACTATED RINGERS IV SOLN
INTRAVENOUS | Status: DC
  Administered 2019-05-12 – 2019-05-13 (×6): via INTRAVENOUS

## 2019-05-12 MED ORDER — LACTATED RINGERS IV SOLN
500.0000 mL | INTRAVENOUS | Status: DC | PRN
  Administered 2019-05-13 (×2): 500 mL via INTRAVENOUS

## 2019-05-12 MED ORDER — OXYTOCIN 40 UNITS IN NORMAL SALINE INFUSION - SIMPLE MED: 2.5000 [IU]/h | INTRAVENOUS | Status: DC

## 2019-05-12 MED ORDER — LIDOCAINE HCL (PF) 1 % IJ SOLN: 30.0000 mL | INTRAMUSCULAR | Status: DC | PRN

## 2019-05-12 MED ORDER — OXYTOCIN 40 UNITS IN NORMAL SALINE INFUSION - SIMPLE MED: 1.0000 m[IU]/min | INTRAVENOUS | Status: DC
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
## 2019-05-12 NOTE — Telephone Encounter (Signed)
Patient was calling about GBS results. She is requesting a call back.

## 2019-05-12 NOTE — MAU Note (Signed)
Pt here with contractions and LOF. Reports she was here earlier today and had a balloon placed. Reports she started having contractions every 2 mins soon after that have not stopped. Reports she was sitting in the tub and felt like she had fluid come out and noticed more. Pt is not wearing a pad. Denies bleeding. Reports good fetal movement.
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
## 2019-05-12 NOTE — Telephone Encounter (Signed)
Received a call from [employer] for elevated Blood Pressures, they were 146/84 and retake was 132/96   Patient states she seeing spots in vision, and has a headache and is slightly lightheaded. States she's had 3 elevated blood pressures advised to go to MAU and we will follow up with her at a later date. Patient verbalized understanding and stated she would go to MAU when her mom gets home and ended the call.

## 2019-05-12 NOTE — MAU Note (Signed)
Pt. Reports right leg pain in her right thigh area since about 0900 today.  Pt reports elevated bp at home of 146/87 and 132/90.  Pt also reports a headache and seeing floaters.  Pt denies taking any pain medication today.  Pt also reports some ctx since signing into MAU.  Pt denies LOF and reports good fetal movement.

## 2019-05-12 NOTE — Progress Notes (Signed)
IOL orders placed.  SARS-Coronavirus test done on 05/12/2019 while in [place], CNM  05/12/19  2:56 PM

## 2019-05-12 NOTE — Discharge Instructions (Signed)
OUTPATIENT FOLEY BULB INDUCTION OF LABOR:  Information Sheet for Mothers and Family               What's a Foley Bulb Induction? A Foley bulb induction is a procedure where your provider inserts a catheter into your cervix. Once inside your womb, your provider inflates the balloon with a saline solution.   This puts pressure on your cervix and encourages dilation. The catheter falls out once your cervix dilates to 3-4 centimeters.     With any procedure, it's important that you know what to expect. The insertion of a Foley catheter can be a bit uncomfortable, and some women experience sharp pelvic pain. The pain may subside once the catheter is in place. You may experience some cramping when the Foley catheter is in place.  This is normal.     GO TO THE MATERNITY ADMISSIONS UNIT FOR THE FOLLOWING: Heavy vaginal bleeding Rupture of membranes (fluid that wets your underwear) Painful uterine contractions every 5 minutes or less Severe abdominal discomfort Decreased movement of the baby   

## 2019-05-13 ENCOUNTER — Telehealth: Payer: Self-pay

## 2019-05-13 ENCOUNTER — Encounter (HOSPITAL_COMMUNITY): Payer: Self-pay

## 2019-05-13 ENCOUNTER — Inpatient Hospital Stay (HOSPITAL_COMMUNITY): Payer: Medicaid Other | Admitting: Anesthesiology

## 2019-05-13 ENCOUNTER — Inpatient Hospital Stay (HOSPITAL_COMMUNITY): Payer: Medicaid Other

## 2019-05-13 DIAGNOSIS — O133 Gestational [pregnancy-induced] hypertension without significant proteinuria, third trimester: Secondary | ICD-10-CM | POA: Diagnosis not present

## 2019-05-13 LAB — CBC
HCT: 38.1 % (ref 36.0–46.0)
Hemoglobin: 13 g/dL (ref 12.0–15.0)
MCH: 31.6 pg (ref 26.0–34.0)
MCHC: 34.1 g/dL (ref 30.0–36.0)
MCV: 92.7 fL (ref 80.0–100.0)
Platelets: 185 10*3/uL (ref 150–400)
RBC: 4.11 MIL/uL (ref 3.87–5.11)
RDW: 12.5 % (ref 11.5–15.5)
WBC: 10.6 10*3/uL — ABNORMAL HIGH (ref 4.0–10.5)
nRBC: 0 % (ref 0.0–0.2)

## 2019-05-13 LAB — RPR: RPR Ser Ql: NONREACTIVE

## 2019-05-13 MED ORDER — LIDOCAINE HCL (PF) 1 % IJ SOLN
INTRAMUSCULAR | Status: DC | PRN
  Administered 2019-05-13 (×2): 5 mL via EPIDURAL

## 2019-05-13 MED ORDER — PHENYLEPHRINE 40 MCG/ML (10ML) SYRINGE FOR IV PUSH (FOR BLOOD PRESSURE SUPPORT)
80.0000 ug | PREFILLED_SYRINGE | INTRAVENOUS | Status: DC | PRN
  Filled 2019-05-13 (×2): qty 10

## 2019-05-13 MED ORDER — BUTORPHANOL TARTRATE 1 MG/ML IJ SOLN
2.0000 mg | INTRAMUSCULAR | Status: DC | PRN
  Administered 2019-05-13: 2 mg via INTRAVENOUS
  Filled 2019-05-13: qty 2

## 2019-05-13 MED ORDER — OXYTOCIN 40 UNITS IN NORMAL SALINE INFUSION - SIMPLE MED
1.0000 m[IU]/min | INTRAVENOUS | Status: DC
  Administered 2019-05-13: 2 m[IU]/min via INTRAVENOUS
  Filled 2019-05-13: qty 1000

## 2019-05-13 MED ORDER — EPHEDRINE 5 MG/ML INJ: 10.0000 mg | INTRAVENOUS | Status: DC | PRN

## 2019-05-13 MED ORDER — LACTATED RINGERS IV SOLN
500.0000 mL | Freq: Once | INTRAVENOUS | Status: AC
  Administered 2019-05-13: 500 mL via INTRAVENOUS

## 2019-05-13 MED ORDER — SODIUM CHLORIDE (PF) 0.9 % IJ SOLN
INTRAMUSCULAR | Status: DC | PRN
  Administered 2019-05-13: 14 mL/h via EPIDURAL

## 2019-05-13 MED ORDER — FENTANYL-BUPIVACAINE-NACL 0.5-0.125-0.9 MG/250ML-% EP SOLN
12.0000 mL/h | EPIDURAL | Status: DC | PRN
  Filled 2019-05-13 (×3): qty 250

## 2019-05-13 MED ORDER — MORPHINE SULFATE (PF) 4 MG/ML IV SOLN
6.0000 mg | Freq: Once | INTRAVENOUS | Status: AC
  Administered 2019-05-13: 6 mg via INTRAVENOUS
  Filled 2019-05-13: qty 2

## 2019-05-13 MED ORDER — DIPHENHYDRAMINE HCL 50 MG/ML IJ SOLN: 12.5000 mg | INTRAMUSCULAR | Status: DC | PRN

## 2019-05-13 MED ORDER — PHENYLEPHRINE 40 MCG/ML (10ML) SYRINGE FOR IV PUSH (FOR BLOOD PRESSURE SUPPORT): 80.0000 ug | PREFILLED_SYRINGE | INTRAVENOUS | Status: DC | PRN

## 2019-05-13 MED ORDER — TERBUTALINE SULFATE 1 MG/ML IJ SOLN: 0.2500 mg | Freq: Once | INTRAMUSCULAR | Status: DC | PRN

## 2019-05-13 MED ORDER — DIPHENHYDRAMINE HCL 50 MG/ML IJ SOLN
25.0000 mg | Freq: Once | INTRAMUSCULAR | Status: AC
  Administered 2019-05-13: 25 mg via INTRAVENOUS
  Filled 2019-05-13: qty 1

## 2019-05-13 NOTE — Progress Notes (Signed)
OB/GYN Faculty Practice: Labor Progress Note  Subjective: Strip note. Plan of care discussed with RN.   Objective: BP 105/70   Pulse 80   Temp 98.4 F (36.9 C) (Oral)   Resp 17   Ht 5' (1.524 m)   Wt 72.7 kg   LMP 08/27/2018   SpO2 100%   BMI 31.30 kg/m  Gen: strip note Dilation: (Foley bulb intact in cervix) Presentation: Vertex Exam by:: Dr. Juleen China  Assessment and Plan: 28 y.o. G4P0030 [redacted]w[redacted]d here for IOL for gHTN.   Labor: FB still securely in place. 2nd dose of cytotec around 0330 and contracting every 1-2 minutes. Uncomfortable and receiving IV fentanyl.  -- pain control: IV fentanyl, wants epidural -- PPH Risk: low   Fetal Wellbeing: EFW 2759g (72%) at  35w2. Cephalic by sutures .  -- GBS (negative) -- continuous fetal monitoring - category I   GHTN: BP normal to moderate range. Asymptomatic now.  Labs wnl, UPC undetectable.  -- continue to monitor closely   Whitney Gonzalez S. Juleen China, DO OB/GYN Fellow, Faculty Practice  4:51 AM

## 2019-05-13 NOTE — Progress Notes (Signed)
OB/GYN Faculty Practice: Labor Progress Note  Subjective:  Patient feeling better and headache has resolved with acetaminophen. She reports that her right leg hurts around her thigh. She is not having any difficulty or pain with breathing.  Objective:  BP 120/83   Pulse 85   Temp 99.1 F (37.3 C) (Oral)   Resp 18   Ht 5' (1.524 m)   Wt 72.7 kg   LMP 08/27/2018   SpO2 99%   BMI 31.30 kg/m  Gen: Lying in bed comfortably  Extremities: 1+ BLEE to knees. No obvious unilateral warmth or erythema. Homan's negative.   CE: Dilation: 8 Effacement (%): 70 Cervical Position: Middle Station: 0 Presentation: Vertex Exam by:: Zettie Cooley Resident  Assessment and Plan:  Whitney Gonzalez is a 28 y.o. G4P0030 at [redacted]w[redacted]d IOL gHTN  Labor: Progressing well. CE as above. Anterior lip remains swollen. Low suspicion for DVT given normal physical exam at this time; however, very low threshold to u/s if change in physical exam. Will restart pitocin as contractions inadequate (140-160) on tracing. Pitocin 2x2.  -- pain control: epidural  -- PPH Risk: med  Fetal Well-Being:  Baseline 145, mod variability, +a, -d -- Category I tracing - Continuous fetal monitoring  -- GBS Negative (06/04 0000)  Zettie Cooley, M.D.  Family Medicine  PGY-1 05/13/2019 11:26 PM

## 2019-05-13 NOTE — Anesthesia Procedure Notes (Signed)
Epidural Patient location during procedure: OB Start time: 05/13/2019 3:34 PM End time: 05/13/2019 3:45 PM  Staffing Anesthesiologist: Duane Boston, MD Performed: anesthesiologist   Preanesthetic Checklist Completed: patient identified, site marked, pre-op evaluation, timeout performed, IV checked, risks and benefits discussed and monitors and equipment checked  Epidural Patient position: sitting Prep: DuraPrep Patient monitoring: heart rate, cardiac monitor, continuous pulse ox and blood pressure Approach: midline Location: L2-L3 Injection technique: LOR saline  Needle:  Needle type: Tuohy  Needle gauge: 17 G Needle length: 9 cm Needle insertion depth: 5 cm Catheter size: 20 Guage Catheter at skin depth: 10 cm Test dose: negative and Other  Assessment Events: blood not aspirated, injection not painful, no injection resistance and negative IV test  Additional Notes Informed consent obtained prior to proceeding including risk of failure, 1% risk of PDPH, risk of minor discomfort and bruising.  Discussed rare but serious complications including epidural abscess, permanent nerve injury, epidural hematoma.  Discussed alternatives to epidural analgesia and patient desires to proceed.  Timeout performed pre-procedure verifying patient name, procedure, and platelet count.  Patient tolerated procedure well.

## 2019-05-13 NOTE — Progress Notes (Signed)
Patient ID: MURNA BACKER, female   DOB: Sep 07, 1991, 28 y.o.   MRN: [id number] JAUNITA MIKELS is a 28 y.o. G4P0030 at [redacted]w[redacted]d.  Subjective: Increased cramping  Objective: BP 135/83   Pulse 80   Temp 98.1 F (36.7 C) (Oral)   Resp 18   Ht 5' (1.524 m)   Wt 72.7 kg   LMP 08/27/2018   SpO2 100%   BMI 31.30 kg/m    FHT:  FHR: 125 bpm, variability: mod,  accelerations:  15x15,  decelerations:  Few variables resolved w/ position changes UC:   Q 2-3 minutes, mild-mod Dilation: 4 Effacement (%): 50 Cervical Position: Posterior Station: -2 Presentation: Vertex Exam by:: [employer] RN  Labs: NA  Assessment / Plan: [redacted]w[redacted]d week IUP Labor: Early  Fetal Wellbeing:  Category I-II, overall reassuring Pain Control:  Requesting IV pain meds  Anticipated MOD:  SVD Start low-dose pitocin and CTO FHR closely  May have stadol or epidural.  Tamala Julian, [place], [place] 05/13/2019 1:36 PM

## 2019-05-13 NOTE — Anesthesia Preprocedure Evaluation (Signed)
Anesthesia Evaluation  Patient identified by MRN, date of birth, ID band Patient awake    Reviewed: Allergy & Precautions, NPO status , Patient's Chart, lab work & pertinent test results  History of Anesthesia Complications Negative for: history of anesthetic complications  Airway Mallampati: II  TM Distance: >3 FB Neck ROM: Full    Dental no notable dental hx. (+) Dental Advisory Given   Pulmonary asthma ,    Pulmonary exam normal        Cardiovascular hypertension, Normal cardiovascular exam     Neuro/Psych  Headaches, PSYCHIATRIC DISORDERS Anxiety Depression Bipolar Disorder    GI/Hepatic Neg liver ROS, GERD  ,  Endo/Other  Hypothyroidism   Renal/GU negative Renal ROS  negative genitourinary   Musculoskeletal negative musculoskeletal ROS (+)   Abdominal   Peds negative pediatric ROS (+)  Hematology negative hematology ROS (+)   Anesthesia Other Findings   Reproductive/Obstetrics negative OB ROS                             Anesthesia Physical Anesthesia Plan  ASA: II  Anesthesia Plan: Epidural   Post-op Pain Management:    Induction:   PONV Risk Score and Plan:   Airway Management Planned: Natural Airway  Additional Equipment:   Intra-op Plan:   Post-operative Plan:   Informed Consent: I have reviewed the patients History and Physical, chart, labs and discussed the procedure including the risks, benefits and alternatives for the proposed anesthesia with the patient or authorized representative who has indicated his/her understanding and acceptance.     Dental advisory given  Plan Discussed with: Anesthesiologist  Anesthesia Plan Comments:         Anesthesia Quick Evaluation

## 2019-05-13 NOTE — Progress Notes (Signed)
Foley bulb out at 8am.

## 2019-05-13 NOTE — Progress Notes (Signed)
Patient ID: Whitney Gonzalez, female   DOB: 1990/12/25, 28 y.o.   MRN: [id number] Whitney Gonzalez is a 28 y.o. G4P0030 at [redacted]w[redacted]d.   Subjective: Much more comfortable since Foley came out. Pain 3/10.   Objective: BP 103/72   Pulse 67   Temp 98.2 F (36.8 C) (Oral)   Resp 18   Ht 5' (1.524 m)   Wt 72.7 kg   LMP 08/27/2018   SpO2 100%   BMI 31.30 kg/m    FHT:  FHR: 125 bpm, variability: mod,  accelerations:  15x15,  decelerations:  None UC:   Q 1.5-5 minutes, mild-mod Dilation: 4 Effacement (%): 50 Station: -2 Presentation: Vertex Exam by:: Sharyn Lull, RN   Labs: Results for orders placed or performed during the hospital encounter of 05/12/19 (from the past 24 hour(s))  ABO/Rh     Status: None (Preliminary result)   Collection Time: 05/12/19  9:00 PM  Result Value Ref Range   ABO/RH(D)      O POS Performed at [facility], [place] [address] [place], [place] [address]   Maryann Alar Test     Status: None   Collection Time: 05/12/19  9:07 PM  Result Value Ref Range   POCT Fern Test Negative = intact amniotic membranes   CBC     Status: Abnormal   Collection Time: 05/12/19  9:10 PM  Result Value Ref Range   WBC 11.1 (H) 4.0 - 10.5 K/uL   RBC 4.01 3.87 - 5.11 MIL/uL   Hemoglobin 12.9 12.0 - 15.0 g/dL   HCT 37.0 36.0 - 46.0 %   MCV 92.3 80.0 - 100.0 fL   MCH 32.2 26.0 - 34.0 pg   MCHC 34.9 30.0 - 36.0 g/dL   RDW 12.3 11.5 - 15.5 %   Platelets 212 150 - 400 K/uL   nRBC 0.0 0.0 - 0.2 %  Type and screen     Status: None   Collection Time: 05/12/19  9:10 PM  Result Value Ref Range   ABO/RH(D) O POS    Antibody Screen NEG    Sample Expiration      05/15/2019,2359 Performed at [facility], [place] [address], [place], [place] [address]   CBC     Status: Abnormal   Collection Time: 05/13/19  8:10 AM  Result Value Ref Range   WBC 10.6 (H) 4.0 - 10.5 K/uL   RBC 4.11 3.87 - 5.11 MIL/uL   Hemoglobin 13.0 12.0 - 15.0 g/dL   HCT 38.1 36.0 - 46.0 %   MCV 92.7 80.0 -  100.0 fL   MCH 31.6 26.0 - 34.0 pg   MCHC 34.1 30.0 - 36.0 g/dL   RDW 12.5 11.5 - 15.5 %   Platelets 185 150 - 400 K/uL   nRBC 0.0 0.0 - 0.2 %    Assessment / Plan: [redacted]w[redacted]d week IUP Labor: Early/IOL. Foley now out. Contracting frequently. Will recheck in 2 hours and consider pitocin PRN. Fetal Wellbeing:  Category I Pain Control:  IV pin meds Anticipated MOD:  SVD  Manya Silvas, [place] 05/13/2019 9:16 AM

## 2019-05-13 NOTE — Progress Notes (Signed)
OB/GYN Faculty Practice: Labor Progress Note  Subjective:  Patient is tearful and feeling unwell.  She reports that she has a headache and that she is uncomfortable sitting on her side but the baby does not like it when she changes positions.  She reports that her right leg is falling asleep and feels numb.  Patient requests that we check her cervix at this time.  It was just previously checked about an hour ago.  Objective:  BP 118/84   Pulse 90   Temp 98.7 F (37.1 C) (Oral)   Resp 18   Ht 5' (1.524 m)   Wt 72.7 kg   LMP 08/27/2018   SpO2 100%   BMI 31.30 kg/m  Gen: Lying in bed tearful.  No acute distress.  Appears uncomfortable. Extremities: Nontender.  No sign of DVT. CE: Dilation: 6 Effacement (%): 50 Cervical Position: Middle Station: -1 Presentation: Vertex Exam by:: Manya Silvas CNM  Assessment and Plan:  Whitney Gonzalez is a 28 y.o. G4P0030 at [redacted]w[redacted]d IOL from G HTN  Labor: Last cervical check with patient now in active labor.  Her contractions are adequate and regular.  Ice placed at anterior cervix for swelling.  Patient also given Benadryl.  Also, acetaminophen for headache.  Will not check cervix at this time as patient was just recently checked.  Patient is agreeable to checking around 10-11PM. -- pain control: Epidural -- PPH Risk: Medium  Fetal Well-Being: EFW 275 9 g at 35 and 2.. Cephalic by CE. Baseline 125, moderate variability, -a, -d -- Category II tracing - Continuous fetal monitoring  -- GBS Negative (06/04 0000)  Zettie Cooley, M.D.  Family Medicine  PGY-1 05/13/2019 9:04 PM

## 2019-05-13 NOTE — Telephone Encounter (Signed)
Did not call patient with an appointment reminder as she is in the hospital.

## 2019-05-13 NOTE — Progress Notes (Signed)
Patient ID: Whitney Gonzalez, female   DOB: 07-Feb-1991, 28 y.o.   MRN: [id number] Whitney Gonzalez is a 28 y.o. G4P0030 at [redacted]w[redacted]d.  Subjective: Comfortable w/ epidural.  Objective: BP (!) 131/92   Pulse 90   Temp 98.5 F (36.9 C) (Oral)   Resp 18   Ht 5' (1.524 m)   Wt 72.7 kg   LMP 08/27/2018   SpO2 99%   BMI 31.30 kg/m    FHT:  FHR: 115 bpm, variability: mod,  accelerations:  15x15,  decelerations:  INtermittent variables and few lates UC:   Q 2-3 minutes, mod-strong MVU's 200- 220 Pitocin off for decels, but has recovered well.   Dilation: 5 Effacement (%): 50 Cervical Position: Middle Station: 0 Presentation: Vertex Exam by:: Sharyn Lull, RN   Cervix now swollen  Labs: NA  Assessment / Plan: [redacted]w[redacted]d week IUP Labor: Protracted early labor/IOL. MVU's now adequate Fetal Wellbeing:  Category I-II, overall reassuring Pain Control:  Epidural Anticipated MOD:  Uncertain. Will apply ice to cervix, give Benadryl for swollen cervix. Consider restarting pitocin if FHR remains reassuring.  Will try Exaggerated Sims to see if it aids fetal positioning and descent.    Tamala Julian, [place], [place] 05/13/2019 6:50 PM

## 2019-05-13 NOTE — Progress Notes (Signed)
OB/GYN Faculty Practice: Labor Progress Note  Subjective: Sleeping.  Objective: BP 113/65   Pulse 74   Temp 98 F (36.7 C) (Oral)   Resp 16   Ht 5' (1.524 m)   Wt 72.7 kg   LMP 08/27/2018   SpO2 100%   BMI 31.30 kg/m  Gen: sleeping Dilation: (foley bulb intact in cervix) Presentation: Vertex Exam by:: Dr. Juleen China  Assessment and Plan: 28 y.o. G4P0030 [redacted]w[redacted]d here for IOL for gHTN.   Labor: Induction started with outpatient FB, still in place. Getting vaginal cytotec q4hrs until FB out.  -- pain control: IV fentanyl, wants epidural -- PPH Risk: low   Fetal Wellbeing: EFW 2759g (72%) at  35w2. Cephalic by sutures .  -- GBS (negative) -- continuous fetal monitoring - category I   GHTN: BP normal to moderate range. Asymptomatic now.  Labs wnl, UPC undetectable.  -- continue to monitor closely   Whitney Mitten S. Juleen China, DO OB/GYN Fellow, Faculty Practice  1:16 AM

## 2019-05-13 NOTE — Progress Notes (Signed)
Patient ID: Whitney Gonzalez, female   DOB: March 02, 1991, 28 y.o.   MRN: [id number] Whitney Gonzalez is a 28 y.o. G4P0030 at [redacted]w[redacted]d.  Subjective: Mild UC's. Coping well.   Objective: BP 127/78   Pulse 86   Temp (!) 97.1 F (36.2 C) (Axillary)   Resp 18   Ht 5' (1.524 m)   Wt 72.7 kg   LMP 08/27/2018   SpO2 100%   BMI 31.30 kg/m    FHT:  FHR: 135 bpm, variability: mod,  accelerations:  15x15,  decelerations: None UC:   Q 2-3 minutes, mild-mod Dilation: 4 Effacement (%): 50 Station: -2 Presentation: Vertex Exam by:: Whitney Gonzalez, CNM   AROM mod amount of clear fluid  Labs: NA  Assessment / Plan: [redacted]w[redacted]d week IUP Labor: Early, now AROM'd Fetal Wellbeing:  Category I Pain Control:  Comfort measures Anticipated MOD:  SVD Start pitocin if contraction haven't intensified over next hour.   Whitney Gonzalez, [place], [place] 05/13/2019 11:27 AM

## 2019-05-14 ENCOUNTER — Encounter (HOSPITAL_COMMUNITY): Payer: Self-pay

## 2019-05-14 DIAGNOSIS — Z3A37 37 weeks gestation of pregnancy: Secondary | ICD-10-CM

## 2019-05-14 DIAGNOSIS — O134 Gestational [pregnancy-induced] hypertension without significant proteinuria, complicating childbirth: Secondary | ICD-10-CM

## 2019-05-14 LAB — CBC
HCT: 34 % — ABNORMAL LOW (ref 36.0–46.0)
Hemoglobin: 12.1 g/dL (ref 12.0–15.0)
MCH: 32.3 pg (ref 26.0–34.0)
MCHC: 35.6 g/dL (ref 30.0–36.0)
MCV: 90.7 fL (ref 80.0–100.0)
Platelets: 166 10*3/uL (ref 150–400)
RBC: 3.75 MIL/uL — ABNORMAL LOW (ref 3.87–5.11)
RDW: 12.3 % (ref 11.5–15.5)
WBC: 12.5 10*3/uL — ABNORMAL HIGH (ref 4.0–10.5)
nRBC: 0 % (ref 0.0–0.2)

## 2019-05-14 LAB — ABO/RH: ABO/RH(D): O POS

## 2019-05-14 MED ORDER — ACETAMINOPHEN 325 MG PO TABS
650.0000 mg | ORAL_TABLET | ORAL | Status: DC | PRN
  Administered 2019-05-14: 650 mg via ORAL
  Filled 2019-05-14: qty 2

## 2019-05-14 MED ORDER — POLYETHYLENE GLYCOL 3350 17 G PO PACK
17.0000 g | PACK | Freq: Every day | ORAL | Status: DC
  Administered 2019-05-14 – 2019-05-16 (×3): 17 g via ORAL
  Filled 2019-05-14 (×3): qty 1

## 2019-05-14 MED ORDER — IBUPROFEN 600 MG PO TABS
600.0000 mg | ORAL_TABLET | Freq: Four times a day (QID) | ORAL | Status: DC
  Administered 2019-05-14 – 2019-05-16 (×10): 600 mg via ORAL
  Filled 2019-05-14 (×10): qty 1

## 2019-05-14 MED ORDER — ONDANSETRON HCL 4 MG PO TABS: 4.0000 mg | ORAL_TABLET | ORAL | Status: DC | PRN

## 2019-05-14 MED ORDER — TETANUS-DIPHTH-ACELL PERTUSSIS 5-2.5-18.5 LF-MCG/0.5 IM SUSP: 0.5000 mL | Freq: Once | INTRAMUSCULAR | Status: DC

## 2019-05-14 MED ORDER — DIPHENHYDRAMINE HCL 25 MG PO CAPS: 25.0000 mg | ORAL_CAPSULE | Freq: Four times a day (QID) | ORAL | Status: DC | PRN

## 2019-05-14 MED ORDER — ZOLPIDEM TARTRATE 5 MG PO TABS
5.0000 mg | ORAL_TABLET | Freq: Every evening | ORAL | Status: DC | PRN
  Administered 2019-05-14 – 2019-05-15 (×2): 5 mg via ORAL
  Filled 2019-05-14 (×2): qty 1

## 2019-05-14 MED ORDER — BENZOCAINE-MENTHOL 20-0.5 % EX AERO
1.0000 "application " | INHALATION_SPRAY | CUTANEOUS | Status: DC | PRN
  Administered 2019-05-15: 1 via TOPICAL
  Filled 2019-05-14: qty 56

## 2019-05-14 MED ORDER — AMLODIPINE BESYLATE 5 MG PO TABS
10.0000 mg | ORAL_TABLET | Freq: Every day | ORAL | Status: AC
  Administered 2019-05-14 – 2019-05-15 (×2): 10 mg via ORAL
  Filled 2019-05-14 (×2): qty 2

## 2019-05-14 MED ORDER — SENNOSIDES-DOCUSATE SODIUM 8.6-50 MG PO TABS
2.0000 | ORAL_TABLET | ORAL | Status: DC
  Administered 2019-05-15 (×2): 2 via ORAL
  Filled 2019-05-14 (×2): qty 2

## 2019-05-14 MED ORDER — DOCUSATE SODIUM 100 MG PO CAPS
100.0000 mg | ORAL_CAPSULE | Freq: Two times a day (BID) | ORAL | Status: DC
  Administered 2019-05-14 – 2019-05-16 (×4): 100 mg via ORAL
  Filled 2019-05-14 (×4): qty 1

## 2019-05-14 MED ORDER — MEASLES, MUMPS & RUBELLA VAC IJ SOLR: 0.5000 mL | Freq: Once | INTRAMUSCULAR | Status: DC

## 2019-05-14 MED ORDER — DIBUCAINE (PERIANAL) 1 % EX OINT: 1.0000 "application " | TOPICAL_OINTMENT | CUTANEOUS | Status: DC | PRN

## 2019-05-14 MED ORDER — SIMETHICONE 80 MG PO CHEW: 80.0000 mg | CHEWABLE_TABLET | ORAL | Status: DC | PRN

## 2019-05-14 MED ORDER — WITCH HAZEL-GLYCERIN EX PADS: 1.0000 "application " | MEDICATED_PAD | CUTANEOUS | Status: DC | PRN

## 2019-05-14 MED ORDER — PRENATAL MULTIVITAMIN CH
1.0000 | ORAL_TABLET | Freq: Every day | ORAL | Status: DC
  Administered 2019-05-14 – 2019-05-16 (×3): 1 via ORAL
  Filled 2019-05-14 (×3): qty 1

## 2019-05-14 MED ORDER — COCONUT OIL OIL
1.0000 "application " | TOPICAL_OIL | Status: DC | PRN
  Administered 2019-05-15: 1 via TOPICAL

## 2019-05-14 MED ORDER — ONDANSETRON HCL 4 MG/2ML IJ SOLN: 4.0000 mg | INTRAMUSCULAR | Status: DC | PRN

## 2019-05-14 NOTE — Anesthesia Postprocedure Evaluation (Signed)
Anesthesia Post Note  Patient: Whitney Gonzalez  Procedure(s) Performed: AN AD [place]     Patient location during evaluation: Mother Baby Anesthesia Type: Epidural Level of consciousness: awake and alert and oriented Pain management: satisfactory to patient Vital Signs Assessment: post-procedure vital signs reviewed and stable Respiratory status: spontaneous breathing and nonlabored ventilation Cardiovascular status: stable Postop Assessment: no headache, no backache, no signs of nausea or vomiting, adequate PO intake, patient able to bend at knees, no apparent nausea or vomiting and able to ambulate (patient up walking) Anesthetic complications: no    Last Vitals:  Vitals:   05/14/19 0931 05/14/19 1357  BP: 120/90 (!) 130/101  Pulse: 76 83  Resp: 18 18  Temp: 36.6 C 36.8 C  SpO2: 99% 100%    Last Pain:  Vitals:   05/14/19 1357  TempSrc: Oral  PainSc:    Pain Goal: Patients Stated Pain Goal: 4 (05/13/19 1330)                 Willa Rough

## 2019-05-14 NOTE — Lactation Note (Signed)
This note was copied from a baby's chart. Lactation Consultation Note  Patient Name: Whitney Gonzalez [id number] Date: 05/14/2019 Reason for consult: Initial assessment;Early term 37-38.6wks;Primapara;1st time breastfeeding  P1 mother whose infant is now 90 hours old.  This is an ETI at 37+1 weeks.  Baby was sleeping in mother's arms when I arrived.  Mother had a lot of basic breast feeding and newborn questions.  Both parents are very receptive to learning and appreciate all advice.  Discussed the "ETI" with parents and what to expect.  Upon assessment, mother's breasts are soft and non tender and nipples are short shafted and intact.  Nipples invert when compressed.  Provided breast shells and manual pump with instructions to help evert nipples.  Taught hand expression and breast massage.  Mother practiced her technique but was unable to obtain colostrum drops at this time.  Colostrum container provided and milk storage times reviewed.  Finger feeding demonstrated.  Offered to initiate the DEBP for mother to help stimulate breasts and mother willing to pump.  Mother is very willing "to do anything I can to help my baby."  She is so excited to learn.  Pump parts, assembly, disassembly and cleaning discussed.  Mother does not have a DEBP for home use at this time but is interested in obtaining one.  She does not have private insurance or participate in [facility] at this time.  She is planning to apply for [facility].  Mother also stated that she will purchase one if she has to.    Encouraged mother to call her RN/LC for latch assistance as needed.  Discussed ways to help awaken baby and how to keep him awake at the breast when feeding.  Educated her on the importance of resting and eating well with breast feeding.  Mother asked many questions and reinforcement may be necessary since she was provided with a lot of information this morning.  Father very supportive and also asked appropriate questions.    Mom  made aware of O/P services, breastfeeding support groups, community resources, and our phone # for post-discharge questions.  RN updated.   Maternal Data Formula Feeding for Exclusion: No Has patient been taught Hand Expression?: Yes Does the patient have breastfeeding experience prior to this delivery?: No  Feeding    LATCH Score                   Interventions    Lactation Tools Discussed/Used WIC Program: No(plans to apply) Pump Review: Setup, frequency, and cleaning;Milk Storage Initiated by:: Whitney Gonzalez Date initiated:: 05/14/19   Consult Status Consult Status: Follow-up Date: 05/15/19 Follow-up type: In-patient    Whitney Gonzalez 05/14/2019, 11:32 AM

## 2019-05-14 NOTE — Discharge Summary (Addendum)
Obstetrics Discharge Summary OB/GYN Faculty Practice   Patient Name: Whitney Gonzalez DOB: November 20, 1991 MRN: [id number]  Date of admission: 05/12/2019 Delivering MD: Zettie Cooley E   Date of discharge: 05/16/2019  Admitting diagnosis: pregnancy Intrauterine pregnancy: [redacted]w[redacted]d     Secondary diagnosis:   Principal Problem:   Gestational hypertension Active Problems:   Bipolar I disorder ([place])   Subchorionic hematoma   History of maternal syphilis, currently pregnant   Asthma   Hypothyroidism    Discharge diagnosis: Term Pregnancy Delivered                                            Postpartum procedures: None Complications: None  Outpatient Follow-Up: [ ]  Patient sent home w/ POPs - check in on progress  [ ]  BP check [ ]  mood check during BP check - patient starting Celexa 10mg  daily at discharge for anxiety after speaking with CSW during admission, provided resources for [facility] course: Whitney Gonzalez is a 28 y.o. [redacted]w[redacted]d who was admitted for induction of labor for gestational hypertension. Her pregnancy was complicated by above noted. Her labor course was notable for induction with FB, cytotec, pitocin, and AROM as well as epidural placement. Delivery was complicated by 1st degree laceration repaired. Please see delivery/op note for additional details. Her postpartum course was uncomplicated. She was breastfeeding and supplementing with formula. Her blood pressure was well-controlled throughout hospitalization, and she denied headaches, blurry vision, shortness of breath on day of discharge. By day of discharge, she was passing flatus, urinating, eating and drinking without difficulty. Her pain was well-controlled, and she was discharged home with ibuprofen. She will follow-up in clinic in 4 weeks.   Physical exam  Vitals:   05/15/19 0928 05/15/19 1633 05/15/19 2130 05/16/19 0519  BP: (!) 121/91 134/65 134/89 126/87  Pulse:  68 92 86  Resp:  17 18 18   Temp:  98.1 F  (36.7 C) 98.2 F (36.8 C) 97.9 F (36.6 C)  TempSrc:  Oral Oral Oral  SpO2:    98%  Weight:      Height:       General: NAD, lying in bed comfortably  Lochia: appropriate Uterine Fundus: firm Incision: N/A DVT Evaluation: No evidence of DVT seen on physical exam.  Labs: Lab Results  Component Value Date   WBC 12.5 (H) 05/14/2019   HGB 12.1 05/14/2019   HCT 34.0 (L) 05/14/2019   MCV 90.7 05/14/2019   PLT 166 05/14/2019   CMP Latest Ref Rng & Units 05/12/2019  Glucose 70 - 99 mg/dL 79  BUN 6 - 20 mg/dL 7  Creatinine 0.44 - 1.00 mg/dL 0.41(L)  Sodium 135 - 145 mmol/L 136  Potassium 3.5 - 5.1 mmol/L 3.9  Chloride 98 - 111 mmol/L 108  CO2 22 - 32 mmol/L 20(L)  Calcium 8.9 - 10.3 mg/dL 9.1  Total Protein 6.5 - 8.1 g/dL 6.1(L)  Total Bilirubin 0.3 - 1.2 mg/dL 0.7  Alkaline Phos 38 - 126 U/L 124  AST 15 - 41 U/L 18  ALT 0 - 44 U/L 15    Discharge instructions: Per After Visit Summary and "Baby and Me Booklet"  After visit meds:  Allergies as of 05/16/2019      Reactions   Azithromycin Other (See Comments)   Steven-Johnsons   Banana Anaphylaxis   Doxycycline Hyclate Shortness Of Breath, Nausea Only  Latex Anaphylaxis   Other reaction(s): hives   Mango Flavor Anaphylaxis   Other Anaphylaxis, Rash, Other (See Comments)   Banana, mango, avocado     Propranolol Hcl Shortness Of Breath   Other reaction(s): asthma symptoms worsened   Scallops [shellfish Allergy] Anaphylaxis   Other reaction(s): throat swellinf   Allegra [fexofenadine] Hives   Metronidazole Hives   Sprintec 28 [norgestimate-eth Estradiol] Nausea And Vomiting   Tegretol [carbamazepine] Other (See Comments)   Other reaction(s): steven's -johnson syndrome Kathreen Cosier   Amoxicillin-pot Clavulanate Nausea And Vomiting, Rash   Other reaction(s): rash, mood destabilization   Cephalexin Rash   Codeine Palpitations   Heart rate goes over 200bpm   Penicillins Rash   Has patient had a PCN reaction causing  immediate rash, facial/tongue/throat swelling, SOB or lightheadedness with hypotension: unknown Has patient had a PCN reaction causing severe rash involving mucus membranes or skin necrosis: unknown Has patient had a PCN reaction that required hospitalization unknown Has patient had a PCN reaction occurring within the last 10 years: unknown If all of the above answers are "NO", then may proceed with Cephalosporin use.   Sulfamethoxazole-trimethoprim Rash      Medication List    TAKE these medications   acetaminophen 325 MG tablet Commonly known as: Tylenol Take 2 tablets (650 mg total) by mouth every 4 (four) hours as needed for mild pain.   albuterol 108 (90 Base) MCG/ACT inhaler Commonly known as: VENTOLIN HFA Inhale 1-2 puffs into the lungs every 6 (six) hours as needed for wheezing or shortness of breath.   amLODipine 10 MG tablet Commonly known as: NORVASC Take 1 tablet (10 mg total) by mouth daily.   budesonide-formoterol 80-4.5 MCG/ACT inhaler Commonly known as: Symbicort Inhale 1 puff into the lungs 2 (two) times daily as needed. May increase to 2 puffs twice a day if no improvement in 1 week.   calcium carbonate 750 MG chewable tablet Commonly known as: TUMS EX Chew 1 tablet by mouth daily.   citalopram 10 MG tablet Commonly known as: CeleXA Take 1 tablet (10 mg total) by mouth daily for 30 days.   Weir 1 each by Does not apply route daily as needed.   diphenhydramine-acetaminophen 25-500 MG Tabs tablet Commonly known as: TYLENOL PM Take 1 tablet by mouth at bedtime as needed.   docusate sodium 100 MG capsule Commonly known as: COLACE Take 1 capsule (100 mg total) by mouth 2 (two) times daily.   EpiPen 2-Pak 0.3 mg/0.3 mL Soaj injection Generic drug: EPINEPHrine as directed   ibuprofen 800 MG tablet Commonly known as: ADVIL Take 1 tablet (800 mg total) by mouth 3 (three) times daily.   norethindrone 0.35 MG tablet Commonly  known as: MICRONOR Start on the Sunday after baby turns 16 weeks old   polyethylene glycol 17 g packet Commonly known as: MIRALAX / GLYCOLAX Take 17 g by mouth daily as needed for mild constipation.   prenatal multivitamin Tabs tablet Take 1 tablet by mouth daily at 12 noon.       Postpartum contraception: Progesterone only pills Diet: Routine Diet Activity: Advance as tolerated. Pelvic rest for 6 weeks.   Follow-up Appt: Future Appointments  Date Time Provider [place]  05/22/2019  9:00 AM [facility]  06/11/2019 10:35 AM [facility] Rona Ravens, NP [facility]  06/11/2019 11:30 AM [place]   Follow-up Visit:No follow-ups on file.  Please schedule this patient for Postpartum  visit in: 4 weeks with the following provider: Any provider High risk pregnancy complicated by: gHTN Delivery mode:  SVD Anticipated Birth Control:  POPs PP Procedures needed: BP check  Schedule Integrated [place] visit: yes - hx bipolar  Newborn Data: Live born female  Birth Weight:  3062 g APGAR: 7, 8  Newborn Delivery   Birth date/time:  05/14/2019 02:28:00 Delivery type:  Vaginal, Spontaneous    Baby Feeding: Bottle and Breast Disposition: Home  Zettie Cooley, PGY-1 Family Medicine   OB FELLOW DISCHARGE ATTESTATION  I have seen and examined this patient and agree with above documentation in the resident's note.   Phill Myron, D.O. OB Fellow  05/16/2019, 11:55 AM  Exam per Dr. Maudie Mercury and Dr. Juleen China. I am adding an addendum to this note to update the med rec to include new addition of anxiety medication - Celexa.   Lambert Mody. Juleen China, DO OB/GYN Fellow

## 2019-05-15 ENCOUNTER — Telehealth: Payer: Medicaid Other | Admitting: Obstetrics and Gynecology

## 2019-05-15 MED ORDER — NORETHINDRONE 0.35 MG PO TABS: ORAL_TABLET | ORAL | 11 refills | Status: DC

## 2019-05-15 NOTE — Lactation Note (Signed)
This note was copied from a baby's chart. Lactation Consultation Note  Patient Name: Boy Ivania Teagarden [id number] Date: 05/15/2019    [facility] Follow Up Visit:  Per RN, mother is trying to sleep.  She has been pumping and obtaining small amounts of colostrum.  Baby has been bottle feeding without difficulty.                     Luis Nickles R Marianne Golightly 05/15/2019, 3:38 PM

## 2019-05-15 NOTE — Lactation Note (Signed)
This note was copied from a baby's chart. Lactation Consultation Note  Patient Name: Whitney Gonzalez [id number] Date: 05/15/2019 Reason for consult: Early term 37-38.6wks;Follow-up assessment;1st time breastfeeding;Primapara;Infant weight loss(5 % weight loss)  Baby is 66 hours old  As LC entered the room mom sitting in bed with a bra and mentioned her breast feel different/ heavier  And mom was thinking her breast were engorged.  LC offered to check her breast and noted the lateral aspects of both breast were warm to touch and fuller Than the rest of the both breast and soft.  Mom expressed feelings that she should be getting more milk by now.  LC reviewed supply / demand and it can be a slow process.  LC recommended prior to pumping warm compresses for a few minutes. Breast massage, hand express,  Pump both breast for 15 - 20 mins and if not using the pumping bra massage the sides of her breast while  Pumping to enhance let down.  LC reviewed engorgement prevention and tx if her milk comes in tonight.  LC stressed to mom and dad it all can be a slow process.  [place] praised mom for her efforts pumping and continue, also to wear her shells between feedings except when  Sleeping to enhance the nipple / and areola complex to elongate in hopes to latch the baby.  Last feeding baby took 2 ml of EBM and 24 ml of formula @ 1435. Mom aware to feed with feeding cues and by 3 hours  25- 30 ml.  DEBP set up in the room and mom aware of how to use it.     Maternal Data    Feeding Feeding Type: Breast Milk with Formula added Nipple Type: Slow - flow  LATCH Score                   Interventions Interventions: Breast feeding basics reviewed;DEBP  Lactation Tools Discussed/Used WIC Program: (per mom still needs to call [place] to get an appt.) Pump Review: Milk Storage(per mom has pumped 5-6 times in the last 24 hours.)   Consult Status Consult Status: Follow-up Date:  05/16/19 Follow-up type: In-patient    [place] 05/15/2019, 4:30 PM

## 2019-05-15 NOTE — Progress Notes (Addendum)
Post Partum Day 1 Subjective: Pt is doing well this morning. Pt has no complaints other than generalized soreness which is relieved with ibuprofen. Pt denies HA or vision changes. Pt is breastfeeding.  no complaints, up ad lib, voiding, tolerating PO and + flatus    Objective: Blood pressure 107/66, pulse 63, temperature 98.2 F (36.8 C), temperature source Oral, resp. rate 18, height 5' (1.524 m), weight 72.7 kg, last menstrual period 08/27/2018, SpO2 100 %, unknown if currently breastfeeding.  Physical Exam:  General: alert, cooperative and no distress Lochia: appropriate Uterine Fundus: firm Incision: n/a DVT Evaluation: No evidence of DVT seen on physical exam. Negative Homan's sign. No cords or calf tenderness. No significant calf/ankle edema.  Recent Labs    05/13/19 0810 05/14/19 0406  HGB 13.0 12.1  HCT 38.1 34.0*    Assessment/Plan: Plan for discharge tomorrow, Breastfeeding, Circumcision prior to discharge and Contraception POPs   LOS: 3 days   Renee Harder, SNM 05/15/2019, 7:33 AM   I confirm that I have verified the information documented in the SNM's note and that I have also personally reperformed the physical exam and all medical decision making activities.  Patient was seen and examined by me also Agree with note, patient had a lot of questions about circ, breastfeeding, "cervical hematoma" (answered) Vitals stable Labs stable Fundus firm, lochia within normal limits Perineum healing Ext WNL Continue care Anticipate discharge tomorrow  Seabron Spates, CNM

## 2019-05-16 MED ORDER — CYCLOBENZAPRINE HCL 5 MG PO TABS
5.0000 mg | ORAL_TABLET | Freq: Once | ORAL | Status: AC
  Administered 2019-05-16: 5 mg via ORAL
  Filled 2019-05-16: qty 1

## 2019-05-16 MED ORDER — ACETAMINOPHEN 325 MG PO TABS: 650.0000 mg | ORAL_TABLET | ORAL | Status: DC | PRN

## 2019-05-16 MED ORDER — CITALOPRAM HYDROBROMIDE 10 MG PO TABS: 10.0000 mg | ORAL_TABLET | Freq: Every day | ORAL | 0 refills | Status: DC

## 2019-05-16 MED ORDER — IBUPROFEN 600 MG PO TABS: 600.0000 mg | ORAL_TABLET | Freq: Four times a day (QID) | ORAL | 0 refills | Status: DC

## 2019-05-16 MED ORDER — DOCUSATE SODIUM 100 MG PO CAPS: 100.0000 mg | ORAL_CAPSULE | Freq: Two times a day (BID) | ORAL | 0 refills | Status: DC

## 2019-05-16 MED ORDER — AMLODIPINE BESYLATE 5 MG PO TABS
10.0000 mg | ORAL_TABLET | Freq: Every day | ORAL | Status: DC
  Administered 2019-05-16: 10 mg via ORAL
  Filled 2019-05-16: qty 2
  Filled 2019-05-16: qty 1

## 2019-05-16 MED ORDER — POLYETHYLENE GLYCOL 3350 17 G PO PACK: 17.0000 g | PACK | Freq: Every day | ORAL | 0 refills | Status: DC | PRN

## 2019-05-16 MED ORDER — IBUPROFEN 800 MG PO TABS: 800.0000 mg | ORAL_TABLET | Freq: Three times a day (TID) | ORAL | 0 refills | Status: DC

## 2019-05-16 MED ORDER — AMLODIPINE BESYLATE 10 MG PO TABS: 10.0000 mg | ORAL_TABLET | Freq: Every day | ORAL | 0 refills | Status: DC

## 2019-05-16 MED FILL — DOK 100 MG CAPS: 100 | 15 days supply | Qty: 30 | Fill #0

## 2019-05-16 MED FILL — IBUPROFEN 800 MG TAB: 800 | 10 days supply | Qty: 30 | Fill #0

## 2019-05-16 MED FILL — POLYETHYLENE GLYCOL 3350 PO: 17 | 14 days supply | Qty: 238 | Fill #0

## 2019-05-16 MED FILL — CITALOPRAM HBR 20 MG TABLET: 20 | 30 days supply | Qty: 15 | Fill #0

## 2019-05-16 MED FILL — AMLODIPINE BESYLATE 10 MG T: 10 | 30 days supply | Qty: 30 | Fill #0

## 2019-05-16 NOTE — Clinical Social Work Maternal (Signed)
CLINICAL SOCIAL WORK MATERNAL/CHILD NOTE  Patient Details  Name: Whitney Gonzalez MRN: [id number] Date of Birth: 12/13/1990  Date:  05/16/2019  Clinical Social Worker Initiating Note:  Maryrose Colvin Date/Time: Initiated:  05/16/19/0911     Child's Name:  Whitney Gonzalez V   Biological Parents:  Mother, Father(Whitney Gonzalez and Whitney Gonzalez DOB: 10/14/1993)   Need for Interpreter:  None   Reason for Referral:  Behavioral Health Concerns   Address:  [address] [place] [address]    Phone number:  [phone] (home)     Additional phone number:   Household Members/Support Persons (HM/SP):   Household Member/Support Person 1, Household Member/Support Person 2, Household Member/Support Person 3, Household Member/Support Person 4, Household Member/Support Person 5   HM/SP Name Relationship DOB or Age  HM/SP -1 Patricia Gasbarro Mother    HM/SP -2 Earle Wecker Father    HM/SP -3   Grandmother    HM/SP -4   Sister    HM/SP -5 Whitney Gonzalez FOB 10/14/1993  HM/SP -6        HM/SP -7        HM/SP -8          Natural Supports (not living in the home):  Friends   Professional Supports: None   Employment: Unemployed   Type of Work:     Education:  Vocation/technical training   Homebound arranged:    Financial Resources:  Medicaid   Other Resources:  (MOB provided with information to start WIC process)   Cultural/Religious Considerations Which May Impact Care:    Strengths:  Ability to meet basic needs , Home prepared for child , Pediatrician chosen   Psychotropic Medications:         Pediatrician:    [place] area  Pediatrician List:   [place]    [place]    [place]    [place]    [place]    [place]      Pediatrician Fax Number:    Risk Factors/Current Problems:  Mental Health Concerns    Cognitive State:  Able to Concentrate , Alert , Linear Thinking , Insightful     Mood/Affect:  Animated, Bright , Happy , Interested , Overwhelmed     CSW Assessment:  CSW received consult for history of anxiety, depression, bipolar disorder.  CSW met with MOB to offer support and complete assessment.    MOB up and walking around the room, speaking on her phone, when CSW entered the room. FOB also present and sitting on the bed. CSW observed that MOB was on the phone speaking with her mother and appeared to be upset. MOB asked CSW if she was able to use breast pump after she was discharged since baby would have to stay for another night. CSW informed MOB that she did not see why that would not be an option but would verify with nursing staff. MOB appeared to calm down some once receiving that information. Per MOB, she and FOB were informed that infant's bilirubin levels had gone up and would need to be started on phototherapy. MOB seemed overwhelmed by how things would work since she would be discharged and infant would be staying. CSW spoke with MOB and allowed her to process her feelings, questions and concerns before completing assessment. Once MOB was ready, CSW explained reason for consult and MOB expressed understanding. CSW received verbal permission from MOB to complete assessment with FOB present. FOB engaged throughout assessment and appeared to   be a good support for MOB. Per MOB, she currently lives with her grandmother, her parents, her sister and FOB. 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 MOB reported she currently receives Pregnancy Medicaid and had questions about how to get that transitioned to regular Medicaid and how to get infant insurance. CSW provided MOB with number for [place] DSS who would be able to best assist her with her questions. MOB also expressed interest in getting started with WIC and CSW provided her with their  number so that she could start that process.   CSW inquired about MOB's mental health history and MOB acknowledged being diagnosed with bipolar disorder when she was 7-years-old. MOB also stated she has a history of anxiety and depression and reported her anxiety often contributes to her bipolar disorder. MOB also stated she may have some OCD and PTSD but has never been diagnosed. MOB appeared to be self-aware and had insight into her mental health. MOB denied currently being on medications or receiving counseling but was open to counseling resources. MOB also shared with CSW that she had some interest in being started on medications. CSW received verbal permission from MOB to reach out to [facility] to see if they would assess her for medications. CSW provided education regarding the baby blues period vs. perinatal mood disorders, discussed treatment and gave resources for mental health follow up if concerns arise.  CSW recommends self-evaluation during the postpartum time period using the New Mom Checklist from Postpartum Progress and encouraged MOB to contact a medical professional if symptoms are noted at any time. MOB denied any current SI or HI and reported having good support from her parents, FOB, her mother-in-law and some friends.   MOB and FOB confirmed having all essential items for infant once discharged and MOB reported infant would be sleeping in a basinet once home. CSW provided review of Sudden Infant Death Syndrome (SIDS) precautions and safe sleeping habits.     MOB denied any further questions, concerns, or need for resources at this time. CSW will continue to follow to offer additional support to MOB and FOB.    CSW Plan/Description:  No Further Intervention Required/No Barriers to Discharge, Psychosocial Support and Ongoing Assessment of Needs, Sudden Infant Death Syndrome (SIDS) Education, Perinatal Mood and Anxiety Disorder (PMADs) Education    Aryan Sparks,  LCSWA 05/16/2019, 11:08 AM 

## 2019-05-16 NOTE — Lactation Note (Signed)
This note was copied from a baby's chart. Lactation Consultation Note  Patient Name: Whitney Gonzalez SWNIO'E Date: 05/16/2019 Reason for consult: Early term 37-38.6wks;Primapara;1st time breastfeeding;Hyperbilirubinemia;Other (Comment)(single photo tx added this am / post circ from this am/ baby asleep last fed at 1251)  Baby is 57 hours old , asleep on single photo tx. In the crib .  Mom mentioned she wasn't sure if the flanges were the right fit since her nipples were sore.  Requested that [facility] check her nipples . LC assessed tissue and compared to yesterday the left areola  More compressible, still some edema, and the right semi compressible with edema.  Mom also has generalized edema .  LC observed mom pumping both breast are the same time with this the DEBP , started with #24 F . A dab of coconut oil and  Per mom comfortable, once the pump cycled around in the initiation mode  and jumped up 5 mins into pumping mom felt the #24 Flanges Were uncomfortable. LC changed to the #27 F appeared to be large for size of the mom tissue, per mom more comfortable. LC recommended to continue wearing the breast shells between feedings except when sleeping and pumping  More consistent both breast and as the hormones increase the areola edema should improve.  [place] praised mom for her efforts pumping.  Per mom grandmother bought mom a DEBP Medela.      Maternal Data Has patient been taught Hand Expression?: Yes(LC reviewed prior to pumping and mom able to repeat demo)  Feeding Feeding Type: Formula Nipple Type: Slow - flow  LATCH Score                   Interventions Interventions: Breast feeding basics reviewed;Coconut oil;Shells;DEBP;Hand pump  Lactation Tools Discussed/Used Tools: Shells;Pump;Coconut oil;Flanges Flange Size: 24;27(started with the #24 F and switched to the #27 F due to mom hurting) Shell Type: Inverted Breast pump type: Double-Electric Breast Pump;Manual Pump  Review: Setup, frequency, and cleaning;Milk Storage(LC reviewed with mom and check flanges)   Consult Status Consult Status: Follow-up Date: 05/17/19 Follow-up type: In-patient    [place] 05/16/2019, 3:48 PM

## 2019-05-17 ENCOUNTER — Ambulatory Visit: Payer: Self-pay

## 2019-05-17 NOTE — Lactation Note (Signed)
This note was copied from a baby's chart. Lactation Consultation Note  Patient Name: Boy Paeton Latouche INOMV'E Date: 05/17/2019 Reason for consult: Follow-up assessment;Engorgement Baby is 10 hours old.  Phototherapy discontinued and baby will be discharged.  Mom has bilateral severe engorgement.  She states it got bad last night and she is very uncomfortable.  RN gave her ice packs this morning but she has not pumped in 5 hours.  She last pumped 20 mls from left and nothing from right.  Reviewed engorgement and treatment.  Mom is anxious and relaxation encouraged.  Coconut oil applied to breast so breast massage can be done during pumping.  Assisted with pumping.  Mom was using 36 mm flanges which are too large.  27 mm flanges used and appropriate.  I massaged breasts for 25 minutes during pumping.  15 mls obtained from left but no milk expressed from right.  Reassured mom and discussed importance of ice between pumping, taking ibuprofen as directed, lying flat between pumping and massaging breasts toward armpit and pumping every 1 1/2-2 hours.  I explained this is temporary but she may not see improvement until tomorrow.  Recommended mom call for an outpatient appointment for feeding assist.  Nipple shield given with instructions for right side which is inverted.  Maternal Data    Feeding    LATCH Score                   Interventions    Lactation Tools Discussed/Used     Consult Status Consult Status: Complete Follow-up type: Call as needed    Ave Filter 05/17/2019, 11:46 AM

## 2019-05-17 NOTE — Lactation Note (Signed)
This note was copied from a baby's chart. Lactation Consultation Note  Patient Name: Whitney Gonzalez XFPKG'Y Date: 05/17/2019 Reason for consult: Follow-up assessment;Engorgement Mom called out for assist with pumping.  Breasts remain engorged but slightly softer since using ice and massaging back while lying flat.  Assisted with massage during pumping.  She obtained 32 mls from left and 2 mls from right.  Mom will continue to follow plan at home and call with questions or concerns.  Maternal Data    Feeding    LATCH Score                   Interventions    Lactation Tools Discussed/Used     Consult Status Consult Status: Complete Follow-up type: Call as needed    Ave Filter 05/17/2019, 2:33 PM

## 2019-05-21 ENCOUNTER — Telehealth: Payer: Self-pay | Admitting: Family Medicine

## 2019-05-21 NOTE — Telephone Encounter (Signed)
Spoke with patient about her appointment on 6/18 @ 9:00. Patient was instructed to wear a face mask and no visitors are allowed with her. Patient was screened for covid symptoms and denied having any

## 2019-05-22 ENCOUNTER — Telehealth: Payer: Medicaid Other | Admitting: Obstetrics and Gynecology

## 2019-05-22 ENCOUNTER — Ambulatory Visit (INDEPENDENT_AMBULATORY_CARE_PROVIDER_SITE_OTHER): Payer: Medicaid Other | Admitting: General Practice

## 2019-05-22 ENCOUNTER — Other Ambulatory Visit: Payer: Self-pay

## 2019-05-22 VITALS — BP 129/93 | HR 88

## 2019-05-22 DIAGNOSIS — Z0130 Encounter for examination of blood pressure without abnormal findings: Secondary | ICD-10-CM

## 2019-05-22 NOTE — Progress Notes (Signed)
Patient presents to office today for blood pressure check. Delivered vaginally on 6/10- induced at 37 weeks for gestational HTN. Patient was sent home on Norvasc 10mg . Patient denies headaches, dizziness, or blurry vision. States she has checked her BP once she has been home and it was 127/83. Reviewed BP with Dr Roselie Awkward who states patient can continue medication at home, no further intervention needed at this time. Discussed with patient checking BP every couple days and contacting us if it is elevated or she becomes symptomatic. Patient verbalized understanding & had no questions. Patient will follow up at pp visit.  Koren Bound RN BSN 05/22/19

## 2019-05-23 NOTE — Progress Notes (Signed)
Patient ID: Whitney Gonzalez, female   DOB: June 16, 1991, 28 y.o.   MRN: [id number] I have reviewed the chart and agree with nursing staff's documentation of this patient's encounter.  Emeterio Reeve, MD 05/23/2019 3:03 PM

## 2019-05-26 NOTE — Progress Notes (Signed)
Patient ID: Whitney Gonzalez, female   DOB: [birth year], 28 y.o.   MRN: [id number] I have reviewed the chart and agree with nursing staff's documentation of this patient's encounter.  Emeterio Reeve, MD 05/26/2019 10:17 PM

## 2019-05-28 ENCOUNTER — Ambulatory Visit: Payer: Self-pay

## 2019-05-28 NOTE — Lactation Note (Signed)
This note was copied from a baby's chart. Lactation Consultation Note  Patient Name: Castella Lerner TIWPY'K Date: 05/28/2019     05/28/2019  Name: Pamelyn Bancroft MRN: [id number] Date of Birth: 05/14/2018 Gestational Age: Gestational Age: [redacted]w[redacted]d Birth Weight: 108 oz Weight today:    6 pounds 15.7 ounces (3166 grams) with clean newborn diaper  69 week old ET  infant presents today with mom and MGM for feeding assessment. Mom reports infant has not been BF well.   Infant has gained 271 grams in the last 11 days with an average daily weight gain of 25 grams a day.   Infant is not latching well to the breast. Mom has not been attempting recently as feedings have not going well at the breast.   Infant tends to be a snacker and feeds about every 2-3 hours. Infant is waking himself up to feed. Infant will take up to 45 minutes to take his bottle. Infant drooling on the Dr. Saul Fordyce Level 1 nipple. Enc family to try the Dr. Saul Fordyce Preemie Nipple.  Mom is pumping every 2 hours during the day and every 3 hours at night using the Medela. Mom using warm compresses and massaging area with pumping. She has about 100 ounces stored in the fridge.   Pt reports lump to left outer breast that changes in size with pumping. Discussed plugged ducts and Sunflower Lecithin to try. No fever or redness noted. Area is deep and mom reports she has dense breast tissue normally.   Infant with thick labial frenulum that inserts at the bottom of the gum ridge. Upper lip tight with flanging. Infant with posterior lingual frenulum with some decreased mid tongue elevation. Infant not able to sustain latch without the nipple shield. Infant with strong suckle with good tongue extension and cupping noted. Infant drooling on the Level 1 nipple. Will reassess at the next appt. Mom was informed infant has a lip restriction and possible posterior lingual frenulum. Some behaviors also characteristic of ET  infant.   Discussed with mom that infant feeding behavior is very common for ET infant and that typically infants improve with time and as they get closer to due date.   Infant to follow up with Pediatrician (Dr. Barbaraann Rondo) tomorrow. Infant to follow up with Lactation in 2 weeks. Mom aware of Virtual Breast feeding Support Groups.      General Information: Mother's reason for visit: Feeding assessment, ET infant Consult: Initial Lactation consultant: Ivin Booty Jeremi Losito RN,IBCLC Breastfeeding experience: not latching currently Maternal medical conditions: Pregnancy induced hypertension, Other(Endometriosis, cystitis) Maternal medications: Pre-natal vitamin, Other(Celexa, Norvasc)  Breastfeeding History: Frequency of breast feeding: not latching    Supplementation: Supplement method: bottle(Dr. Brown's Level 1)         Breast milk volume: 3 ounces Breast milk frequency: every 2-3 hours   Pump type: (Medela Sonata) Pump frequency: every 2 hours during the day and every 3 hours at night Pump volume: 4-7 ounces  Infant Output Assessment: Voids per 24 hours: 8+ Urine color: Clear yellow Stools per 24 hours: 8+ Stool color: Yellow  Breast Assessment: Breast: Soft, Compressible Nipple: Erect(Invaginated in the center) Pain level: 1 Pain interventions: Bra, Breast pump, Nipple shield, Expressed breast milk  Feeding Assessment: Infant oral assessment: Variance Infant oral assessment comment: see note Positioning: Football(right breast, 15 minutes) Latch: 1 - Repeated attempts needed to sustain latch, nipple held in mouth throughout feeding, stimulation needed to elicit sucking reflex. Audible swallowing: 2 - Spontaneous and intermittent  Type of nipple: 2 - Everted at rest and after stimulation Comfort: 2 - Soft/non-tender Hold: 1 - Assistance needed to correctly position infant at breast and maintain latch LATCH score: 8 Latch assessment: Deep Lips flanged: Yes Suck assessment:  Displays both Tools: Nipple shield 24 mm, Syringe with 5 Fr feeding tube, Pump Pre-feed weight: 3166 grams Post feed weight: 3176 grams Amount transferred: 0 Amount supplemented: 10 ml  Additional Feeding Assessment: Infant oral assessment: Variance Infant oral assessment comment: see note Positioning: Cross cradle(left breast, 10 minutes) Latch: 1 - Repeated attempts neede to sustain latch, nipple held in mouth throughout feeding, stimulation needed to elicit sucking reflex. Audible swallowing: 1 - A few with stimulation Type of nipple: 2 - Everted at rest and after stimulation Comfort: 1 - Filling, red/small blisters or bruises, mild/mod discomfort Hold: 1 - Assistance needed to correctly position infant at breast and maintain latch LATCH score: 6 Latch assessment: Deep Lips flanged: Yes Suck assessment: Displays both Tools: Nipple shield 24 mm Pre-feed weight: 3176 grams Post feed weight: 3214 grams Amount transferred: 1 Amount supplemented: 36 ml via SNS  Totals: Total amount transferred: 1 ml Total supplement given: 46 ml     Donn Pierini RN, IBCLC                                                      Donn Pierini 05/28/2019, 2:18 PM

## 2019-05-29 ENCOUNTER — Telehealth: Payer: Medicaid Other | Admitting: Obstetrics & Gynecology

## 2019-06-02 ENCOUNTER — Telehealth: Payer: Self-pay

## 2019-06-02 MED ORDER — DOCUSATE SODIUM 100 MG PO CAPS: 100.0000 mg | ORAL_CAPSULE | Freq: Two times a day (BID) | ORAL | 1 refills | Status: DC

## 2019-06-02 NOTE — Telephone Encounter (Signed)
Pt is calling for refills on medications prescribed in hospital, stool softer anxiety medicine and blood pressure. Patient is requesting a call back to see if this is possible because she states that the bottles say no refills

## 2019-06-02 NOTE — Addendum Note (Signed)
Addended by: Langston Reusing on: 06/02/2019 05:27 PM   Modules accepted: Orders

## 2019-06-02 NOTE — Telephone Encounter (Addendum)
Call returned to pt. She stated she is out of Colace but still has about 1 week left of Norvasc and Celexa. Per chart review, pt has scheduled PP appt on 7/13. I advised that I will send in refill for Colace today. I will need to find out from provider if the other meds should be refilled now or wait until her scheduled appt on 7/13. I will then send her a MyChart message tomorrow letting her know if the other meds are refilled now or wait until her appt on 7/13. Pt voiced understanding.   7/1  Message received from [facility] with direction to refill Rx Celexa and to hold refill of Norvasc until pt has scheduled visit on 7/13. Rx for Celexa e-prescribed. MyChart message sent to pt with this updated information.

## 2019-06-04 ENCOUNTER — Encounter: Payer: Self-pay | Admitting: *Deleted

## 2019-06-04 MED ORDER — CITALOPRAM HYDROBROMIDE 10 MG PO TABS: 10.0000 mg | ORAL_TABLET | Freq: Every day | ORAL | 2 refills | Status: DC

## 2019-06-04 NOTE — Addendum Note (Signed)
Addended by: Langston Reusing on: 06/04/2019 08:44 AM   Modules accepted: Orders

## 2019-06-05 ENCOUNTER — Encounter: Payer: Medicaid Other | Admitting: Obstetrics and Gynecology

## 2019-06-05 ENCOUNTER — Other Ambulatory Visit: Payer: Self-pay | Admitting: Obstetrics and Gynecology

## 2019-06-05 ENCOUNTER — Other Ambulatory Visit: Payer: Self-pay | Admitting: Lactation Services

## 2019-06-05 ENCOUNTER — Telehealth: Payer: Self-pay | Admitting: Lactation Services

## 2019-06-05 ENCOUNTER — Telehealth: Payer: Self-pay | Admitting: Family Medicine

## 2019-06-05 DIAGNOSIS — N644 Mastodynia: Secondary | ICD-10-CM

## 2019-06-05 DIAGNOSIS — N63 Unspecified lump in unspecified breast: Secondary | ICD-10-CM

## 2019-06-05 MED ORDER — CLINDAMYCIN HCL 150 MG PO CAPS: 450.0000 mg | ORAL_CAPSULE | Freq: Three times a day (TID) | ORAL | 0 refills | Status: DC

## 2019-06-05 NOTE — Telephone Encounter (Signed)
Pt reports pain to outer left breast. And now she has warmth to the right breast. Temp 101.3-100.2. pt is feeding pretty bad and feels like she has the flu. Lump has been present for over a week and since engorgement early on. Breast is warm to the touch.   Pt reports she is emptying the breast every 3 hours. She is getting about 7 ounces per pumping. The left side amount has decreased some.   She is alternating tylenol and Ibuprofen.   Spoke with Dr. Rip Harbour and he ordered ATB for 7 days and an [place]

## 2019-06-05 NOTE — Progress Notes (Signed)
Called and spoke with the [facility] to schedule a left breast US to rule out Abscess. Pt will be notified via [place]. Appt is 7/7 @ 08:30 at the [facility].

## 2019-06-05 NOTE — Telephone Encounter (Signed)
Patient called and stated she is having flu like symptoms, along with pain and hot breast on left side.

## 2019-06-10 ENCOUNTER — Ambulatory Visit
Admission: RE | Admit: 2019-06-10 | Discharge: 2019-06-10 | Disposition: A | Discharge status: Home / Self Care | Payer: Medicaid Other | Source: Ambulatory Visit | Attending: Obstetrics and Gynecology | Admitting: Obstetrics and Gynecology

## 2019-06-10 ENCOUNTER — Other Ambulatory Visit: Payer: Self-pay

## 2019-06-10 DIAGNOSIS — N644 Mastodynia: Secondary | ICD-10-CM

## 2019-06-11 ENCOUNTER — Telehealth: Payer: Medicaid Other | Admitting: Nurse Practitioner

## 2019-06-12 ENCOUNTER — Ambulatory Visit: Payer: Self-pay

## 2019-06-12 NOTE — Lactation Note (Signed)
This note was copied from a baby's chart. Lactation Consultation Note  Patient Name: Stormey Wilborn HUDJS'H Date: 06/12/2019     06/12/2019  Name: Marc Sivertsen MRN: [id number] Date of Birth: 05/14/2018 Gestational Age: Gestational Age: [redacted]w[redacted]d Birth Weight: 108 oz Weight today:    8 pounds 0.8 ounces (3650 grams) with clean newborn diaper  61 week old infant presents today with  Mom for follow up feeding assessment.   Infant has gained 484 grams in the last 15 days with an average daily weight gain of 32 grams a day.   Infant self awakens to feed every 2-3 hours in 24 hours. Infant has not been latching to the breast as mom was so sensitive since having Mastitis. Infant has been bottle feeding. Mom has been pumping every 2-3 hours and gets about 4-9 ounces per pumping. Infant was changed to the Dr. Saul Fordyce Preemie Nipple and reports less drooling on the bottle.    Mom was treated for Mastitis for 1 week. She is having pain in her right breast today. She is noting a lump in her armpit that needs massage with pumping. She has another prescription to continue since her symptoms are not completely gone. Pt is using the Sunflower Lecithin 4 x a day. [place] was indicative of mastitis and dense breast tissue.   Enc mom to avoid the Lanolin right now while on ATB and with noting she has cracks on the nipple. Recommended using Boob Ease or Earth Mama Nipple Butter.   Infant with thick labial frenulum that inserts at the bottom of the gum ridge. Upper lip tight with flanging. Infant with posterior lingual frenulum with some decreased mid tongue elevation. Infant not able to sustain latch without the nipple shield. Infant with strong suckle with good tongue extension and cupping noted. Will reassess at the next feeding.   Infant latched to the left breast in the cross cradle hold. Infant was not able to be latched to the breast without the NS. # 24 NS applied and infant latched.  Infant sleepy at the breast. Enc mom to continue to try the 5 french feeding tube, priming NS and stimulating infant to stay awake at the breast. Enc mom to continue pumping to empty the breast after infant feeds and offer supplement as needed. Infant fed some before appt as he was hungry per mom.   Infant to follow up with Dr. Barbaraann Rondo at 2 months. Infant to follow up with Lactation in 2 weeks.      General Information: Mother's reason for visit: Follow up feeding assessment Consult: Follow-up Lactation consultant: Nonah Mattes RN,IBCLC Breastfeeding experience: not latching currently due to pain and discomfort from Mastitis Maternal medical conditions: Pregnancy induced hypertension Maternal medications: Pre-natal vitamin(Novasc, Celexa)  Breastfeeding History: Frequency of breast feeding: not latching currently    Supplementation: Supplement method: bottle(Dr. Brown's Preemie Nipple)         Breast milk volume: 3.5-4 ounces with occasional 5 ounces Breast milk frequency: every 2-3 hours   Pump type: (Medela Sonata) Pump frequency: every 2-3 hours Pump volume: 4-9 ounces  Infant Output Assessment: Voids per 24 hours: 8+ Urine color: Clear yellow Stools per 24 hours: 8+ Stool color: Yellow  Breast Assessment: Breast: Soft, Compressible Nipple: Erect Pain level: 6(with initial pumping and decreases to 4 after getting started) Pain interventions: Bra, Lanolin, All purpose nipple cream, Inverted shells, Nipple shield, Breast pump  Feeding Assessment: Infant oral assessment: Variance Infant oral assessment comment: see note  Positioning: Armed forces logistics/support/administrative officer: 1 - Repeated attempts needed to sustain latch, nipple held in mouth throughout feeding, stimulation needed to elicit sucking reflex. Audible swallowing: 1 - A few with stimulation Type of nipple: 2 - Everted at rest and after stimulation Comfort: 1 - Filling, red/small blisters or bruises, mild/mod discomfort Hold: 1 -  Assistance needed to correctly position infant at breast and maintain latch LATCH score: 6 Latch assessment: Deep Lips flanged: Yes Suck assessment: Displays both Tools: Nipple shield 24 mm Pre-feed weight: 3650 grams Post feed weight: 3686 grams Amount transferred: 36 ml Amount supplemented: mom to supplement at home  Additional Feeding Assessment:   Infant oral assessment comment: see note                                Totals: Total amount transferred: 36 ml Total supplement given: mom to supplement Total amount pumped post feed: did not pump  1. Offer infant the breast with feeding cues. If not eating actively limit breast feeding to 20 minutes and then offer a bottle of pumped breast milk. Attempt to breast feed about 4 x a day if possible 2. Keep infant awake at the breast as needed 3. Massage/compress breast with feeding if he is sleepy at the breast 4. Use the # 24 Nipple Shield with feedings as needed. Try each day without the nipple shield to see when infant is able to nurse without it. The goal is to wean off the Nipple shield as soon as he can. 5. Empty one breast before offering the second breast 6. Can try the 5 french feeding tube at the breast with breast milk in the syringes.  7. If he is not taking his supplement at the breast, offer infant a bottle of expressed breast milk 8. Feed infant using the paced bottle feeding method (video on [email or web address].[email or web address]) 9. Burp infant frequently with feedings 10. Infant needs about  68-90 ml (2.5-3 ounces) for 8 feedings a day or 540-720 ml (18-24 ounces) in 24 hours. Infant may take more or less depending on how often he feeds. Feed infant until he is satisfied. 11. Continue pumping about 7-8 x a day for 15-20 minutes. Double pumping increases your pro lactin levels. Massage breast with pumping to help empty the breast. 12. Stop using the Lanolin and try either Boob Ease at [employer] or Earth Mama Nipple Butter that is  Lanolin free.  13. Continue Sunflower Lecithin may be helpful. They typical dosage is 1200 mg 4 x a day (Breakfast, lunch, dinner and bedtime) and then can decrease to 2 capsules a day.  14. Keep up the good work 53. Follow up with Lactation in 2 weeks  Debby Freiberg. Awanda Wilcock RN, IBCLC                                                      Debby Freiberg Benny Henrie 06/12/2019, 1:37 PM

## 2019-06-13 ENCOUNTER — Telehealth: Payer: Self-pay | Admitting: Advanced Practice Midwife

## 2019-06-13 NOTE — Telephone Encounter (Signed)
Attempted to call patient about her appointment on 7/13 @ 8:35. No answer, left voicemail instructing patient that this visit is a mychart appointment. Patient instructed to download the app if not already done. Patient instructed to download the [facility] app for her appointment with Roselyn Reef on 7/13 @ 9:00. Patient instructed to give the office a call back if needing assistance with the apps.

## 2019-06-16 ENCOUNTER — Ambulatory Visit: Payer: Medicaid Other | Admitting: Clinical

## 2019-06-16 ENCOUNTER — Other Ambulatory Visit: Payer: Self-pay

## 2019-06-16 ENCOUNTER — Telehealth: Payer: Medicaid Other | Admitting: Advanced Practice Midwife

## 2019-06-16 DIAGNOSIS — Z91199 Patient's noncompliance with other medical treatment and regimen due to unspecified reason: Secondary | ICD-10-CM

## 2019-06-16 DIAGNOSIS — Z5329 Procedure and treatment not carried out because of patient's decision for other reasons: Secondary | ICD-10-CM

## 2019-06-16 NOTE — BH Specialist Note (Signed)
Pt did not arrive to [facility] video visit, and did not answer the phone. Pt's phone voicemail is full; left MyChart message for patient.   Integrated Behavioral Health via Telemedicine Video Visit  06/16/2019 Whitney Gonzalez [id number]  [name]

## 2019-06-16 NOTE — Progress Notes (Signed)
Patient did not answer for her visit today.  Marcille Buffy DNP, CNM  06/16/19  8:46 AM

## 2019-06-16 NOTE — Progress Notes (Signed)
@  830am no answer lvm that I would call her back in 5-10 min's and if I happen to not reach her then to call our office back to reschedule @840am  message stating that the voice mail box is full. To check the number and try again.

## 2019-06-17 ENCOUNTER — Telehealth: Payer: Medicaid Other

## 2019-06-19 ENCOUNTER — Other Ambulatory Visit: Payer: Self-pay

## 2019-06-19 ENCOUNTER — Encounter: Payer: Self-pay | Admitting: Medical

## 2019-06-19 ENCOUNTER — Ambulatory Visit (INDEPENDENT_AMBULATORY_CARE_PROVIDER_SITE_OTHER): Payer: Medicaid Other | Admitting: Clinical

## 2019-06-19 ENCOUNTER — Telehealth (INDEPENDENT_AMBULATORY_CARE_PROVIDER_SITE_OTHER): Payer: Medicaid Other | Admitting: Medical

## 2019-06-19 DIAGNOSIS — Z8619 Personal history of other infectious and parasitic diseases: Secondary | ICD-10-CM

## 2019-06-19 DIAGNOSIS — F319 Bipolar disorder, unspecified: Secondary | ICD-10-CM

## 2019-06-19 DIAGNOSIS — Z1389 Encounter for screening for other disorder: Secondary | ICD-10-CM | POA: Diagnosis not present

## 2019-06-19 DIAGNOSIS — Z8759 Personal history of other complications of pregnancy, childbirth and the puerperium: Secondary | ICD-10-CM | POA: Insufficient documentation

## 2019-06-19 HISTORY — DX: Personal history of other complications of pregnancy, childbirth and the puerperium: Z87.59

## 2019-06-19 HISTORY — DX: Personal history of other infectious and parasitic diseases: Z86.19

## 2019-06-19 NOTE — Progress Notes (Signed)
I connected with Whitney Gonzalez on 06/19/19 at  2:35 PM EDT by: MyChart and verified that I am speaking with the correct person using two identifiers.  Patient is located at home and provider is located at [facility].     The purpose of this virtual visit is to provide medical care while limiting exposure to the novel coronavirus. I discussed the limitations, risks, security and privacy concerns of performing an evaluation and management service by MyChart and the availability of in person appointments. I also discussed with the patient that there may be a patient responsible charge related to this service. By engaging in this virtual visit, you consent to the provision of healthcare.  Additionally, you authorize for your insurance to be billed for the services provided during this visit.  The patient expressed understanding and agreed to proceed.  The following staff members participated in the virtual visit:  Carver Fila, [place] Visit Note Subjective:    Whitney Gonzalez is a 28 y.o. [phone] female who presents for a postpartum visit. She is 5 weeks postpartum following a spontaneous vaginal delivery. I have fully reviewed the prenatal and intrapartum course. The delivery was at 18 gestational weeks. IOL for [facility].  Outcome: spontaneous vaginal delivery. Anesthesia: epidural. Postpartum course has been complicated by mastitis, treated. Baby's course has been normal, aside from a viral URI. Baby is feeding by breast. Bleeding staining only. Bowel function is normal. Bladder function is normal. Patient is not sexually active. Contraception method is abstinence. Postpartum depression screening: negative.  The following portions of the patient's history were reviewed and updated as appropriate: allergies, current medications, past family history, past medical history, past social history, past surgical history and problem list.  Review of Systems Pertinent items are noted in HPI.    Objective:   Vitals:   06/19/19 1432  BP: 117/74  Pulse: 89   Self-Obtained       Assessment:    Normal postpartum exam. Pap smear not done at today's visit. Last pap smear 2019 and results were normal. Next pap due 2022.   Plan:    1. Contraception: oral progesterone-only contraceptive 2. Follow up in: 1 year or as needed.   Non face-to-face time spent 18 minutes  Danielle Rankin 06/19/2019 2:58 PM

## 2019-06-19 NOTE — BH Specialist Note (Signed)
Integrated Behavioral Health via Telemedicine Video Visit  06/19/2019 GWYNNETH Gonzalez [id number]  Number of [place] visits: 2 Session Start time: 1:02  Session End time: 1:28 Total time: 20 minutes  Referring Provider: Earlie Server, NP, at previous visit Type of Visit: Video Patient/Family location: Home [facility] Provider location: [facility] All persons participating in visit: [name] and [place]  Confirmed patient's address: Yes  Confirmed patient's phone number: Yes  Any changes to demographics: No   Confirmed patient's insurance: Yes  Any changes to patient's insurance: No   Discussed confidentiality: At previous visit  I connected with Carlyn Reichert a video enabled telemedicine application and verified that I am speaking with the correct person using two identifiers.     I discussed the limitations of evaluation and management by telemedicine and the availability of in person appointments.  I discussed that the purpose of this visit is to provide behavioral health care while limiting exposure to the novel coronavirus.   Discussed there is a possibility of technology failure and discussed alternative modes of communication if that failure occurs.  I discussed that engaging in this video visit, they consent to the provision of behavioral healthcare and the services will be billed under their insurance.  Patient and/or legal guardian expressed understanding and consented to video visit: Yes   PRESENTING CONCERNS: Patient and/or family reports the following symptoms/concerns: Pt states she has been feeling better postpartum by taking her medication and using self-coping strategies; some anxiety, but feels it is manageable at this time.  Duration of problem: Ongoing, increase anxiety postpartum; Severity of problem: mild  STRENGTHS (Protective Factors/Coping Skills): Self-aware, coping skills, strong social  support  GOALS ADDRESSED: Patient will: 1.  Reduce symptoms of: anxiety  2.  Increase knowledge and/or ability of: healthy habits  3.  Demonstrate ability to: Increase healthy adjustment to current life circumstances, Increase adequate support systems for patient/family and Increase motivation to adhere to plan of care  INTERVENTIONS: Interventions utilized:  Medication Monitoring, Psychoeducation and/or Health Education and Link to [employer] Standardized Assessments completed: Not Needed  ASSESSMENT: Patient currently experiencing Bipolar I disorder (as previously diagnosed)  Patient may benefit from continued brief therapeutic interventions today.  PLAN: 1. Follow up with behavioral health clinician on : As needed, if symptoms increase 2. Behavioral recommendations:  -Continue taking [place] medication, as prescribed; if unable to obtain new PCP appointment by end August 2020, send MyChart message to Roselyn Reef [facility]) -Continue working with Medicaid worker to find new PCP -Consider attending at least one new mom online support group at either [email or web address] or [email or web address] -Continue to prioritize healthy sleeping and eating  3. Referral(s): Integrated Orthoptist (In Clinic) and [employer] Resources:  New mom support  I discussed the assessment and treatment plan with the patient and/or parent/guardian. They were provided an opportunity to ask questions and all were answered. They agreed with the plan and demonstrated an understanding of the instructions.   They were advised to call back or seek an in-person evaluation if the symptoms worsen or if the condition fails to improve as anticipated.  Caroleen Hamman [facility]  Depression screen [facility] 2/9 04/01/2019 01/30/2019 01/02/2019 12/23/2018 12/02/2018  Decreased Interest 1 0 0 0 0  Down, Depressed, Hopeless 1 0 0 0 0  PHQ - 2 Score 2 0 0 0 0  Altered sleeping 3 1 1 1 1   Tired, decreased energy 2 1 1 1 2   Change  in appetite 0  0 0 0 0  Feeling bad or failure about yourself  1 0 0 0 0  Trouble concentrating 0 0 0 0 0  Moving slowly or fidgety/restless 0 0 0 0 0  Suicidal thoughts 0 - 0 0 0  PHQ-9 Score 8 2 2 2 3   Some encounter information is confidential and restricted. Go to Review Flowsheets activity to see all data.  Some recent data might be hidden   GAD 7 : Generalized Anxiety Score 04/01/2019 01/30/2019 01/02/2019 12/23/2018  Nervous, Anxious, on Edge 2 1 1 1   Control/stop worrying 3 0 1 1  Worry too much - different things 3 1 1 1   Trouble relaxing 2 0 0 0  Restless 0 0 0 0  Easily annoyed or irritable 1 1 0 1  Afraid - awful might happen 1 0 0 0  Total GAD 7 Score 12 3 3 4   Some encounter information is confidential and restricted. Go to Review Flowsheets activity to see all data.

## 2019-06-19 NOTE — Patient Instructions (Signed)

## 2019-06-30 ENCOUNTER — Ambulatory Visit: Payer: Self-pay

## 2019-06-30 NOTE — Lactation Note (Signed)
This note was copied from a baby's chart. Lactation Consultation Note  Patient Name: Whitney Gonzalez SEGBT'D Date: 06/30/2019     06/30/2019  Name: Whitney Gonzalez MRN: [id number] Date of Birth: 05/14/2018 Gestational Age: Gestational Age: [redacted]w[redacted]d Birth Weight: 108 oz Weight today:    9 pounds 9.3 ounces (4346 grams) with clean newborn diaper  4 week old infant presents today with mom for follow up feeding assessment.   Infant has gained 696 grams in the last 16 days with an average daily weight gain of 43 grams a day.   Infant is self awakening to feed every 2-3 hours. He is not consistently latching to the breast and will feed about  Mom is pumping about every 2-4 hours and gets about 9-10 ounces per pumping. The amount is much less when infant nurses.   Infant is being bottle fed with about every feeding and is taking 4 ounces at at time with the Dr. Saul Fordyce Preemie nipple.   Infant with thick labial frenulum that inserts at the bottom of the gum ridge. Upper lip tight with flanging. Infant with sucking blister to center upper lip. Infant with posterior lingual frenulum with some decreased mid tongue elevation. Infant not able to sustain latch without the nipple shield. Mom reports pain when infant latches without the NS and initially when latching to the breast with the nipple shield. Infant with strong suckle with good tongue extension and cupping noted. mom reports infant does well eating with the preemie nipple. Infant is sleepy with feeding. Mom would very much like to solely BF infant. Reviewed how tongue and lip restrictions can effect milk transfer, causing plugged ducts, and can effect milk supply. Mom given website information and local provider list.   Mom reports she is over her mastitis and when she feels knots she takes a warm shower and notices a difference. Mom using # 27 flanges and reports she tried the # 24 previously and reports she did not get  enough. Mom with perfect circle around her nipple/areola that looks like pump damage. Enc mom to try her # 24 NS again. Mom reports infant will not latch to the right breast, even with the nipple shield. The right breast milk supply is increasing.   Mom voiced that she is exhausted and is thinking whether she want to give up on BF. Reviewed that she has worked very hard and it doing a Chief Technology Officer job at feeding infant and protecting milk supply.   Mom doing some self care activities and good support at home.   Infant has changed Pediatricians to Dr Jeannine Kitten at [place]. Infant to follow up with Ped at 2 months. Infant to follow up with Lactation in 1 month or 1-5 days post tongue and lip releases if completed.    General Information: Mother's reason for visit: Follow up feeding assessment Consult: Follow-up Lactation consultant: Nonah Mattes RN,IBCLC Breastfeeding experience: Bf with some feedings, not consistently BF. infant being supplemented with feedings Maternal medical conditions: Pregnancy induced hypertension Maternal medications: Pre-natal vitamin(Celexa)  Breastfeeding History: Frequency of breast feeding: every 2-3 hours Duration of feeding: 30 + minutes  Supplementation: Supplement method: bottle(Dr. Brown's Preemie nipple)         Breast milk volume: 4 ounces Breast milk frequency: every 2-3 hours   Pump type: (Medela Sonata) Pump frequency: every 3 hours Pump volume: 6-10 ounces  Infant Output Assessment: Voids per 24 hours: 8+ Urine color: Clear yellow Stools per 24 hours: 8+  Stool color: Yellow  Breast Assessment: Breast: Soft, Compressible Nipple: Erect   Pain interventions: Bra, Other, Breast pump, Nipple shield(Boobease)  Feeding Assessment: Infant oral assessment: Variance Infant oral assessment comment: see note Positioning: Cross cradle Latch: 1 - Repeated attempts needed to sustain latch, nipple held in mouth throughout feeding, stimulation needed  to elicit sucking reflex. Audible swallowing: 2 - Spontaneous and intermittent Type of nipple: 2 - Everted at rest and after stimulation Comfort: 1 - Filling, red/small blisters or bruises, mild/mod discomfort Hold: 2 - No assistance needed to correctly position infant at breast LATCH score: 8 Latch assessment: Deep Lips flanged: No Suck assessment: Displays both Tools: Nipple shield 24 mm Pre-feed weight: 4346 grams Post feed weight: 4372 grams Amount transferred: 26 ml Amount supplemented: fed prior to appt  Additional Feeding Assessment:                                    Totals: Total amount transferred: 26 ml Total supplement given: fed prior to appt Total amount pumped post feed: did not pump  1. Offer infant the breast with feeding cues. If not eating actively limit breast feeding to 20 minutes and then offer a bottle of pumped breast milk. Attempt to breast feed about 4 x a day if possible 2. Keep infant awake at the breast as needed 3. Massage/compress breast with feeding if he is sleepy at the breast 4. Use the # 24 Nipple Shield with feedings as needed. Try each day without the nipple shield to see when infant is able to nurse without it. The goal is to wean off the Nipple shield as soon as he can. 5. Empty one breast before offering the second breast 6. Offer infant about 1/2 to 1 ounce of milk in the bottle if he is frustrated at the breast  7. If he is not taking his supplement at the breast, offer infant a bottle of expressed breast milk after breast feeding 8. Feed infant using the paced bottle feeding method (video on [email or web address].com) 9. Burp infant frequently with feedings 10. Infant needs about 80-108 ml (3.5 ounces) for 8 feedings a day or 645-860 ml (22-28 ounces) in 24 hours. Infant may take more or less depending on how often he feeds. Feed infant until he is satisfied. 11. Continue pumping about 7-8 x a day for 15-20 minutes. Double pumping  increases your pro lactin levels. Massage breast with pumping to help empty the breast. 12. Stop using the Lanolin and try either Boob Ease at [employer] or Earth Mama Nipple Butter that is Lanolin free.  13. Continue Sunflower Lecithin may be helpful. They typical dosage is 1200 mg 4 x a day (Breakfast, lunch, dinner and bedtime) and then can decrease to 2 capsules a day.  14. Keep up the good work 67. Follow up with Lactation in 4 weeks or 1-5 days post tongue and lip releases  [place], IBCLC                                                   Debby Freiberg Emalea Mix 06/30/2019, 2:24 PM

## 2019-07-16 ENCOUNTER — Telehealth: Payer: Self-pay | Admitting: Obstetrics and Gynecology

## 2019-07-16 NOTE — Telephone Encounter (Signed)
Spoke with patient about her appointment on 8/13 @ 9:35. Patient instructed to wear a face mask for the entire visit and no visitors are allowed. Patient screened for covid symptoms and denied having any.

## 2019-07-17 ENCOUNTER — Ambulatory Visit (INDEPENDENT_AMBULATORY_CARE_PROVIDER_SITE_OTHER): Payer: Medicaid Other | Admitting: Obstetrics and Gynecology

## 2019-07-17 ENCOUNTER — Encounter: Payer: Self-pay | Admitting: Obstetrics and Gynecology

## 2019-07-17 ENCOUNTER — Other Ambulatory Visit: Payer: Self-pay | Admitting: Lactation Services

## 2019-07-17 ENCOUNTER — Other Ambulatory Visit: Payer: Self-pay

## 2019-07-17 VITALS — BP 128/72 | HR 74 | Wt 138.4 lb

## 2019-07-17 DIAGNOSIS — N644 Mastodynia: Secondary | ICD-10-CM

## 2019-07-17 DIAGNOSIS — Z0000 Encounter for general adult medical examination without abnormal findings: Secondary | ICD-10-CM | POA: Diagnosis not present

## 2019-07-17 MED ORDER — FLUCONAZOLE 150 MG PO TABS: 150.0000 mg | ORAL_TABLET | Freq: Once | ORAL | 0 refills | Status: DC

## 2019-07-17 NOTE — Progress Notes (Signed)
Saw pt at request of Noni Saupe, NP due to concerns with nipples.   Nipples are dark pink in color. Pt reports itching and burning to the nipples that started about 1 weeks ago. Pt reports she has burning shooting pains in the breasts that is after pumping or emptying the breast and sometimes when breasts filling. Pt has been on ATB recently for Mastitis.   Pt reports she is not latching infant to the breast as it is painful.   Reviewed treatment for Mother and infant with [place] Handout.   Advised  Good handwashing Apply Lotrimin AF or Monistat Cream to nipples after each pumping Wash burp cloths, bras and bra pads in hot soapy water and use hot dryer daily Boil all pump parts, pacifiers, and bottles daily for 20 minutes Have infant evaluated for thrush Diflucan for 14 days orally  Discussed that yeast is not killed by freezing of the milk and can recontaminate mom if used at a later time.   Spoke with Noni Saupe, NP  Who prescribed 14 day course of Diflucan for treatment.

## 2019-07-17 NOTE — Progress Notes (Signed)
papPapsmear and check up Wants referral for Rheumatology.
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
## 2019-08-06 DIAGNOSIS — M2550 Pain in unspecified joint: Secondary | ICD-10-CM | POA: Insufficient documentation

## 2019-08-06 DIAGNOSIS — R5382 Chronic fatigue, unspecified: Secondary | ICD-10-CM | POA: Insufficient documentation

## 2019-08-06 DIAGNOSIS — N96 Recurrent pregnancy loss: Secondary | ICD-10-CM | POA: Insufficient documentation

## 2019-08-06 HISTORY — DX: Recurrent pregnancy loss: N96

## 2019-08-07 ENCOUNTER — Ambulatory Visit: Payer: Self-pay

## 2019-08-07 NOTE — Lactation Note (Signed)
This note was copied from a baby's chart. Lactation Consultation Note  Patient Name: Whitney Gonzalez [id number] Date: 08/07/2019   Called Pt at 3:07 pm to speak with mother over the phone as she did not feel like she could come in today but still wanted to speak with Lactation. Pt did not answer and mailbox was full and could not leave a message. Will try again in 10-15 minutes.   Called pt back at 3:10 after she called the front desk.  Pt has tongue and lip releases 3 weeks ago by Dr. Ronny Flurry and has finished his follow up with Dr. Ronny Flurry. They have stopped the stretches.   Pt reports infant has been healing from the lip and tongue tie. Mom is trying to latch infant and infant pulls off and cries. Mom has tried the 5 french feeding tube and the infant will not stay latched. And then he will not latch. She has tried expressing milk prior to latching and in the bath tub for latching. Discussed giving infant an ounce of breast milk before latching infant to the breast to see if that will help.   Mom reports infant is bottle feeding better with less choking and drooling.   Mom is pumping every 3 hours and getting about 6-7 ounces on the left breast and 3-6 ounces on the right. She has been trying to space out pumpings to 4 hours at night and is tolerating that well. Discussed how to slowly space out pumpings to help decrease milk production some.   Pt reports she has some spots on her breasts. She reports they are on the side of both breasts and are reddened. She says they look like ingrown hairs at first and have improved since they first appeared. She reports they did look infected and there was a liquid that came out of them that clear and was very painful. One was hot and she felt like it had a bump under it, It has a clear liquid but is not painful anymore. The area also has some dryness and scaling to the area. She is putting lotion on the area. Pt reports she has some pitting in the  area near her armpits.  Pt reports she does not have a history of cold sores or genital herpes.  Pt with no history of eczema. She denies any new lotions but was using a new soap and went back to an antibacterial soap and notes that the lesions started drying and healing.   Pt stopped using the nipple cream on the nipples as it was more irritating. Pt reports her nipples have improved and not as inflamed. She saw another LC who recommended that she try pumping pals in varying sizes and reports they were more uncomfortable so she stopped using. She is looking into other options.   Pt is sending a picture of the breast areas via my chart so we can see what she is seeing.   Pt to call with any further questions/concerns as needed. Pt would like to follow up with Lactation when she can have someone come with her. Mom reports she is having difficulty with her hand and wrist and is being evaluated by a rheumatologist for what the problem may be.   Debby Freiberg Daylon Lafavor 08/07/2019, 3:05 PM

## 2019-08-22 ENCOUNTER — Other Ambulatory Visit: Payer: Self-pay | Admitting: Lactation Services

## 2019-08-22 MED ORDER — CLINDAMYCIN HCL 150 MG PO CAPS: 450.0000 mg | ORAL_CAPSULE | Freq: Three times a day (TID) | ORAL | 0 refills | Status: DC

## 2019-08-22 NOTE — Progress Notes (Signed)
Clindamycin ordered for Mastitis per Dr. Rip Harbour.

## 2019-08-25 ENCOUNTER — Inpatient Hospital Stay (HOSPITAL_COMMUNITY)
Admission: AD | Admit: 2019-08-25 | Discharge: 2019-08-25 | Disposition: A | Discharge status: Home / Self Care | Payer: Medicaid Other | Attending: Obstetrics and Gynecology | Admitting: Obstetrics and Gynecology

## 2019-08-25 ENCOUNTER — Encounter (HOSPITAL_COMMUNITY): Payer: Self-pay | Admitting: Emergency Medicine

## 2019-08-25 ENCOUNTER — Other Ambulatory Visit: Payer: Self-pay

## 2019-08-25 ENCOUNTER — Telehealth: Payer: Self-pay | Admitting: Family Medicine

## 2019-08-25 ENCOUNTER — Ambulatory Visit (HOSPITAL_COMMUNITY)
Admission: EM | Admit: 2019-08-25 | Discharge: 2019-08-25 | Disposition: A | Discharge status: Home / Self Care | Payer: Medicaid Other | Attending: Family Medicine | Admitting: Family Medicine

## 2019-08-25 ENCOUNTER — Telehealth (HOSPITAL_COMMUNITY): Payer: Self-pay | Admitting: Lactation Services

## 2019-08-25 ENCOUNTER — Ambulatory Visit (INDEPENDENT_AMBULATORY_CARE_PROVIDER_SITE_OTHER): Payer: Medicaid Other | Admitting: Women's Health

## 2019-08-25 VITALS — BP 128/83 | HR 76 | Temp 98.8°F | Wt 136.0 lb

## 2019-08-25 DIAGNOSIS — N644 Mastodynia: Secondary | ICD-10-CM | POA: Insufficient documentation

## 2019-08-25 DIAGNOSIS — R509 Fever, unspecified: Secondary | ICD-10-CM

## 2019-08-25 DIAGNOSIS — N61 Mastitis without abscess: Secondary | ICD-10-CM

## 2019-08-25 LAB — POCT PREGNANCY, URINE: Preg Test, Ur: NEGATIVE

## 2019-08-25 MED ORDER — CLINDAMYCIN HCL 150 MG PO CAPS: 450.0000 mg | ORAL_CAPSULE | Freq: Three times a day (TID) | ORAL | 0 refills | Status: DC
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
## 2019-08-25 NOTE — MAU Note (Signed)
.   Whitney Gonzalez is a 28 y.o. pt presents to MAU with complaints of mastitis in her right breast. States she was told to come to MAU  Del vaginally on 05/14/19  Onset of complaint: Friday  Pain score: 5 Vitals:   08/25/19 1053  BP: 135/85  Pulse: 67  Resp: 16  Temp: 98 F (36.7 C)      Lab orders placed from triage:

## 2019-08-25 NOTE — Telephone Encounter (Signed)
Fever started about 4 days ago. Has reached 100.6 at the peek. She been pumping every 2-3 hours per discussed. The right side seems to clog. She stated she normally pumps 6 oz but now she only pumps 2. She hasn't tried the cabbage yet because she doesn't want her breast to dry before the infection is gone. She would like a call back.

## 2019-08-25 NOTE — Discharge Instructions (Addendum)
Continue expressing milk from the right breast.  Continue the hot compresses (no ice).  Continue the antibiotics.  Give this 2 more days to see if it will resolve.  I realize its longer than what was required the last time.

## 2019-08-25 NOTE — Patient Instructions (Signed)
Breastfeeding and Human Lactation (4th ed., pp. 262-299). [place], [place]: [employer].">   Breastfeeding and Mastitis    Mastitis is inflammation of the breast tissue. It can occur in women who are breastfeeding. This can make breastfeeding painful. Mastitis will sometimes go away on its own, especially if it is not caused by an infection (non-infectious mastitis). Your health care provider will help determine if medical treatment is needed. Treatment may be needed if the condition is caused by a bacterial infection (infectious mastitis).  What are the causes?  This condition is often associated with a blocked milkduct, which can happen when too much milk builds up in the breast. Causes of excess milk in the breast can include:  Poor latch-on. If your baby is not latched onto the breast properly, he or she may not empty your breast completely while breastfeeding.  Allowing too much time to pass between feedings.  Wearing a bra or other clothing that is too tight. This puts extra pressure on the milk ducts so milk does not flow through them as it should.  Milk remaining in the breast because it is overfilled (engorged).  Stress and fatigue.  Mastitis can also be caused by a bacterial infection. Bacteria may enter the breast tissue through cuts, cracks, or openings in the skin near the nipple area. Cracks in the skin are often caused when your baby does not latch on properly to the breast.  What are the signs or symptoms?  Symptoms of this condition include:  Swelling, redness, tenderness, and pain in an area of the breast. This usually affects the upper part of the breast, toward the armpit region. In most cases, it affects only one breast. In some cases, it may occur on both breasts at the same time and affect a larger portion of breast tissue.  Swelling of the glands under the arm on the same side.  Fatigue, headache, and flu-like muscle aches.  Fever.  Rapid pulse.  Symptoms usually last 2 to 5  days. Breast pain and redness are at their worst on day 2 and day 3, and they usually go away by day 5. If an infection is left to progress, a collection of pus (abscess) may develop.  How is this diagnosed?  This condition can be diagnosed based on your symptoms and a physical exam. You may also have tests, such as:  Blood tests to determine if your body is fighting a bacterial infection.  Mammogram or ultrasound tests to rule out other problems or diseases.  Fluid tests. If an abscess has developed, the fluid in the abscess may be removed with a needle. The fluid may be analyzed to determine if bacteria are present.  Breast milk may be cultured and tested for bacteria.  How is this treated?  This condition will sometimes go away on its own. Your health care provider may choose to wait 24 hours after first seeing you to decide whether treatment is needed. If treatment is needed, it may include:  Strategies to manage breastfeeding. This includes continuing to breastfeed or pump in order to allow adequate milk flow, using breast massage, and applying heat or cold to the affected area.  Self-care such as rest and increased fluid intake.  Medicine for pain.  Antibiotic medicine to treat a bacterial infection. This is usually taken by mouth.  If an abscess has developed, it may be treated by removing fluid with a needle.  Follow these instructions at home:  Medicines  Take   were prescribed an antibiotic medicine, take it as told by your health care provider. Do not stop taking the antibiotic even if you start to feel better. General instructions  Do not wear a tight or underwire bra. Wear a soft, supportive bra.  Increase your fluid intake, especially if you have a fever.  Get plenty of rest. For breastfeeding:  Continue to empty your breasts as often as possible, either by  breastfeeding or using an electric breast pump. This will lower the pressure and the pain that comes with it. Ask your health care provider if changes need to be made to your breastfeeding or pumping routine.  Keep your nipples clean and dry.  During breastfeeding, empty the first breast completely before going to the other breast. If your baby is not emptying your breasts completely, use a breast pump to empty your breasts.  Use breast massage during feeding or pumping sessions.  If directed, apply moist heat to the affected area of your breast right before breastfeeding or pumping. Use the heat source that your health care provider recommends.  If directed, put ice on the affected area of your breast right after breastfeeding or pumping: ? Put ice in a plastic bag. ? Place a towel between your skin and the bag. ? Leave the ice on for 20 minutes.  If you go back to work, pump your breasts while at work to stay in time with your nursing schedule.  Do not allow your breasts to become engorged. Contact a health care provider if:  You have pus-like discharge from the breast.  You have a fever.  Your symptoms do not improve within 2 days of starting treatment.  Your symptoms return after you have recovered from a breast infection. Get help right away if:  Your pain and swelling are getting worse.  You have pain that is not controlled with medicine.  You have a red line extending from the breast toward your armpit. Summary  Mastitis is inflammation of the breast tissue. It is often caused by a blocked milk duct or bacteria.  This condition may be treated with hot and cold compresses, medicines, self-care, and certain breastfeeding strategies.  If you were prescribed an antibiotic medicine, take it as told by your health care provider. Do not stop taking the antibiotic even if you start to feel better.  Continue to empty your breasts as often as possible either by breastfeeding or  using an electric breast pump. This information is not intended to replace advice given to you by your health care provider. Make sure you discuss any questions you have with your health care provider. Document Released: 03/17/2005 Document Revised: 08/09/2018 Document Reviewed: 11/21/2016 Elsevier Patient Education  2020 [employer]. Fibrocystic Breast Changes  Fibrocystic breast changes are changes in breast tissue that can cause breasts to become swollen, lumpy, or painful. This can happen due to buildup of scar-like tissue (fibrous tissue) or the forming of fluid-filled lumps (cysts) in the breast. This is a common condition, and it is not cancerous (is benign). The exact cause is not known, but it seems to occur when women go through hormonal changes during their menstrual cycle. Fibrocystic breast changes can affect one or both breasts. What are the causes? The exact cause of fibrocystic breast changes is not known. However, this condition:  May be related to the female hormones estrogen and progesterone.  May be influenced by family traits that get passed from parent to child (genetics). What are the signs  or symptoms? Symptoms of this condition may affect one or both breasts, and may include:  Tenderness, mild discomfort, or pain.  Swelling.  Rope-like tissue that can be felt when touching the breast.  Lumps in one or both breasts.  Changes in breast size. Breasts may get larger before the menstrual period and smaller after the menstrual period.  Green or dark brown discharge from the nipple. Symptoms are usually worse before menstrual periods start, and they get better toward the end of menstrual periods. How is this diagnosed? This condition is diagnosed based on your medical history and a physical exam of your breasts. You may also have tests, such as:  A breast X-ray (mammogram).  Ultrasound of your breasts.  MRI.  Removal of a breast tissue sample for testing  (breast biopsy). This may be done if your health care provider thinks that something else may be causing changes in your breasts. How is this treated? Often, treatment is not needed for this condition. In some cases, treatment may include:  Taking over-the-counter pain relievers to help lessen pain or discomfort.  Limiting or avoiding caffeine. Foods and beverages that contain caffeine include chocolate, soda, coffee, and tea.  Reducing sugar and fat in your diet. Your health care provider may also recommend:  A procedure to remove fluid from a cyst that is causing pain (fine needle aspiration).  Surgery to remove a cyst that is large or tender or does not go away. Follow these instructions at home:  Examine your breasts after every menstrual period. If you do not have menstrual periods, check your breasts on the first day of every month. Feel for changes in your breasts, such as: ? More tenderness. ? A new growth. ? A change in size. ? A change in an existing lump.  Take over-the-counter and prescription medicines only as told by your health care provider.  Wear a well-fitted support or sports bra, especially when exercising.  Decrease or avoid caffeine, fat, and sugar in your diet as directed by your health care provider. Contact a health care provider if:  You have fluid leaking from your nipple, especially if it is bloody.  You have new lumps or bumps in your breast.  Your breast becomes enlarged, red, and painful.  You have areas of your breast that pucker inward.  Your nipple appears flat or indented. Get help right away if:  You have redness of your breast and the redness is spreading. Summary  Fibrocystic breast changes are changes in breast tissue that can cause breasts to become swollen, lumpy, or painful.  This condition may be related to the female hormones estrogen and progesterone.  With this condition, it is important to examine your breasts after every  menstrual period. If you do not have menstrual periods, check your breasts on the first day of every month. This information is not intended to replace advice given to you by your health care provider. Make sure you discuss any questions you have with your health care provider. Document Released: 09/06/2006 Document Revised: 11/02/2017 Document Reviewed: 07/19/2016 Elsevier Patient Education  2020 [employer].

## 2019-08-25 NOTE — ED Notes (Signed)
At bedside with dr Joseph Art for eva;uation of right breast, concerns for mastitis

## 2019-08-25 NOTE — MAU Provider Note (Signed)
S [name] is a 28 y.o. [phone] female 3 mos postpartum who presents to MAU today with complaint of mastitis. This has been a recurrent problem for her. She is currently taking abx. Reports fevers over the weekend. She called the office and was told to come here or UC.   O BP 135/85   Pulse 67   Temp 98 F (36.7 C)   Resp 16  Physical Exam  Constitutional: She is oriented to person, place, and time. She appears well-developed and well-nourished. No distress.  HENT:  Head: Normocephalic and atraumatic.  Respiratory: Effort normal. No respiratory distress.  Musculoskeletal: Normal range of motion.  Neurological: She is alert and oriented to person, place, and time.  Psychiatric: She has a normal mood and affect.   MDM: Chart reviewed, pt >6 weeks postpartum  A Non pregnant female Medical screening exam complete  No results found for this or any previous visit (from the past 24 hour(s)). P Discharge from MAU in stable condition Patient given the option of transfer to [facility] or UC for further evaluation  List of options for follow-up given  Warning signs for worsening condition that would warrant emergency follow-up discussed Patient may return to MAU as needed for pregnancy related complaints  Julianne Handler, CNM 08/25/2019 11:03 AM

## 2019-08-25 NOTE — Telephone Encounter (Signed)
Pt called and reports she is still having Mastitis Symptoms. She reports she has had a fever off and on all weekend, HA, and chills and hot flashes. She has had some nausea in the beginning of the process. She reports her fever is up to 100.5. She is taking Ibuprofen. She started ATB Friday morning and is taking 3 capsules TID.  Her Symptoms started with burning on Thursday evening with flu like symptoms on Friday at 4 am.   Pt reports she is seeing more veining. She is feeling a lump to top right of the breast and the underside of the entire breast. She has knots that she is not able to pump or massage out. Infant has not been BF for a while.   She was taking warm baths and expressing milk in the shower and that has helped some. She is pumping about 2-3 hours and she reports that the milk is not flowing well. She is now getting 2 ounces per pumping and was getting 5-6 ounces prior out of effected breast.   She reports the breast is very warm to touch. She reports redness to the breast is most of the underside of the breast and on the top of the breast where she is feeling the firmness and lumpiness.   Spoke with Dr. Ilda Basset and he recommended pt go to MAU or Urgent Care for evaluation for abscess. Advised pt to take her pump parts with her and pumping should not be interrupted. Pt to call fiance to come home to care for infant and then go to MAU or Urgent Care for evaluation.

## 2019-08-25 NOTE — Progress Notes (Signed)
History:  Whitney Gonzalez is a 28 y.o. [phone] who presents to clinic today for breast pain. Pt reports she was seen this past Friday 08/22/2019 and diagnosed with mastitis and put on clindamycin for 7days. Pt reports she feels like symptoms have improved since that time, but is still reporting fever up to 100.5 along with breast pain and is reporting changes in breast skin in upper, outer quadrant of right breast. Pt reports she last took Tylenol about 2-3hrs ago. Pt also reports a HA when fever is high, but HA resolves with Tylenol. Pt also reports she believes her milk supply is becoming less.  Pt is 3 months postpartum and reports she was previously treated for mastitis at about 1 month postpartum and was treated with clindamycin for 4 days with successful resolution of symptoms. Also at that time, pt reports she was sent for a breast US with normal results.  The following portions of the patient's history were reviewed and updated as appropriate: allergies, current medications, family history, past medical history, social history, past surgical history and problem list.  Review of Systems:  Review of Systems  Constitutional: Positive for fever. Negative for chills and weight loss.  Respiratory: Negative for shortness of breath.   Cardiovascular: Negative for chest pain.  Skin: Negative for itching and rash.  Neurological: Positive for headaches.     Objective:  Physical Exam BP 128/83   Pulse 76   Temp 98.8 F (37.1 C)   Wt 136 lb (61.7 kg)   BMI 26.56 kg/m  Physical Exam  Constitutional: She is oriented to person, place, and time. She appears well-developed and well-nourished. No distress.  HENT:  Head: Normocephalic and atraumatic.  Respiratory: Effort normal. She exhibits no edema, no deformity and no swelling. Right breast exhibits mass (see picture with highlighted areas). Right breast exhibits no inverted nipple, no nipple discharge, no skin change and no tenderness.  Left breast exhibits no inverted nipple, no mass, no nipple discharge, no skin change and no tenderness. Breasts are asymmetrical (per picture, also right nipple appears more purple compared with  left).    GI: Soft.  Neurological: She is alert and oriented to person, place, and time.  Skin: Skin is warm and dry. She is not diaphoretic.  Psychiatric: She has a normal mood and affect. Her behavior is normal. Judgment and thought content normal.   Labs and Imaging Results for orders placed or performed in visit on 08/25/19 (from the past 24 hour(s))  Pregnancy, urine POC     Status: None   Collection Time: 08/25/19  5:27 PM  Result Value Ref Range   Preg Test, Ur NEGATIVE NEGATIVE    No results found.   Assessment & Plan:   1. Breast pain - pt to continue ABX as prescribed - continue pumping q2-3hrs -pt to continue Tylenol/Advil PRN for fever/pain -discussed fibrocystic breast disease and symptoms associated with this and ways to alleviate pain - discussed s/sx of breast cancer, including inflammatory breast cancer - US BREAST COMPLETE UNI RIGHT INC AXILLA; Future  2. Fever, unspecified fever cause - pt offered COVID/flu swabs, but declines stating "I really don't think it's that" -pt advised to quarantine for 2 weeks from onset of symptoms, wear mask around baby and wash hands frequently  Whitney Gonzalez, Gerrie Nordmann, NP 08/25/2019 10:18 PM

## 2019-08-25 NOTE — ED Triage Notes (Signed)
Pt here with right sided mastitis; pt told to come by the [facility] as they think that she needs to imaging at the breast center; pt is on antibiotics x 4 days

## 2019-08-26 ENCOUNTER — Ambulatory Visit
Admission: RE | Admit: 2019-08-26 | Discharge: 2019-08-26 | Disposition: A | Discharge status: Home / Self Care | Payer: Medicaid Other | Source: Ambulatory Visit | Attending: Women's Health | Admitting: Women's Health

## 2019-08-26 ENCOUNTER — Other Ambulatory Visit: Payer: Self-pay | Admitting: Women's Health

## 2019-08-26 DIAGNOSIS — N644 Mastodynia: Secondary | ICD-10-CM

## 2019-08-27 ENCOUNTER — Telehealth: Payer: Self-pay | Admitting: Women's Health

## 2019-08-27 NOTE — Telephone Encounter (Signed)
Called patient to discuss [place] results. Pt identified by two identifiers. Pt reports she is feeling better and is continuing to take her ABX as prescribed. Pt questions asked and answered. Pt reports that she was given a second prescription for 7days of Clindamycin by urgent care. Pt was advised prior to starting ABX for a second course to schedule appt with women's health if she has additional concerns about breast issues. Pt questions asked and answered. Pt advised to contact [facility] regarding cessation of breastfeeding and previously discussed plan with cabbage leaves and sudafed. Pt agrees with plan.  Clarisa Fling, NP  10:17 AM 08/27/2019

## 2019-09-03 ENCOUNTER — Telehealth (INDEPENDENT_AMBULATORY_CARE_PROVIDER_SITE_OTHER): Payer: Medicaid Other | Admitting: General Practice

## 2019-09-03 DIAGNOSIS — Z3041 Encounter for surveillance of contraceptive pills: Secondary | ICD-10-CM

## 2019-09-03 MED ORDER — NORETHIN ACE-ETH ESTRAD-FE 1-20 MG-MCG(24) PO TABS: 1.0000 | ORAL_TABLET | Freq: Every day | ORAL | 11 refills | Status: DC

## 2019-09-03 NOTE — Telephone Encounter (Signed)
Patient called into front office stating she is still having problems with engorgement especially on her left side. Patient states she thinks her right side is almost completely dried up and it is a lot smaller than her left breast. Patient states she has been using ice, cabbage leaves, took a few doses of sudafed but still feels engorged, lumpy, and swollen on the left side. She states she knows she wasn't supposed to but she stopped cold [place] last week instead of weaning down. She states she was worried about the engorgement and some occasional burning in her left breast on Friday after we closed so she went to see her PCP who told her to go ahead and take the second dose of clindamycin which she did. Patient states she will express or compress a painful spot on her breast but only if she absolutely needs to. She states the last time she expressed was over 24 hours ago. Reports left breast is swollen, lumpy & engorged currently. She also reports minor burning in her right breast near her armpit.  Advised patient she may also try taking a couple doses of benadryl, continue to wear a tight supportive bra, try drinking many cups of peppermint/sage tea a day, recommended taking probiotic daily, and discussed importance of hand expression/pumping enough for relief. Discussed continued engorgement is a risk factor for mastitis and it is important she hand express enough for relief/comfort when she starts become full. Discussed she may have to do that a few times a day and that's okay, but her supply will eventually dry up. Asked about birth control & she states she takes the mini pill. Discussed we can switch over to regular OCP that contains estrogen and that could help dry up her supply as well. Patient verbalized understanding & will call back if needed. New Rx sent to pharmacy.

## 2019-09-09 ENCOUNTER — Telehealth: Payer: Self-pay | Admitting: Family Medicine

## 2019-09-09 NOTE — Telephone Encounter (Signed)
The right breast is giving stabbing, burning, pain. The left side is fuller than the right. The main concern is her breast is peeling. She stated it seems like its sunburned peeling.

## 2019-09-10 NOTE — Telephone Encounter (Signed)
Called pt and she reports she does not feel like she has Mastitis. Pt reports her nipples are peeling, both sides. They are no longer purple or reddened. They are not inflamed or irritaed as they were. Pt has stopped pumping and breast feeding at this time. She has not changed detergent recently.   Pt is not having letdowns any more that she is aware of. She is feeling burning and stabbing to her right breast at random times about 1-2 times a day. It is very painful when it does happen and feels like a knife stabbing pain. Pt is not expressing milk at this time. Pt feels right side is empty.   Pt was seen at the after hours clinic a few weeks ago and had another [place] and she still has the same lump in the right breast that is bothering her. Pt reports the [place] that was done did not include the area that she is feeling the lump in currently. Pt is most concerned with the peeling nipples. Discussed with her that I will speak with a provider and call her back with recommendations. Pt voiced understanding.

## 2019-09-11 ENCOUNTER — Ambulatory Visit (INDEPENDENT_AMBULATORY_CARE_PROVIDER_SITE_OTHER): Payer: Medicaid Other | Admitting: Obstetrics and Gynecology

## 2019-09-11 ENCOUNTER — Other Ambulatory Visit: Payer: Self-pay

## 2019-09-11 ENCOUNTER — Telehealth: Payer: Self-pay | Admitting: Lactation Services

## 2019-09-11 VITALS — BP 121/79 | HR 83 | Wt 137.1 lb

## 2019-09-11 DIAGNOSIS — N644 Mastodynia: Secondary | ICD-10-CM | POA: Diagnosis not present

## 2019-09-11 NOTE — Telephone Encounter (Signed)
Spoke with Dr. Ilda Basset about issues with breasts. He recommends that pt come in to the office for evaluation.   Attempted to call pt and she did not answer and voicemail was full so not able to leave a message.   Sent My Chart message to pt and also sent message to front desk staff to schedule appt.

## 2019-09-11 NOTE — Progress Notes (Signed)
Whitney Gonzalez is a 28 y.o. female status post vaginal delivery on 6/10. She has been successfully breast feeding up until August 25 where she stopped d/t recurring mastitis. She completed 2 rounds of antibiotics in the post partum period. She has been to the breast center 2 x which both [place] have been negative. She continues to have shooting pain in both breasts. She continues to have cracked nipples and areas in her breast that are lumpy. Nothing has made it better.   GENERAL: Well-developed, well-nourished female in no acute distress.  LUNGS: Effort normal SKIN: Warm, dry and without erythema PSYCH: Normal mood and affect Breast: Bilateral breast with dark purple appearing nipples. Both nipples have a cracked appearance. Left breast with golf ball size, mobile, and tender.   A:  1. Breast pain in female  - Ambulatory referral to General Surgery  - See lactations note.     Lezlie Lye, NP 09/15/2019 3:16 PM

## 2019-09-11 NOTE — Progress Notes (Signed)
Met with pt while she was in the office to evaluate breasts.   Nipples are dark pink and purple. She has invagination to her nipples . Pt with stabbing pain to outer right breast at times throughout the day. Pt reports her nipples are peeling, they were noted to be a little flaky although not like a typical yeast infection look, although yeast may be a possibility. Right breast is smaller that the left breast. Left breast with golf ball sized lump to upper breast near chest wall around the 12-1 o'clock area. Pt reports this is a newer finding. Pt with small areas in the left breast that feel more like peas.  Discussed with Noni Saupe and plan is to send pt to a breast specialist for evaluation.

## 2019-09-15 ENCOUNTER — Other Ambulatory Visit: Payer: Self-pay | Admitting: Advanced Practice Midwife

## 2019-09-17 ENCOUNTER — Telehealth: Payer: Self-pay

## 2019-09-17 ENCOUNTER — Other Ambulatory Visit: Payer: Self-pay

## 2019-09-17 MED ORDER — CITALOPRAM HYDROBROMIDE 10 MG PO TABS: 10.0000 mg | ORAL_TABLET | Freq: Every day | ORAL | 0 refills | Status: DC

## 2019-09-17 NOTE — Telephone Encounter (Signed)
Pt called the front office requesting to have a one time refill on her Celexa 10 mg tablet until she is seen by a psychiatrist next month.  Per Kerry Hough, PA pt can have refill on Celexa with no refills.  Future refills will need to be provided by psychiatrist.   Mel Almond, RN 09/17/19

## 2019-09-17 NOTE — Telephone Encounter (Signed)
p t called Nurse line to request that we send forms to Psych. Offices, called pt to inform that she needs to fill out a ROI from those offices in order for [place] to release her info. Pt verbalized understanding.

## 2019-10-13 ENCOUNTER — Encounter (HOSPITAL_COMMUNITY): Payer: Self-pay | Admitting: Emergency Medicine

## 2019-10-13 ENCOUNTER — Ambulatory Visit (HOSPITAL_COMMUNITY)
Admission: EM | Admit: 2019-10-13 | Discharge: 2019-10-13 | Disposition: A | Discharge status: Home / Self Care | Payer: Medicaid Other | Attending: Family Medicine | Admitting: Family Medicine

## 2019-10-13 ENCOUNTER — Other Ambulatory Visit: Payer: Self-pay

## 2019-10-13 DIAGNOSIS — N390 Urinary tract infection, site not specified: Secondary | ICD-10-CM

## 2019-10-13 DIAGNOSIS — Z3202 Encounter for pregnancy test, result negative: Secondary | ICD-10-CM | POA: Diagnosis not present

## 2019-10-13 LAB — POCT URINALYSIS DIP (DEVICE)
Bilirubin Urine: NEGATIVE
Glucose, UA: NEGATIVE mg/dL
Ketones, ur: NEGATIVE mg/dL
Leukocytes,Ua: NEGATIVE
Nitrite: NEGATIVE
Protein, ur: NEGATIVE mg/dL
Specific Gravity, Urine: >=1.03 (ref 1.005–1.030)
Urobilinogen, UA: 0.2 mg/dL (ref 0.0–1.0)
pH: 6 (ref 5.0–8.0)

## 2019-10-13 LAB — POCT PREGNANCY, URINE: Preg Test, Ur: NEGATIVE

## 2019-10-13 MED ORDER — NITROFURANTOIN MONOHYD MACRO 100 MG PO CAPS: 100.0000 mg | ORAL_CAPSULE | Freq: Two times a day (BID) | ORAL | 0 refills | Status: DC

## 2019-10-13 MED ORDER — NAPROXEN 500 MG PO TABS: 500.0000 mg | ORAL_TABLET | Freq: Two times a day (BID) | ORAL | 0 refills | Status: DC

## 2019-10-13 MED ORDER — CYCLOBENZAPRINE HCL 5 MG PO TABS: 5.0000 mg | ORAL_TABLET | Freq: Three times a day (TID) | ORAL | 0 refills | Status: DC | PRN

## 2019-10-13 MED ORDER — FLUCONAZOLE 150 MG PO TABS: 150.0000 mg | ORAL_TABLET | Freq: Every day | ORAL | 0 refills | Status: DC

## 2019-10-13 NOTE — Discharge Instructions (Addendum)
Needs drink more fluids Take nitrofurantoin antibiotic 2 times a day for 5 days Use diflucan if you need it Take Naprosyn 2 times a day with food.  This is for your back pain and cramping Take Flexeril as needed as muscle relaxer.  This is usually at bedtime Get plenty of rest Follow-up with your primary care doctor

## 2019-10-13 NOTE — ED Triage Notes (Signed)
Frequent urination for a month.  Denies burning.  Back pain started 4-5 days ago, lower abdominal pain.  Pain is more constant, no fever, has had chills and nausea.
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
## 2019-10-14 ENCOUNTER — Telehealth: Payer: Self-pay | Admitting: Lactation Services

## 2019-10-14 NOTE — Telephone Encounter (Signed)
[facility] in regards to referral placed 10/08 for breast pain and skin changes to the left breast. Spoke with representative who said pt was not in their system. They transferred me to Judson Roch, new pt coordinator.   Judson Roch reports pt is not in the system. Pt reports she does not get the Epic referrals. Judson Roch is to call the patient and set up appointment.   Will follow up message with pt via MyChart.

## 2019-10-22 ENCOUNTER — Ambulatory Visit (INDEPENDENT_AMBULATORY_CARE_PROVIDER_SITE_OTHER): Payer: Medicaid Other | Admitting: Psychiatry

## 2019-10-22 ENCOUNTER — Encounter (HOSPITAL_COMMUNITY): Payer: Self-pay | Admitting: Psychiatry

## 2019-10-22 ENCOUNTER — Other Ambulatory Visit: Payer: Self-pay

## 2019-10-22 DIAGNOSIS — F411 Generalized anxiety disorder: Secondary | ICD-10-CM

## 2019-10-22 DIAGNOSIS — F4310 Post-traumatic stress disorder, unspecified: Secondary | ICD-10-CM

## 2019-10-22 DIAGNOSIS — F319 Bipolar disorder, unspecified: Secondary | ICD-10-CM | POA: Diagnosis not present

## 2019-10-22 MED ORDER — CITALOPRAM HYDROBROMIDE 20 MG PO TABS: 20.0000 mg | ORAL_TABLET | Freq: Every day | ORAL | 1 refills | Status: DC
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
## 2019-10-23 ENCOUNTER — Emergency Department (HOSPITAL_COMMUNITY): Payer: Medicaid Other

## 2019-10-23 ENCOUNTER — Other Ambulatory Visit: Payer: Self-pay

## 2019-10-23 ENCOUNTER — Emergency Department (HOSPITAL_COMMUNITY)
Admission: EM | Admit: 2019-10-23 | Discharge: 2019-10-24 | Disposition: A | Discharge status: Home / Self Care | Payer: Medicaid Other | Attending: Emergency Medicine | Admitting: Emergency Medicine

## 2019-10-23 DIAGNOSIS — E039 Hypothyroidism, unspecified: Secondary | ICD-10-CM | POA: Diagnosis not present

## 2019-10-23 DIAGNOSIS — Z79899 Other long term (current) drug therapy: Secondary | ICD-10-CM | POA: Insufficient documentation

## 2019-10-23 DIAGNOSIS — N189 Chronic kidney disease, unspecified: Secondary | ICD-10-CM | POA: Diagnosis not present

## 2019-10-23 DIAGNOSIS — R110 Nausea: Secondary | ICD-10-CM | POA: Insufficient documentation

## 2019-10-23 DIAGNOSIS — R29810 Facial weakness: Secondary | ICD-10-CM | POA: Diagnosis not present

## 2019-10-23 DIAGNOSIS — R519 Headache, unspecified: Secondary | ICD-10-CM | POA: Insufficient documentation

## 2019-10-23 DIAGNOSIS — R200 Anesthesia of skin: Secondary | ICD-10-CM | POA: Diagnosis not present

## 2019-10-23 NOTE — ED Triage Notes (Signed)
Pt here for headache/L temple pain since this morning when she woke up. Pt nauseas while eating lunch. Took a Maxalt to help with no relief. Hx of migraines. Pt noticed facial droop and L eye numbness and then burning on L jaw, all resolved. Pt sts she feels confused. Sent here by neurologist for possible head scan.

## 2019-10-23 NOTE — ED Notes (Signed)
Patient requested a BP check because she feels like she is going to pass out; states her dr sent her here for a CT scan. Also states that her arm feels numb.

## 2019-10-24 LAB — CBC WITH DIFFERENTIAL/PLATELET
Abs Immature Granulocytes: 0.01 10*3/uL (ref 0.00–0.07)
Basophils Absolute: 0 10*3/uL (ref 0.0–0.1)
Basophils Relative: 0 %
Eosinophils Absolute: 0.1 10*3/uL (ref 0.0–0.5)
Eosinophils Relative: 2 %
HCT: 41.3 % (ref 36.0–46.0)
Hemoglobin: 14.3 g/dL (ref 12.0–15.0)
Immature Granulocytes: 0 %
Lymphocytes Relative: 46 %
Lymphs Abs: 3.1 10*3/uL (ref 0.7–4.0)
MCH: 31.4 pg (ref 26.0–34.0)
MCHC: 34.6 g/dL (ref 30.0–36.0)
MCV: 90.8 fL (ref 80.0–100.0)
Monocytes Absolute: 0.6 10*3/uL (ref 0.1–1.0)
Monocytes Relative: 9 %
Neutro Abs: 2.9 10*3/uL (ref 1.7–7.7)
Neutrophils Relative %: 43 %
Platelets: 287 10*3/uL (ref 150–400)
RBC: 4.55 MIL/uL (ref 3.87–5.11)
RDW: 11.5 % (ref 11.5–15.5)
WBC: 6.7 10*3/uL (ref 4.0–10.5)
nRBC: 0 % (ref 0.0–0.2)

## 2019-10-24 LAB — BASIC METABOLIC PANEL
Anion gap: 12 (ref 5–15)
BUN: 8 mg/dL (ref 6–20)
CO2: 22 mmol/L (ref 22–32)
Calcium: 9.5 mg/dL (ref 8.9–10.3)
Chloride: 106 mmol/L (ref 98–111)
Creatinine, Ser: 0.62 mg/dL (ref 0.44–1.00)
GFR calc Af Amer: >60 mL/min (ref >60)
GFR calc non Af Amer: >60 mL/min (ref >60)
Glucose, Bld: 91 mg/dL (ref 70–99)
Potassium: 4 mmol/L (ref 3.5–5.1)
Sodium: 140 mmol/L (ref 135–145)

## 2019-10-24 LAB — MAGNESIUM: Magnesium: 2.1 mg/dL (ref 1.7–2.4)

## 2019-10-24 MED ORDER — DIPHENHYDRAMINE HCL 25 MG PO CAPS: 25.0000 mg | ORAL_CAPSULE | Freq: Once | ORAL | Status: DC

## 2019-10-24 MED ORDER — DIPHENHYDRAMINE HCL 50 MG/ML IJ SOLN
25.0000 mg | Freq: Once | INTRAMUSCULAR | Status: AC
  Administered 2019-10-24: 02:00:00 25 mg via INTRAVENOUS
  Filled 2019-10-24: qty 1

## 2019-10-24 MED ORDER — METOCLOPRAMIDE HCL 5 MG/ML IJ SOLN
10.0000 mg | Freq: Once | INTRAMUSCULAR | Status: AC
  Administered 2019-10-24: 02:00:00 10 mg via INTRAVENOUS
  Filled 2019-10-24: qty 2

## 2019-10-24 MED ORDER — SODIUM CHLORIDE 0.9 % IV BOLUS
1000.0000 mL | Freq: Once | INTRAVENOUS | Status: AC
  Administered 2019-10-24: 02:00:00 1000 mL via INTRAVENOUS

## 2019-10-24 MED ORDER — METOCLOPRAMIDE HCL 5 MG/ML IJ SOLN: 10.0000 mg | Freq: Once | INTRAMUSCULAR | Status: DC

## 2019-10-24 NOTE — ED Notes (Signed)
Patient verbalizes understanding of discharge instructions. Opportunity for questioning and answers were provided. Armband removed by staff, pt discharged from ED ambulatory to home.  
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
## 2019-10-27 DIAGNOSIS — M654 Radial styloid tenosynovitis [de Quervain]: Secondary | ICD-10-CM | POA: Insufficient documentation

## 2019-10-29 ENCOUNTER — Telehealth: Payer: Self-pay

## 2019-10-29 ENCOUNTER — Ambulatory Visit: Payer: Medicaid Other | Admitting: Diagnostic Neuroimaging

## 2019-10-29 ENCOUNTER — Encounter: Payer: Self-pay | Admitting: Diagnostic Neuroimaging

## 2019-10-29 ENCOUNTER — Other Ambulatory Visit: Payer: Self-pay

## 2019-10-29 VITALS — BP 119/82 | HR 86 | Temp 97.8°F | Ht 60.0 in | Wt 142.8 lb

## 2019-10-29 DIAGNOSIS — R200 Anesthesia of skin: Secondary | ICD-10-CM

## 2019-10-29 DIAGNOSIS — G43109 Migraine with aura, not intractable, without status migrainosus: Secondary | ICD-10-CM

## 2019-10-29 MED ORDER — NORETHINDRONE 0.35 MG PO TABS: 1.0000 | ORAL_TABLET | Freq: Every day | ORAL | 0 refills | Status: DC
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
## 2019-10-29 NOTE — Patient Instructions (Signed)
-   consider low estrogen OCP  - continue increasing celexa per psychiatry

## 2019-10-29 NOTE — Telephone Encounter (Addendum)
Pt called and stated that she just left her from Neurology and they requested that she be taken off an estrogen BCP due to her headaches.  Pt is requesting to have another type of pill.   Called pt and pt informed me the above and wanted to know if she could something else different.  Per Dr. Ilda Basset pt have an Rx for Micronor until she can speak with a provider about a different BC.  Notified pt provider;s recommendation.  Pt verbalized understanding.

## 2019-11-20 ENCOUNTER — Other Ambulatory Visit: Payer: Self-pay

## 2019-11-20 ENCOUNTER — Ambulatory Visit (INDEPENDENT_AMBULATORY_CARE_PROVIDER_SITE_OTHER): Payer: Medicaid Other | Admitting: Licensed Clinical Social Worker

## 2019-11-20 DIAGNOSIS — F319 Bipolar disorder, unspecified: Secondary | ICD-10-CM | POA: Diagnosis not present

## 2019-11-20 DIAGNOSIS — F411 Generalized anxiety disorder: Secondary | ICD-10-CM | POA: Diagnosis not present

## 2019-11-20 DIAGNOSIS — F4310 Post-traumatic stress disorder, unspecified: Secondary | ICD-10-CM | POA: Diagnosis not present
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
## 2019-11-24 ENCOUNTER — Ambulatory Visit (HOSPITAL_COMMUNITY): Payer: Medicaid Other | Admitting: Psychiatry

## 2019-12-01 ENCOUNTER — Encounter: Payer: Self-pay | Admitting: *Deleted

## 2019-12-01 ENCOUNTER — Encounter (HOSPITAL_COMMUNITY): Payer: Self-pay | Admitting: Psychiatry

## 2019-12-01 ENCOUNTER — Telehealth: Payer: Self-pay | Admitting: Family Medicine

## 2019-12-01 ENCOUNTER — Ambulatory Visit (INDEPENDENT_AMBULATORY_CARE_PROVIDER_SITE_OTHER): Payer: Medicaid Other | Admitting: Psychiatry

## 2019-12-01 ENCOUNTER — Other Ambulatory Visit: Payer: Self-pay

## 2019-12-01 DIAGNOSIS — F411 Generalized anxiety disorder: Secondary | ICD-10-CM

## 2019-12-01 DIAGNOSIS — F4310 Post-traumatic stress disorder, unspecified: Secondary | ICD-10-CM

## 2019-12-01 MED ORDER — CITALOPRAM HYDROBROMIDE 20 MG PO TABS: 20.0000 mg | ORAL_TABLET | Freq: Every day | ORAL | 1 refills | Status: DC
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
## 2019-12-01 NOTE — Telephone Encounter (Signed)
Received a call from patient about discharge that had different coloring. Some days it's pink, some days it's gray. She is requesting to speak to a nurse because her PCP stated she should be seen ASAP.

## 2019-12-01 NOTE — Telephone Encounter (Signed)
Called pt to discuss her concerns and heard message stating that the mailbox is full and cannot accept any messages. MyChart message sent to pt stating that she can request a nurse visit, be seen by her PCP or go to an Urgent Care facility for evaluation.

## 2019-12-03 ENCOUNTER — Emergency Department (HOSPITAL_COMMUNITY)
Admission: EM | Admit: 2019-12-03 | Discharge: 2019-12-03 | Disposition: A | Discharge status: Home / Self Care | Payer: Medicaid Other | Attending: Emergency Medicine | Admitting: Emergency Medicine

## 2019-12-03 ENCOUNTER — Other Ambulatory Visit: Payer: Self-pay

## 2019-12-03 ENCOUNTER — Emergency Department (HOSPITAL_COMMUNITY): Payer: Medicaid Other

## 2019-12-03 ENCOUNTER — Encounter (HOSPITAL_COMMUNITY): Payer: Self-pay | Admitting: Emergency Medicine

## 2019-12-03 DIAGNOSIS — E039 Hypothyroidism, unspecified: Secondary | ICD-10-CM | POA: Insufficient documentation

## 2019-12-03 DIAGNOSIS — Z91040 Latex allergy status: Secondary | ICD-10-CM | POA: Insufficient documentation

## 2019-12-03 DIAGNOSIS — R300 Dysuria: Secondary | ICD-10-CM | POA: Insufficient documentation

## 2019-12-03 DIAGNOSIS — N898 Other specified noninflammatory disorders of vagina: Secondary | ICD-10-CM

## 2019-12-03 DIAGNOSIS — R102 Pelvic and perineal pain: Secondary | ICD-10-CM

## 2019-12-03 DIAGNOSIS — N289 Disorder of kidney and ureter, unspecified: Secondary | ICD-10-CM | POA: Insufficient documentation

## 2019-12-03 DIAGNOSIS — R1032 Left lower quadrant pain: Secondary | ICD-10-CM | POA: Diagnosis present

## 2019-12-03 DIAGNOSIS — Z79899 Other long term (current) drug therapy: Secondary | ICD-10-CM | POA: Insufficient documentation

## 2019-12-03 LAB — COMPREHENSIVE METABOLIC PANEL
ALT: 12 U/L (ref 0–44)
AST: 26 U/L (ref 15–41)
Albumin: 3.8 g/dL (ref 3.5–5.0)
Alkaline Phosphatase: 63 U/L (ref 38–126)
Anion gap: 7 (ref 5–15)
BUN: 11 mg/dL (ref 6–20)
CO2: 24 mmol/L (ref 22–32)
Calcium: 9.2 mg/dL (ref 8.9–10.3)
Chloride: 109 mmol/L (ref 98–111)
Creatinine, Ser: 0.61 mg/dL (ref 0.44–1.00)
GFR calc Af Amer: >60 mL/min (ref >60)
GFR calc non Af Amer: >60 mL/min (ref >60)
Glucose, Bld: 92 mg/dL (ref 70–99)
Potassium: 4.7 mmol/L (ref 3.5–5.1)
Sodium: 140 mmol/L (ref 135–145)
Total Bilirubin: 1 mg/dL (ref 0.3–1.2)
Total Protein: 6.7 g/dL (ref 6.5–8.1)

## 2019-12-03 LAB — CBC
HCT: 40.2 % (ref 36.0–46.0)
Hemoglobin: 13.8 g/dL (ref 12.0–15.0)
MCH: 31.9 pg (ref 26.0–34.0)
MCHC: 34.3 g/dL (ref 30.0–36.0)
MCV: 92.8 fL (ref 80.0–100.0)
Platelets: 290 10*3/uL (ref 150–400)
RBC: 4.33 MIL/uL (ref 3.87–5.11)
RDW: 11.9 % (ref 11.5–15.5)
WBC: 5.2 10*3/uL (ref 4.0–10.5)
nRBC: 0 % (ref 0.0–0.2)

## 2019-12-03 LAB — URINALYSIS, ROUTINE W REFLEX MICROSCOPIC
Bilirubin Urine: NEGATIVE
Glucose, UA: NEGATIVE mg/dL
Hgb urine dipstick: NEGATIVE
Ketones, ur: NEGATIVE mg/dL
Leukocytes,Ua: NEGATIVE
Nitrite: NEGATIVE
Protein, ur: NEGATIVE mg/dL
Specific Gravity, Urine: 1.018 (ref 1.005–1.030)
pH: 5 (ref 5.0–8.0)

## 2019-12-03 LAB — I-STAT BETA HCG BLOOD, ED (MC, WL, AP ONLY): I-stat hCG, quantitative: <5 m[IU]/mL (ref <5)

## 2019-12-03 LAB — LIPASE, BLOOD: Lipase: 32 U/L (ref 11–51)

## 2019-12-03 LAB — WET PREP, GENITAL
Clue Cells Wet Prep HPF POC: NONE SEEN
Sperm: NONE SEEN
Trich, Wet Prep: NONE SEEN
Yeast Wet Prep HPF POC: NONE SEEN

## 2019-12-03 MED ORDER — PROMETHAZINE HCL 25 MG PO TABS: 25.0000 mg | ORAL_TABLET | Freq: Four times a day (QID) | ORAL | 0 refills | Status: DC | PRN

## 2019-12-03 MED ORDER — DOXYCYCLINE HYCLATE 100 MG PO CAPS: 100.0000 mg | ORAL_CAPSULE | Freq: Two times a day (BID) | ORAL | 0 refills | Status: DC

## 2019-12-03 MED ORDER — MOXIFLOXACIN HCL 400 MG PO TABS: 400.0000 mg | ORAL_TABLET | Freq: Every day | ORAL | 0 refills | Status: AC

## 2019-12-03 MED ORDER — MOXIFLOXACIN HCL 400 MG PO TABS: 400.0000 mg | ORAL_TABLET | Freq: Two times a day (BID) | ORAL | 0 refills | Status: DC

## 2019-12-03 MED ORDER — IBUPROFEN 400 MG PO TABS
400.0000 mg | ORAL_TABLET | Freq: Once | ORAL | Status: AC | PRN
  Administered 2019-12-03: 400 mg via ORAL
  Filled 2019-12-03: qty 1

## 2019-12-03 MED ORDER — ONDANSETRON 4 MG PO TBDP
4.0000 mg | ORAL_TABLET | Freq: Once | ORAL | Status: AC
  Administered 2019-12-03: 17:00:00 4 mg via ORAL
  Filled 2019-12-03: qty 1

## 2019-12-03 MED ORDER — DOXYCYCLINE HYCLATE 100 MG PO TABS
100.0000 mg | ORAL_TABLET | Freq: Once | ORAL | Status: AC
  Administered 2019-12-03: 100 mg via ORAL
  Filled 2019-12-03: qty 1

## 2019-12-03 MED ORDER — CEFTRIAXONE SODIUM 1 G IJ SOLR: 500.0000 mg | Freq: Once | INTRAMUSCULAR | Status: DC

## 2019-12-03 NOTE — ED Triage Notes (Signed)
Pt reports urinary frequency with pink and green discharge. States trouble getting all of urine out. Endorses severe right sided abd and flank pain. Reports taking meds for interstitial cystitis this AM.

## 2019-12-03 NOTE — ED Notes (Signed)
Patient transported to Ultrasound 

## 2019-12-03 NOTE — Discharge Instructions (Addendum)
You were seen in the ER for worsening urinary symptoms, abnormal vaginal discharge and worsening pelvic pain  Your exam is most consistent with inflammation of your pelvis which is called pelvic inflammatory disease  Ultrasound showed a small follicle or cyst in the left side but it was otherwise normal  Work-up otherwise was normal  I suspect symptoms are related to PID.  This is treated with antibiotics.  You have several listed drug intolerances and treatment of PID is difficult, we are unable to use first-line antibiotics.  Pharmacy was consulted and they recommended specific antibiotics to treat this infection.  Alternate ibuprofen and Tylenol as needed for pain.  Avoid any sexual intercourse until your symptoms improve.  Follow-up on your STD testing on MyChart.  Follow-up with your OB/GYN in the next 7 days for reevaluation to make sure your symptoms are improving  Return to the ER for worsening pain, heavier vaginal discharge, fever greater than 100.4 or inability to urinate.
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
## 2019-12-03 NOTE — ED Notes (Signed)
Patient Alert and oriented to baseline. Stable and ambulatory to baseline. Patient verbalized understanding of the discharge instructions.  Patient belongings were taken by the patient.   

## 2019-12-04 ENCOUNTER — Ambulatory Visit: Payer: Self-pay

## 2019-12-04 LAB — GC/CHLAMYDIA PROBE AMP (~~LOC~~) NOT AT ARMC
Chlamydia: NEGATIVE
Neisseria Gonorrhea: NEGATIVE

## 2019-12-15 ENCOUNTER — Ambulatory Visit (HOSPITAL_COMMUNITY): Payer: Medicaid Other | Admitting: Licensed Clinical Social Worker

## 2019-12-15 ENCOUNTER — Other Ambulatory Visit: Payer: Self-pay

## 2019-12-15 ENCOUNTER — Telehealth (INDEPENDENT_AMBULATORY_CARE_PROVIDER_SITE_OTHER): Payer: Medicaid Other | Admitting: Licensed Clinical Social Worker

## 2019-12-15 NOTE — Telephone Encounter (Signed)
Marguita had a virtual therapy appointment scheduled today at 10am.  She did not present for virtual Webex appointment as scheduled, so clinician attempted to contact her by phone at 10:10am, but she did not answer, and a voicemail could not be left at this time due to messages being full.  Clinician also sent an email to her reminding her of this appointment and encouraging her to reschedule.  Clinician informed front desk staff of no-show event.    [address], [place], LCASA 12/15/19

## 2019-12-18 ENCOUNTER — Ambulatory Visit: Payer: Medicaid Other | Admitting: Obstetrics and Gynecology

## 2019-12-19 ENCOUNTER — Ambulatory Visit (INDEPENDENT_AMBULATORY_CARE_PROVIDER_SITE_OTHER): Payer: Medicaid Other | Admitting: Family Medicine

## 2019-12-19 ENCOUNTER — Other Ambulatory Visit: Payer: Self-pay

## 2019-12-19 ENCOUNTER — Encounter: Payer: Self-pay | Admitting: Family Medicine

## 2019-12-19 ENCOUNTER — Other Ambulatory Visit (HOSPITAL_COMMUNITY)
Admission: RE | Admit: 2019-12-19 | Discharge: 2019-12-19 | Disposition: A | Discharge status: Home / Self Care | Payer: Medicaid Other | Source: Ambulatory Visit | Attending: Family Medicine | Admitting: Family Medicine

## 2019-12-19 VITALS — BP 133/88 | HR 97 | Wt 145.7 lb

## 2019-12-19 DIAGNOSIS — N898 Other specified noninflammatory disorders of vagina: Secondary | ICD-10-CM | POA: Diagnosis not present

## 2019-12-19 DIAGNOSIS — R102 Pelvic and perineal pain: Secondary | ICD-10-CM | POA: Diagnosis present

## 2019-12-19 NOTE — Progress Notes (Signed)
[facility] appt scheduled on 12/24/19 @ 1315 for f/u interstitial cystitis.  Pt notified.

## 2019-12-19 NOTE — Progress Notes (Signed)
   GYNECOLOGY PROBLEM  VISIT ENCOUNTER NOTE  Subjective:   Whitney Gonzalez is a 29 y.o. [phone] female here for a a problem GYN visit.  Current complaints: pink discharge, mild itching. Mild discomfort but not truly pain. Took doxy and moxifloxacin which improved sx. Denies any sexual activity since treatment.   Denies abnormal vaginal bleeding, discharge, pelvic pain, problems with intercourse or other gynecologic concerns.    Gynecologic History No LMP recorded.   Health Maintenance Due  Topic Date Due  . TETANUS/TDAP  04/28/2019  . INFLUENZA VACCINE  07/05/2019     The following portions of the patient's history were reviewed and updated as appropriate: allergies, current medications, past family history, past medical history, past social history, past surgical history and problem list.  Review of Systems Pertinent items are noted in HPI.   Objective:  BP 133/88   Pulse 97   Wt 145 lb 11.2 oz (66.1 kg)   BMI 28.46 kg/m  Gen: well appearing, NAD HEENT: no scleral icterus CV: RR Lung: Normal WOB Ext: warm well perfused  PELVIC: Normal appearing external genitalia; normal appearing vaginal mucosa and cervix.  No abnormal discharge noted.   Normal uterine size, no other palpable masses, no uterine or adnexal tenderness.   Assessment and Plan:   1. Vaginal discharge PID resolving well after doxy and moxi treatment - Cervicovaginal ancillary only( [place])- just to rule out yeast given recent abx therapy - [place] GYN Pelvis Complete; Future  2. Pelvic pain Much improved after treatment. Still having some urinary sx and encouraged her to follow up with urology Will get [place] to eval cyst resolution - Cervicovaginal ancillary only( [facility]) - [place] GYN Pelvis Complete; Future   Please refer to After Visit Summary for other counseling recommendations.   Return in about 3 months (around 03/18/2020), or if symptoms worsen or fail to improve.  Caren Macadam,  MD, MPH, ABFM Attending [place] for [facility]

## 2019-12-22 LAB — CERVICOVAGINAL ANCILLARY ONLY
Bacterial Vaginitis (gardnerella): NEGATIVE
Candida Glabrata: NEGATIVE
Candida Vaginitis: NEGATIVE
Comment: NEGATIVE
Comment: NEGATIVE
Comment: NEGATIVE
Comment: NEGATIVE
Trichomonas: NEGATIVE

## 2019-12-23 ENCOUNTER — Other Ambulatory Visit: Payer: Self-pay

## 2019-12-23 ENCOUNTER — Ambulatory Visit (INDEPENDENT_AMBULATORY_CARE_PROVIDER_SITE_OTHER): Payer: Medicaid Other | Admitting: Licensed Clinical Social Worker

## 2019-12-23 DIAGNOSIS — F4310 Post-traumatic stress disorder, unspecified: Secondary | ICD-10-CM

## 2019-12-23 DIAGNOSIS — F411 Generalized anxiety disorder: Secondary | ICD-10-CM

## 2019-12-23 DIAGNOSIS — F319 Bipolar disorder, unspecified: Secondary | ICD-10-CM | POA: Diagnosis not present
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
## 2019-12-27 IMAGING — US US FETAL BPP W/NONSTRESS
1 series · 13 of 15 positions shown · non-contrast
Comparison: none

[Series 1: us fetal bpp w/nonstress · 15 acquisitions, 13 frames shown]
[im 1/15]
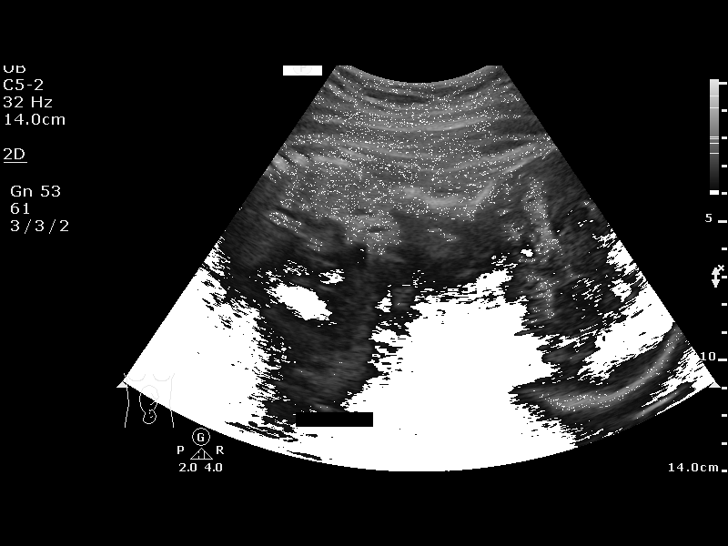
[im 2/15]
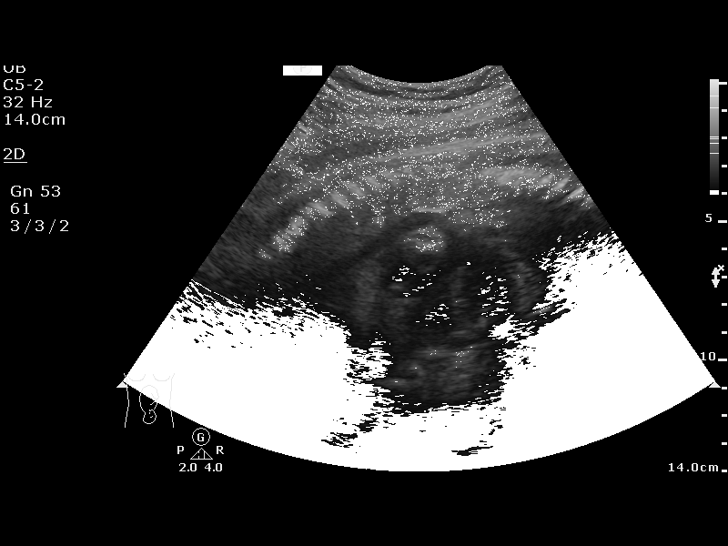
[im 3/15]
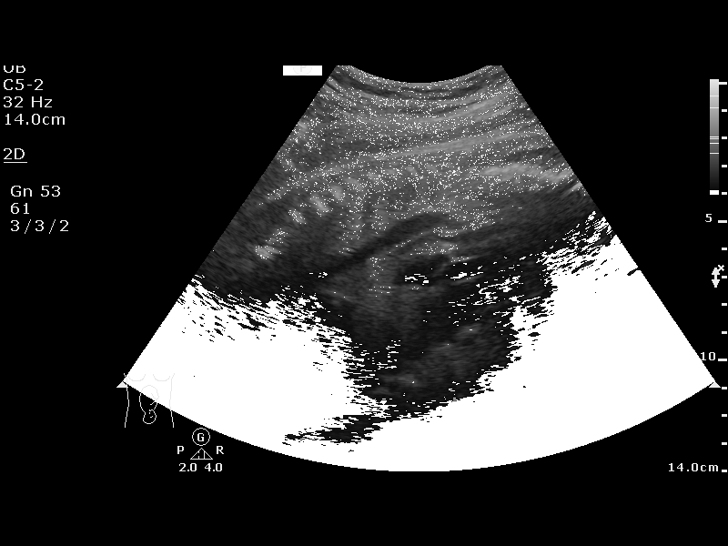
[im 5/15]
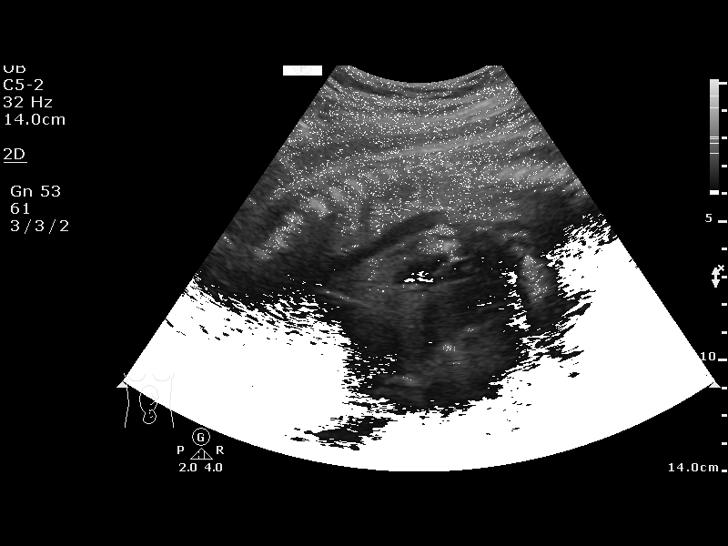
[im 6/15]
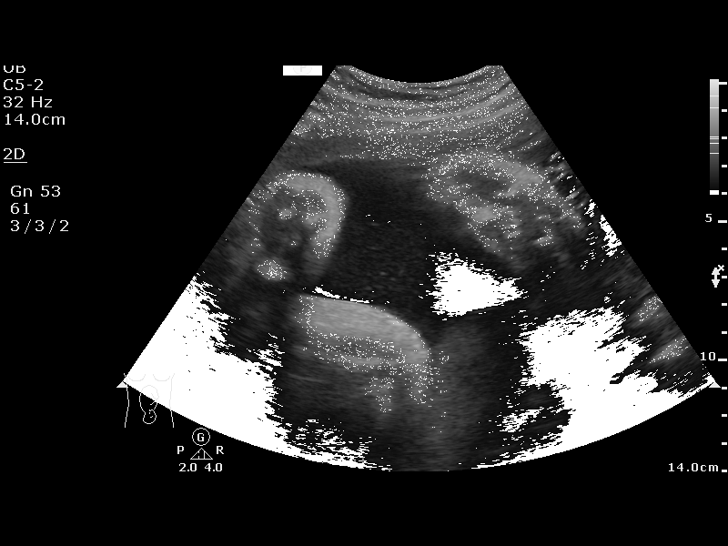
[im 7/15]
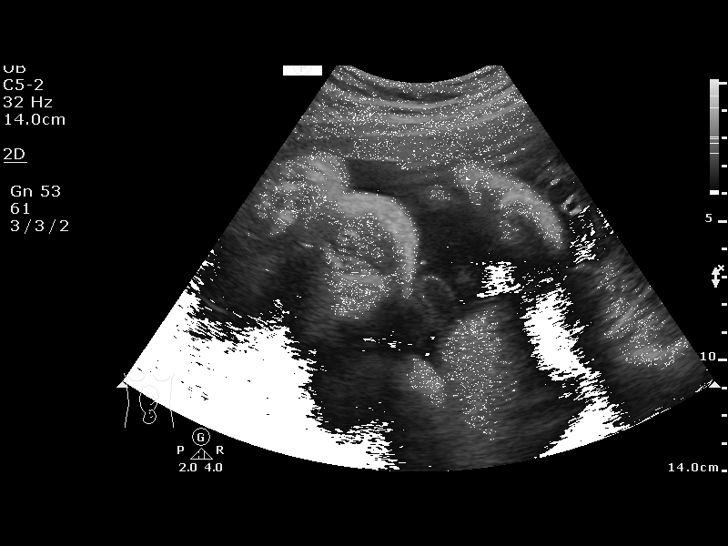
[im 8/15]
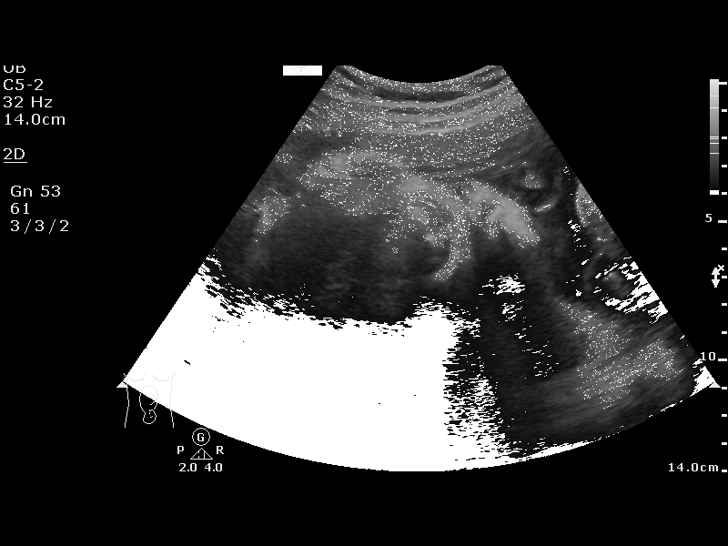
[im 9/15]
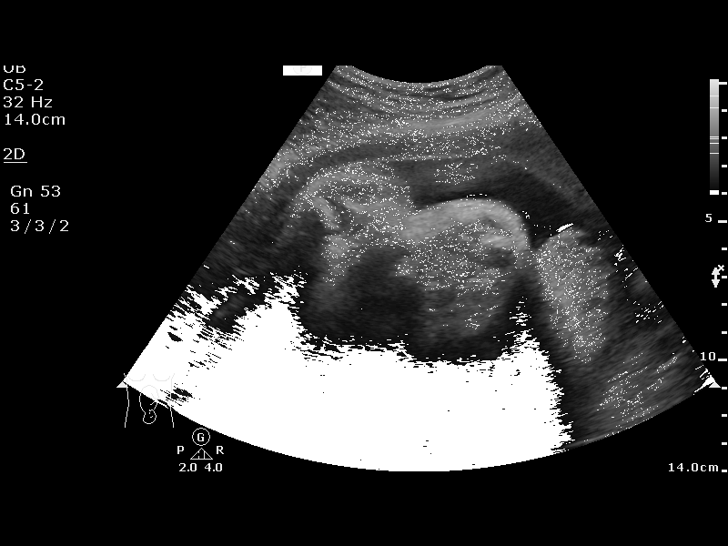
[im 10/15]
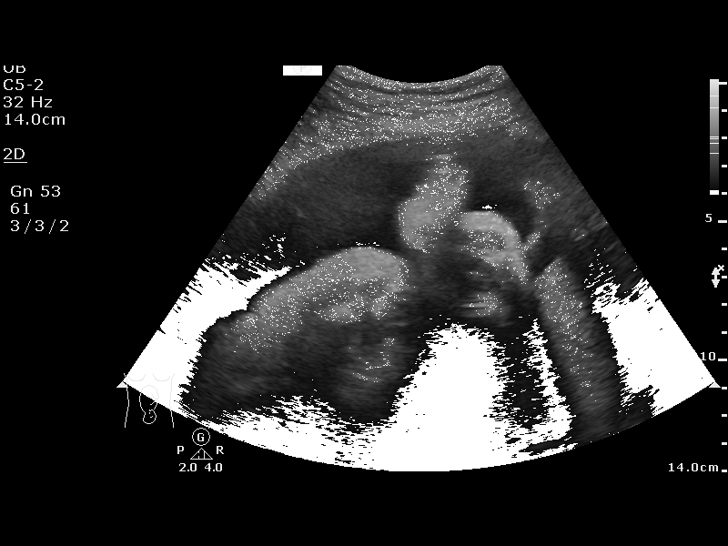
[im 11/15]
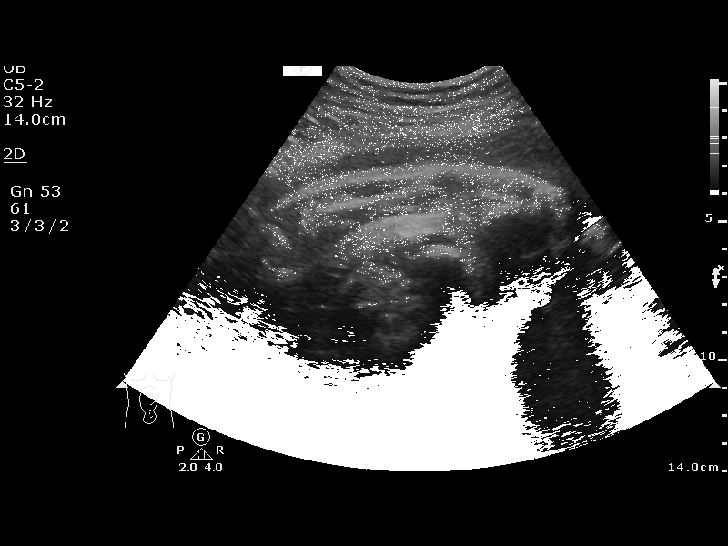
[im 13/15]
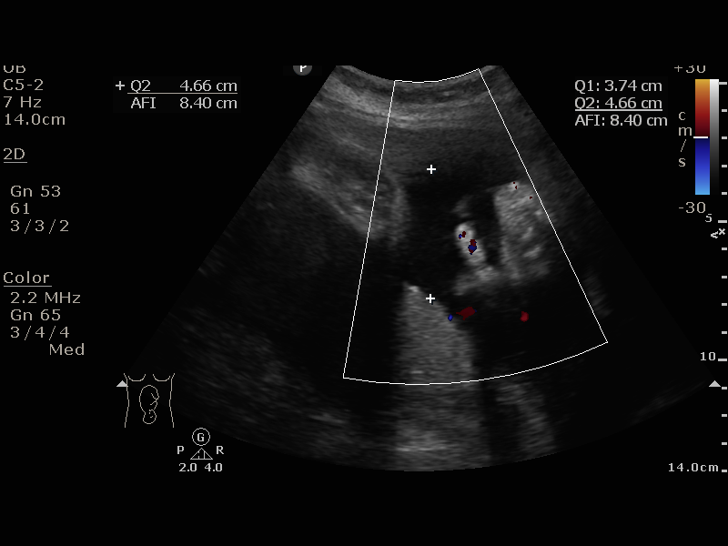
[im 14/15]
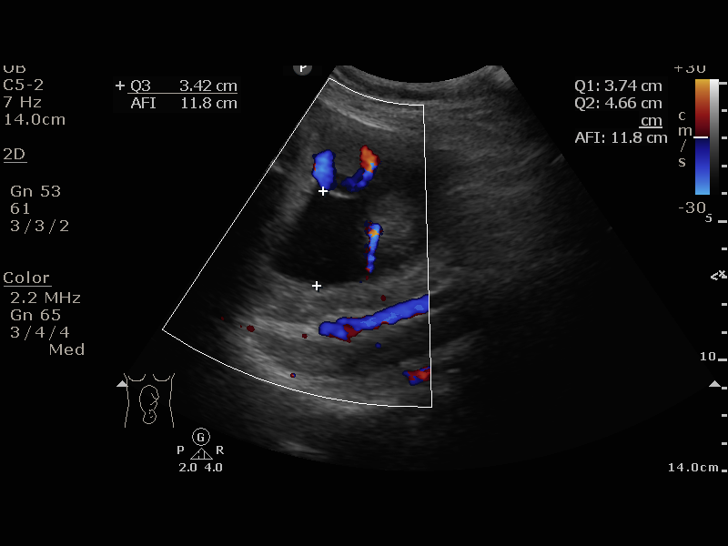
[im 15/15]
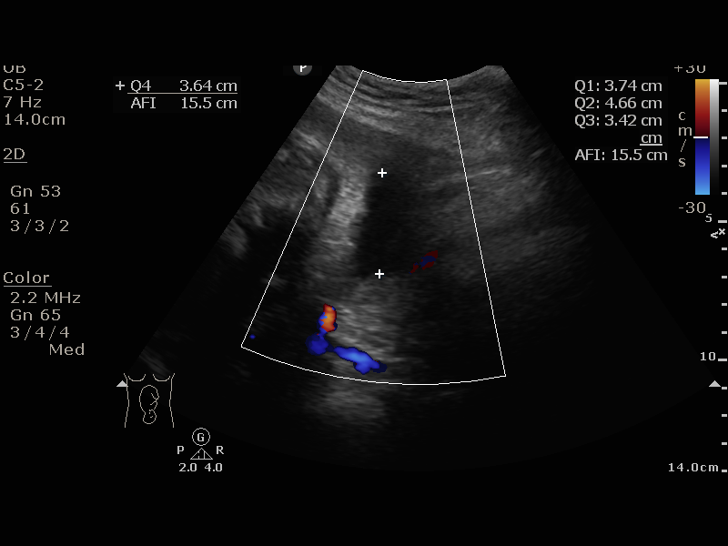

[13 of 15 positions shown; findings below may reference images not displayed]

[address]
                                                            Women's
                                                            [REDACTED]

  1  US FETAL BPP W/NONSTRESS             76818.4      ARISSA BILLIOT
 ----------------------------------------------------------------------

 ----------------------------------------------------------------------
Service(s) Provided

 ----------------------------------------------------------------------
Indications

  36 weeks gestation of pregnancy
  Gestational hypertension without significant
  proteinuria, third trimester
 ----------------------------------------------------------------------
Vital Signs

                                                Height:        5'0"
Fetal Evaluation

 Num Of Fetuses:         1
 Preg. Location:         Intrauterine
 Cardiac Activity:       Observed
 Presentation:           Cephalic

 Amniotic Fluid
 AFI FV:      Within normal limits

 AFI Sum(cm)     %Tile       Largest Pocket(cm)
 15.46           57

 RUQ(cm)       RLQ(cm)       LUQ(cm)        LLQ(cm)

Biophysical Evaluation

 Amniotic F.V:   Pocket => 2 cm two         F. Tone:        Observed
                 planes
 F. Movement:    Observed                   N.S.T:          Reactive
 F. Breathing:   Observed                   Score:          [DATE]
OB History

 Gravidity:    4          SAB:   3
Gestational Age

 LMP:           36w 2d        Date:  08/27/18                 EDD:   06/03/19
 Best:          36w 2d     Det. By:  LMP  (08/27/18)          EDD:   06/03/19
Impression

 36 weeks, Gestational hypertension- reassuring testing
Recommendations

 Weekly BPP until delivery
                  Ching, Eulalio

## 2019-12-30 ENCOUNTER — Encounter (HOSPITAL_COMMUNITY): Payer: Self-pay | Admitting: Psychiatry

## 2019-12-30 ENCOUNTER — Other Ambulatory Visit: Payer: Self-pay

## 2019-12-30 ENCOUNTER — Ambulatory Visit (INDEPENDENT_AMBULATORY_CARE_PROVIDER_SITE_OTHER): Payer: Medicaid Other | Admitting: Psychiatry

## 2019-12-30 DIAGNOSIS — F411 Generalized anxiety disorder: Secondary | ICD-10-CM

## 2019-12-30 DIAGNOSIS — F4310 Post-traumatic stress disorder, unspecified: Secondary | ICD-10-CM

## 2019-12-30 MED ORDER — CITALOPRAM HYDROBROMIDE 20 MG PO TABS: 20.0000 mg | ORAL_TABLET | Freq: Every day | ORAL | 0 refills | Status: DC
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
## 2020-01-05 ENCOUNTER — Encounter: Payer: Self-pay | Admitting: Physical Therapy

## 2020-01-05 ENCOUNTER — Ambulatory Visit: Payer: Medicaid Other | Attending: Urology | Admitting: Physical Therapy

## 2020-01-05 ENCOUNTER — Other Ambulatory Visit: Payer: Self-pay

## 2020-01-05 DIAGNOSIS — R252 Cramp and spasm: Secondary | ICD-10-CM

## 2020-01-05 DIAGNOSIS — M6281 Muscle weakness (generalized): Secondary | ICD-10-CM | POA: Diagnosis present

## 2020-01-05 HISTORY — PX: WRIST SURGERY: SHX841
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
## 2020-01-06 ENCOUNTER — Ambulatory Visit (INDEPENDENT_AMBULATORY_CARE_PROVIDER_SITE_OTHER): Payer: Medicaid Other | Admitting: Licensed Clinical Social Worker

## 2020-01-06 ENCOUNTER — Other Ambulatory Visit: Payer: Self-pay

## 2020-01-06 DIAGNOSIS — F319 Bipolar disorder, unspecified: Secondary | ICD-10-CM

## 2020-01-06 DIAGNOSIS — F4310 Post-traumatic stress disorder, unspecified: Secondary | ICD-10-CM

## 2020-01-06 DIAGNOSIS — F411 Generalized anxiety disorder: Secondary | ICD-10-CM
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
## 2020-01-12 ENCOUNTER — Other Ambulatory Visit: Payer: Self-pay | Admitting: Surgery

## 2020-01-12 DIAGNOSIS — N6452 Nipple discharge: Secondary | ICD-10-CM

## 2020-01-14 ENCOUNTER — Ambulatory Visit: Payer: Medicaid Other | Admitting: Physical Therapy

## 2020-01-14 ENCOUNTER — Other Ambulatory Visit: Payer: Self-pay

## 2020-01-14 DIAGNOSIS — R252 Cramp and spasm: Secondary | ICD-10-CM

## 2020-01-14 DIAGNOSIS — M6281 Muscle weakness (generalized): Secondary | ICD-10-CM

## 2020-01-14 NOTE — Patient Instructions (Signed)
Access Code: [email or web address]  URL: [email or web address]  Date: 01/14/2020  Prepared by: Jari Favre   Exercises Supine Double Knee to Chest - 10 reps - 3 sets - 1x daily - 7x weekly Hooklying Single Knee to Chest - 10 reps - 3 sets - 1x daily - 7x weekly DNS Bug Bracing March - 10 reps - 3 sets - 1x daily - 7x weekly Side Plank on Knees - 10 reps - 3 sets - 1x daily - 7x weekly
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
## 2020-01-15 ENCOUNTER — Other Ambulatory Visit: Payer: Self-pay | Admitting: Surgery

## 2020-01-15 DIAGNOSIS — N6452 Nipple discharge: Secondary | ICD-10-CM

## 2020-01-19 ENCOUNTER — Encounter: Payer: Medicaid Other | Admitting: Physical Therapy

## 2020-01-19 ENCOUNTER — Telehealth: Payer: Self-pay | Admitting: Family Medicine

## 2020-01-19 ENCOUNTER — Ambulatory Visit (INDEPENDENT_AMBULATORY_CARE_PROVIDER_SITE_OTHER): Payer: Medicaid Other | Admitting: Licensed Clinical Social Worker

## 2020-01-19 ENCOUNTER — Other Ambulatory Visit: Payer: Self-pay

## 2020-01-19 DIAGNOSIS — F4310 Post-traumatic stress disorder, unspecified: Secondary | ICD-10-CM | POA: Diagnosis not present

## 2020-01-19 DIAGNOSIS — F319 Bipolar disorder, unspecified: Secondary | ICD-10-CM | POA: Diagnosis not present

## 2020-01-19 DIAGNOSIS — F411 Generalized anxiety disorder: Secondary | ICD-10-CM | POA: Diagnosis not present

## 2020-01-19 NOTE — Progress Notes (Signed)
Virtual Visit via Video Note  I connected withKatherine "[name]" [id number] Webex video applicationand verified that I am speaking with the correct person using two identifiers.  I discussed the limitations, risks, security and privacy concerns of performing an evaluation and management service by telephone and the availability of in person appointments. I also discussed with the patient that there may be a patient responsible charge related to this service. The patient expressed understanding and agreed to proceed.  I discussed the assessment and treatment plan with the patient. The patient was provided an opportunity to ask questions and all were answered. The patient agreed with the plan and demonstrated an understanding of the instructions.  The patient was advised to call back or seek an in-person evaluation if the symptoms worsen or if the condition fails to improve as anticipated.  I provided1 hour of non-face-to-face time during this encounter.   Shade Flood, LCSW, LCAS _____________________________ THERAPIST PROGRESS NOTE  Session Time:11:00am - 12:00pm   Participation Level:Active  Behavioral Response:Alert, casually dressed, euthymic mood  Type of Therapy: Individual Therapy  Treatment Goals addressed:Medication compliance; Self care routine; Resolving Interpersonal Issues in Relationship and Processing Upcoming Transitions  Interventions:CBT, solution focused   Summary: Regnia, who prefers to go by [employer]" presented for Webex therapy session today.  [name] presented on time, alert, oriented x5, with no evidence or self-report of SI/HI or A/V H. [name] reported compliance with medications and stated "I checked in with my doctor the other day and they said if things seem to be working, they won't change the dose right now".  [name] reported scores of 3/10 for both depression and anxiety today and stated "I think my anxiety is  mostly due to some health issues I have going on".  [name] reported progress in working out x2 per week for 30-40 minutes on average, keeping in touch with PCP, engaging in self-care by having a multi-day girls vacation over the past weekend, and talking with her boyfriend about her hesitations regarding upcoming move to [place].  [name] reported that she had not planned to discuss her past trauma with her friends over the weekend, but they ended up having a deep discussion about this and several acknowledged similar experiences, which led to some degree of normalization and healing.  [name] reported that her boyfriend was receptive to her concerns about the recent move, and they decided that they would take some time to consider how to reach a compromise which is agreeable to both of them so that they can move forward with her feeling more comfortable about this decision.  [name] reported that her major concerns at this time are committing to moving across states and into a home without being married; her boyfriend not having a job completely lined up with benefits yet; not having daycare set up for her son or a pediatrician; deciding when to have Medicaid transferred to another state and how this could impact her medical needs; securing a new, safer, more reliable car for her; and finding a way to establish healthy balance in personal time for both her and the boyfriend.  [name] reported that she has no problem with being social and vocalizing needs, but her boyfriend has always been more introverted and she has had to make efforts to get him to open up.  [name] reported that she would bring these concerns up to him this evening, inquire about his own concerns with empathy, and try to negotiate so that they will both be please with outcome  of mutual decision.  [name] agreed to follow up in 2 weeks.      Suicidal/Homicidal: None, without plan or intent.     Therapist Response: Clinician spoke with [name] today  via [employer].  Clinician assessed for safety and medication compliance.  Clinician inquired about [name]'s present emotional ratings, as well as any significant changes in thoughts, feelings, or behavior since last conversation. Clinician inquired about progress [name] has made towards her treatment goals at this time, as well as present barriers.  Clinician praised [name] for ongoing success and inquired about what she would like to focus on today.  Clinician assisted [name] in organizing and prioritizing a list of concerns that she has regarding the upcoming move to [place], and strategizing how to communicate this to her boyfriend tonight so that they can work together to minimize feelings of related anxiety and strengthen understanding between them.  Clinician will continue to monitor.  Plan:Follow up again virtually in two weeks.   Diagnosis: Generalized Anxiety Disorder, PTSD, and Bipolar I disorder  Shade Flood, LCSW, LCAS 01/19/20

## 2020-01-19 NOTE — Telephone Encounter (Signed)
Patient is requesting a call back about some questions she has.

## 2020-01-20 ENCOUNTER — Ambulatory Visit
Admission: RE | Admit: 2020-01-20 | Discharge: 2020-01-20 | Disposition: A | Discharge status: Home / Self Care | Payer: Medicaid Other | Source: Ambulatory Visit | Attending: Surgery | Admitting: Surgery

## 2020-01-20 ENCOUNTER — Other Ambulatory Visit: Payer: Self-pay | Admitting: Surgery

## 2020-01-20 ENCOUNTER — Other Ambulatory Visit: Payer: Self-pay

## 2020-01-20 DIAGNOSIS — R222 Localized swelling, mass and lump, trunk: Secondary | ICD-10-CM

## 2020-01-20 DIAGNOSIS — N6452 Nipple discharge: Secondary | ICD-10-CM

## 2020-01-21 NOTE — Telephone Encounter (Signed)
I called Whitney Gonzalez and she explained she was expecting to get a call with an follow up US appointment for her ovarian cyst. Per chart Dr. Ernestina Patches does need follow up scheduled and was already ordered. I scheduled for 2/ 24/21 and informed Whitney Gonzalez. She voices understanding. Kaitlan Bin,RN

## 2020-01-26 ENCOUNTER — Encounter: Payer: Medicaid Other | Admitting: Physical Therapy

## 2020-01-28 ENCOUNTER — Telehealth: Payer: Self-pay | Admitting: Family Medicine

## 2020-01-28 ENCOUNTER — Other Ambulatory Visit: Payer: Self-pay

## 2020-01-28 ENCOUNTER — Ambulatory Visit (HOSPITAL_COMMUNITY)
Admission: RE | Admit: 2020-01-28 | Discharge: 2020-01-28 | Disposition: A | Discharge status: Home / Self Care | Payer: Medicaid Other | Source: Ambulatory Visit | Attending: Family Medicine | Admitting: Family Medicine

## 2020-01-28 DIAGNOSIS — R102 Pelvic and perineal pain: Secondary | ICD-10-CM | POA: Insufficient documentation

## 2020-01-28 DIAGNOSIS — N898 Other specified noninflammatory disorders of vagina: Secondary | ICD-10-CM | POA: Insufficient documentation

## 2020-01-28 NOTE — Telephone Encounter (Signed)
Patient stopped by the office to let the nurses know that this is the last month of her birth control and she will need a refill.

## 2020-01-29 ENCOUNTER — Other Ambulatory Visit: Payer: Self-pay | Admitting: Family Medicine

## 2020-01-29 DIAGNOSIS — N83201 Unspecified ovarian cyst, right side: Secondary | ICD-10-CM

## 2020-01-29 MED ORDER — NORETHINDRONE 0.35 MG PO TABS: 1.0000 | ORAL_TABLET | Freq: Every day | ORAL | 3 refills | Status: DC

## 2020-01-29 NOTE — Addendum Note (Signed)
Addended by: Langston Reusing on: 01/29/2020 12:42 PM   Modules accepted: Orders

## 2020-01-29 NOTE — Telephone Encounter (Signed)
Per consult w/Dr. Ernestina Patches, refill of Micronor approved for 1 year. [place] results from 2/24 were reviewed nad recommendation was made for follow up trans vag Korea in 10 wks - scheduled on 5/5 @ 1100.  I called pt and informed her of Rx refill as well as [place] results and follow up scan on 5/5. Pt was advised that the next [place] will be in our new office location - directions given. She voiced understanding and agreed to plan of care.

## 2020-02-02 ENCOUNTER — Ambulatory Visit: Payer: Medicaid Other | Attending: Urology | Admitting: Physical Therapy

## 2020-02-02 ENCOUNTER — Other Ambulatory Visit: Payer: Self-pay

## 2020-02-02 ENCOUNTER — Encounter: Payer: Self-pay | Admitting: Physical Therapy

## 2020-02-02 DIAGNOSIS — R252 Cramp and spasm: Secondary | ICD-10-CM | POA: Insufficient documentation

## 2020-02-02 DIAGNOSIS — M6281 Muscle weakness (generalized): Secondary | ICD-10-CM | POA: Diagnosis present
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
## 2020-02-09 ENCOUNTER — Ambulatory Visit: Payer: Medicaid Other | Admitting: Physical Therapy

## 2020-02-09 ENCOUNTER — Encounter: Payer: Self-pay | Admitting: Physical Therapy

## 2020-02-09 ENCOUNTER — Other Ambulatory Visit: Payer: Self-pay

## 2020-02-09 DIAGNOSIS — R252 Cramp and spasm: Secondary | ICD-10-CM

## 2020-02-09 DIAGNOSIS — M6281 Muscle weakness (generalized): Secondary | ICD-10-CM

## 2020-02-09 NOTE — Patient Instructions (Signed)
Access Code: [email or web address] URL: [email or web address] Date: 02/09/2020 Prepared by: Jari Favre  Exercises Supine Double Knee to Chest - 10 reps - 3 sets - 1x daily - 7x weekly Hooklying Single Knee to Chest - 10 reps - 3 sets - 1x daily - 7x weekly DNS Bug Bracing March - 10 reps - 3 sets - 1x daily - 7x weekly Side Plank on Knees - 10 reps - 3 sets - 1x daily - 7x weekly Seated Piriformis Stretch with Trunk Bend - 3 reps - 1 sets - 30 sec hold - 1x daily - 7x weekly Seated Pelvic Clock on Balance Disk - 10 reps - 3 sets - 1x daily - 7x weekly Cat-Camel to Child's Pose - 5 reps - 1 sets - 10 sec hold - 1x daily - 7x weekly Hooklying Hamstring Stretch with Doorway - 10 reps - 3 sets - 1x daily - 7x weekly
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
## 2020-02-23 ENCOUNTER — Ambulatory Visit: Payer: Medicaid Other | Admitting: Physical Therapy

## 2020-03-01 ENCOUNTER — Ambulatory Visit: Payer: Medicaid Other | Admitting: Physical Therapy

## 2020-03-01 ENCOUNTER — Encounter (HOSPITAL_COMMUNITY): Payer: Self-pay | Admitting: Psychiatry

## 2020-03-01 ENCOUNTER — Ambulatory Visit (INDEPENDENT_AMBULATORY_CARE_PROVIDER_SITE_OTHER): Payer: Medicaid Other | Admitting: Psychiatry

## 2020-03-01 ENCOUNTER — Encounter: Payer: Self-pay | Admitting: Physical Therapy

## 2020-03-01 ENCOUNTER — Other Ambulatory Visit: Payer: Self-pay

## 2020-03-01 DIAGNOSIS — F411 Generalized anxiety disorder: Secondary | ICD-10-CM | POA: Diagnosis not present

## 2020-03-01 DIAGNOSIS — R252 Cramp and spasm: Secondary | ICD-10-CM

## 2020-03-01 DIAGNOSIS — F4310 Post-traumatic stress disorder, unspecified: Secondary | ICD-10-CM | POA: Diagnosis not present

## 2020-03-01 DIAGNOSIS — M6281 Muscle weakness (generalized): Secondary | ICD-10-CM

## 2020-03-01 MED ORDER — CITALOPRAM HYDROBROMIDE 20 MG PO TABS: 20.0000 mg | ORAL_TABLET | Freq: Every day | ORAL | 0 refills | Status: DC
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
## 2020-03-08 ENCOUNTER — Ambulatory Visit: Payer: Medicaid Other | Attending: Urology | Admitting: Physical Therapy

## 2020-03-08 ENCOUNTER — Encounter: Payer: Self-pay | Admitting: Physical Therapy

## 2020-03-08 ENCOUNTER — Other Ambulatory Visit: Payer: Self-pay

## 2020-03-08 DIAGNOSIS — R252 Cramp and spasm: Secondary | ICD-10-CM | POA: Diagnosis present

## 2020-03-08 DIAGNOSIS — M6281 Muscle weakness (generalized): Secondary | ICD-10-CM | POA: Diagnosis present
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
## 2020-03-15 ENCOUNTER — Other Ambulatory Visit: Payer: Self-pay

## 2020-03-15 ENCOUNTER — Ambulatory Visit: Payer: Medicaid Other | Admitting: Physical Therapy

## 2020-03-15 ENCOUNTER — Encounter: Payer: Self-pay | Admitting: Physical Therapy

## 2020-03-15 DIAGNOSIS — R252 Cramp and spasm: Secondary | ICD-10-CM | POA: Diagnosis not present

## 2020-03-15 DIAGNOSIS — M6281 Muscle weakness (generalized): Secondary | ICD-10-CM

## 2020-03-15 NOTE — Patient Instructions (Signed)
Access Code: [email or web address] URL: [email or web address] Date: 03/15/2020 Prepared by: Jari Favre  Exercises Supine Double Knee to Chest - 1 x daily - 7 x weekly - 10 reps - 3 sets Hooklying Single Knee to Chest - 1 x daily - 7 x weekly - 10 reps - 3 sets DNS Bug Bracing March - 1 x daily - 7 x weekly - 10 reps - 3 sets Side Plank on Knees - 1 x daily - 7 x weekly - 10 reps - 3 sets Seated Piriformis Stretch with Trunk Bend - 1 x daily - 7 x weekly - 3 reps - 1 sets - 30 sec hold Seated Pelvic Clock on Balance Disk - 1 x daily - 7 x weekly - 10 reps - 3 sets Cat-Camel to Child's Pose - 1 x daily - 7 x weekly - 5 reps - 1 sets - 10 sec hold Hooklying Hamstring Stretch with Doorway - 1 x daily - 7 x weekly - 10 reps - 3 sets Tandem Stance - 1 x daily - 7 x weekly - 1 sets - 10 reps - 5 hold Beginner Clam - 1 x daily - 7 x weekly - 10 reps - 3 sets Side Plank on Elbow with Rotation - 1 x daily - 7 x weekly - 10 reps - 3 sets Quadruped Thoracic Rotation with Hand on Neck - 1 x daily - 7 x weekly - 10 reps - 3 sets
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
## 2020-03-22 ENCOUNTER — Other Ambulatory Visit: Payer: Self-pay

## 2020-03-22 ENCOUNTER — Ambulatory Visit: Payer: Medicaid Other | Admitting: Physical Therapy

## 2020-03-22 ENCOUNTER — Telehealth: Payer: Self-pay | Admitting: Diagnostic Neuroimaging

## 2020-03-22 ENCOUNTER — Encounter: Payer: Self-pay | Admitting: Physical Therapy

## 2020-03-22 ENCOUNTER — Encounter: Payer: Self-pay | Admitting: Diagnostic Neuroimaging

## 2020-03-22 DIAGNOSIS — M6281 Muscle weakness (generalized): Secondary | ICD-10-CM

## 2020-03-22 DIAGNOSIS — R252 Cramp and spasm: Secondary | ICD-10-CM

## 2020-03-22 NOTE — Telephone Encounter (Signed)
Called patient and rescheduled FU for this Wed. She  verbalized understanding, appreciation.
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
## 2020-03-23 ENCOUNTER — Ambulatory Visit (INDEPENDENT_AMBULATORY_CARE_PROVIDER_SITE_OTHER): Payer: Medicaid Other | Admitting: Licensed Clinical Social Worker

## 2020-03-23 ENCOUNTER — Other Ambulatory Visit: Payer: Self-pay

## 2020-03-23 DIAGNOSIS — F319 Bipolar disorder, unspecified: Secondary | ICD-10-CM

## 2020-03-23 DIAGNOSIS — F411 Generalized anxiety disorder: Secondary | ICD-10-CM

## 2020-03-23 DIAGNOSIS — F4310 Post-traumatic stress disorder, unspecified: Secondary | ICD-10-CM | POA: Diagnosis not present

## 2020-03-23 NOTE — Progress Notes (Signed)
Virtual Visit via Video Note   I connected with Whitney Gonzalez on 03/23/20 at 11:00am by [employer] video application and verified that I am speaking with the correct person using two identifiers.   I discussed the limitations, risks, security and privacy concerns of performing an evaluation and management service by telephone and the availability of in person appointments. I also discussed with the patient that there may be a patient responsible charge related to this service. The patient expressed understanding and agreed to proceed.   I discussed the assessment and treatment plan with the patient. The patient was provided an opportunity to ask questions and all were answered. The patient agreed with the plan and demonstrated an understanding of the instructions.   The patient was advised to call back or seek an in-person evaluation if the symptoms worsen or if the condition fails to improve as anticipated.   I provided 1 hour of non-face-to-face time during this encounter.     Shade Flood, LCSW, LCAS _____________________________ THERAPIST PROGRESS NOTE   Session Time: 11:00am - 12:00pm    Participation Level: Active    Behavioral Response: Alert, casually dressed, anxious mood   Type of Therapy:  Individual Therapy   Treatment Goals addressed: Medication compliance; Psychiatry follow-up; Self care routine; Exercise; Resolving Interpersonal Issues in Relationship and Processing Upcoming Transitions   Interventions: CBT, mindfulness, treatment planning   Summary: Whitney Gonzalez is a 29 year old Caucasian female that prefers to go by "Whitney Gonzalez" and presented for Webex therapy session today with Generalized Anxiety Disorder, PTSD, and Bipolar I disorder.      Suicidal/Homicidal: None, without plan or intent.     Therapist Response: Clinician spoke with Whitney Gonzalez today via [employer].  Clinician assessed for safety and medication compliance.  Whitney Gonzalez presented to  virtual appointment on time and was alert, oriented x5, with no evidence or self-report of SI/HI or A/V H.  She reported that she remains compliant with current medication.  Clinician inquired about Whitney Gonzalez's emotional ratings today, as well as any significant changes in thoughts, feelings, or behavior since previous meeting. Whitney Gonzalez reported scores of 0/10 for depression and 3/10 for anxiety today, stating "I have been a little lonelier recently and that's made me sad".  Clinician inquired about progress Whitney Gonzalez has made towards treatment goals in recent weeks, as well as current challenges.  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  Whitney Gonzalez reported that since this event, she has had some memory issues, tingling in her arms/hands, and finds it hard to focus on activities at times.  She reported that she is in touch with a neurologist to address this concern, and will be speaking with them this week about symptoms.  Whitney Gonzalez reported that she and her partner have decided to move to [place] after all, so he has been spending weekends traveling there to renovate the home in preparation for an anticipated move by late June at the earliest.  Whitney Gonzalez reported that she is working to focus on the positives and stay optimistic about this transition following earlier session with clinician weighing pros and cons.  Whitney Gonzalez reported that she also recently discussed past trauma history with her partner for the first time, and this "Took a weight  off my shoulders" and appears to have strengthened the relationship between them.  Clinician offered to teach Whitney Gonzalez mindful breathing relaxation technique today to aid in anxiety management and improving of focus on daily tasks following  recent medical concerns.  Whitney Gonzalez was agreeable to this, so clinician guided her through process of getting comfortable, achieving a relaxed breathing pattern, and then focusing on this rhythm for 10 minutes while allowing thoughts and feelings to pass without rumination.  Whitney Gonzalez participated in activity and reported that it was effective in calming her down to the extent that she felt like she could fall asleep.  Whitney Gonzalez reported that she would practice this once per day before bed to develop routine.  Clinician also noted that 90 days in treatment have now passed and proposed revisiting treatment plan to make revisions/updates as needed.  Whitney Gonzalez collaborated with clinician to modify treatment plan as follows:  Meet with clinician virtually once per month for therapy, to identify where progress has been made, and any needs that need to be addressed; Meet with psychiatrist virtually once every 2-3 months to check on efficacy of medication and any changes needed for dose/regimen if necessary; Taking medication as prescribed daily, consistently for maximum benefit in addressing symptom reduction and daily functioning; Reduce anxiety from average of 5/10 in severity down to 3/10 in next 90 days by engaging in relaxation techniques 2-3 times per day such as mindful breathing, progressive muscle relaxation, and/or guided imagery; Reduce depression from average of 6/10 in severity down to 3/10 in next 90 days by engaging in 1-2 hours of positive activities daily, such as reading, writing, socialization with friends, and/or getting outside for fresh air; Exercise 1 day per week for 1 hour depending upon energy level/exertion from work schedule to improve mental and physical wellbeing; Commit to writing in journal at least once per week, for one page, with goal of increasing self-awareness of thoughts, feelings, behavioral changes during treatment, and strategies which have been effective day to day in improving mood; Maintain  part-time employment as a Theatre manager for 20 hours per week, with goal of increasing financial security, productivity day to day, and 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  Whitney Gonzalez agreed to follow up in 78 days to revisit treatment plan again.   Clinician will continue to monitor.   Plan: Follow up again virtually in 1 month.   Diagnosis: Generalized Anxiety Disorder, PTSD, and Bipolar I disorder   Shade Flood, LCSW, LCAS 03/23/20
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
## 2020-03-24 ENCOUNTER — Encounter: Payer: Self-pay | Admitting: Diagnostic Neuroimaging

## 2020-03-24 ENCOUNTER — Ambulatory Visit: Payer: Medicaid Other | Admitting: Diagnostic Neuroimaging

## 2020-03-24 ENCOUNTER — Other Ambulatory Visit: Payer: Self-pay

## 2020-03-24 ENCOUNTER — Telehealth: Payer: Self-pay | Admitting: Diagnostic Neuroimaging

## 2020-03-24 VITALS — BP 110/76 | HR 80 | Temp 97.7°F | Ht 60.0 in | Wt 147.0 lb

## 2020-03-24 DIAGNOSIS — R200 Anesthesia of skin: Secondary | ICD-10-CM

## 2020-03-24 DIAGNOSIS — R413 Other amnesia: Secondary | ICD-10-CM | POA: Diagnosis not present

## 2020-03-24 NOTE — Telephone Encounter (Signed)
medicaid order sent to [facility]. They will obtain the auth and reach out to the patient to schedule.  °
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
## 2020-03-26 ENCOUNTER — Other Ambulatory Visit: Payer: Self-pay

## 2020-03-26 ENCOUNTER — Encounter (HOSPITAL_COMMUNITY): Payer: Self-pay | Admitting: Pediatrics

## 2020-03-26 ENCOUNTER — Observation Stay (HOSPITAL_COMMUNITY)
Admission: EM | Admit: 2020-03-26 | Discharge: 2020-03-28 | Disposition: A | Discharge status: Home / Self Care | Payer: Medicaid Other | Attending: Internal Medicine | Admitting: Internal Medicine

## 2020-03-26 ENCOUNTER — Emergency Department (HOSPITAL_COMMUNITY): Payer: Medicaid Other

## 2020-03-26 ENCOUNTER — Telehealth: Payer: Self-pay | Admitting: Diagnostic Neuroimaging

## 2020-03-26 DIAGNOSIS — N189 Chronic kidney disease, unspecified: Secondary | ICD-10-CM | POA: Diagnosis not present

## 2020-03-26 DIAGNOSIS — R202 Paresthesia of skin: Secondary | ICD-10-CM

## 2020-03-26 DIAGNOSIS — R262 Difficulty in walking, not elsewhere classified: Secondary | ICD-10-CM | POA: Insufficient documentation

## 2020-03-26 DIAGNOSIS — R6889 Other general symptoms and signs: Secondary | ICD-10-CM

## 2020-03-26 DIAGNOSIS — Z20822 Contact with and (suspected) exposure to covid-19: Secondary | ICD-10-CM | POA: Insufficient documentation

## 2020-03-26 DIAGNOSIS — R4189 Other symptoms and signs involving cognitive functions and awareness: Secondary | ICD-10-CM | POA: Diagnosis not present

## 2020-03-26 DIAGNOSIS — E039 Hypothyroidism, unspecified: Secondary | ICD-10-CM | POA: Diagnosis present

## 2020-03-26 DIAGNOSIS — R5382 Chronic fatigue, unspecified: Secondary | ICD-10-CM

## 2020-03-26 DIAGNOSIS — J45909 Unspecified asthma, uncomplicated: Secondary | ICD-10-CM | POA: Diagnosis not present

## 2020-03-26 DIAGNOSIS — R531 Weakness: Secondary | ICD-10-CM

## 2020-03-26 DIAGNOSIS — F319 Bipolar disorder, unspecified: Secondary | ICD-10-CM

## 2020-03-26 LAB — CBC WITH DIFFERENTIAL/PLATELET
Abs Immature Granulocytes: 0.02 10*3/uL (ref 0.00–0.07)
Basophils Absolute: 0 10*3/uL (ref 0.0–0.1)
Basophils Relative: 0 %
Eosinophils Absolute: 0.2 10*3/uL (ref 0.0–0.5)
Eosinophils Relative: 2 %
HCT: 42 % (ref 36.0–46.0)
Hemoglobin: 14.2 g/dL (ref 12.0–15.0)
Immature Granulocytes: 0 %
Lymphocytes Relative: 38 %
Lymphs Abs: 2.5 10*3/uL (ref 0.7–4.0)
MCH: 31.3 pg (ref 26.0–34.0)
MCHC: 33.8 g/dL (ref 30.0–36.0)
MCV: 92.5 fL (ref 80.0–100.0)
Monocytes Absolute: 0.6 10*3/uL (ref 0.1–1.0)
Monocytes Relative: 8 %
Neutro Abs: 3.4 10*3/uL (ref 1.7–7.7)
Neutrophils Relative %: 52 %
Platelets: 262 10*3/uL (ref 150–400)
RBC: 4.54 MIL/uL (ref 3.87–5.11)
RDW: 11.9 % (ref 11.5–15.5)
WBC: 6.7 10*3/uL (ref 4.0–10.5)
nRBC: 0 % (ref 0.0–0.2)

## 2020-03-26 LAB — COMPREHENSIVE METABOLIC PANEL
ALT: 12 U/L (ref 0–44)
AST: 17 U/L (ref 15–41)
Albumin: 4.2 g/dL (ref 3.5–5.0)
Alkaline Phosphatase: 51 U/L (ref 38–126)
Anion gap: 6 (ref 5–15)
BUN: 8 mg/dL (ref 6–20)
CO2: 26 mmol/L (ref 22–32)
Calcium: 9.5 mg/dL (ref 8.9–10.3)
Chloride: 106 mmol/L (ref 98–111)
Creatinine, Ser: 0.5 mg/dL (ref 0.44–1.00)
GFR calc Af Amer: >60 mL/min (ref >60)
GFR calc non Af Amer: >60 mL/min (ref >60)
Glucose, Bld: 111 mg/dL — ABNORMAL HIGH (ref 70–99)
Potassium: 4.1 mmol/L (ref 3.5–5.1)
Sodium: 138 mmol/L (ref 135–145)
Total Bilirubin: 0.3 mg/dL (ref 0.3–1.2)
Total Protein: 7 g/dL (ref 6.5–8.1)

## 2020-03-26 LAB — I-STAT BETA HCG BLOOD, ED (MC, WL, AP ONLY): I-stat hCG, quantitative: <5 m[IU]/mL (ref <5)

## 2020-03-26 NOTE — Telephone Encounter (Signed)
Pt has called back to report that she spoke with her PCP and they advised her to have phone staff to message the on call Dr due to pt legs feeling numb since this morning and it yet to be able to move her legs and feet.
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
## 2020-03-26 NOTE — ED Triage Notes (Signed)
Patient was referred by OP PCP; states she needs MRI; c/o bilateral leg numbness since 10 AM; stated she's been referred to neurologist since nov 2020 and has not had an official diagnosis in regards to neuro symptoms. Patient is ambulatory in triage. States she is able to walk; and states it feels heavier.

## 2020-03-26 NOTE — Telephone Encounter (Signed)
Pt called stating that her legs are completely numb and is wanting to be advised what she can do to take care of this until her next appt. Please advise.

## 2020-03-26 NOTE — ED Notes (Signed)
Pt is in MRI  

## 2020-03-27 ENCOUNTER — Emergency Department (HOSPITAL_COMMUNITY): Payer: Medicaid Other

## 2020-03-27 DIAGNOSIS — R531 Weakness: Secondary | ICD-10-CM | POA: Diagnosis not present

## 2020-03-27 DIAGNOSIS — R202 Paresthesia of skin: Secondary | ICD-10-CM

## 2020-03-27 LAB — CSF CELL COUNT WITH DIFFERENTIAL
Eosinophils, CSF: 5 % — ABNORMAL HIGH (ref 0–1)
Lymphs, CSF: 25 % — ABNORMAL LOW (ref 40–80)
Monocyte-Macrophage-Spinal Fluid: 7 % — ABNORMAL LOW (ref 15–45)
RBC Count, CSF: 14063 /mm3 — ABNORMAL HIGH
Segmented Neutrophils-CSF: 63 % — ABNORMAL HIGH (ref 0–6)
Tube #: 3
WBC, CSF: 17 /mm3 (ref 0–5)

## 2020-03-27 LAB — HIV ANTIBODY (ROUTINE TESTING W REFLEX): HIV Screen 4th Generation wRfx: NONREACTIVE

## 2020-03-27 LAB — TSH: TSH: 6.415 u[IU]/mL — ABNORMAL HIGH (ref 0.350–4.500)

## 2020-03-27 LAB — GLUCOSE, CSF: Glucose, CSF: 61 mg/dL (ref 40–70)

## 2020-03-27 LAB — PROTEIN, CSF: Total  Protein, CSF: 26 mg/dL (ref 15–45)

## 2020-03-27 LAB — VITAMIN B12: Vitamin B-12: 825 pg/mL (ref 180–914)

## 2020-03-27 LAB — SARS CORONAVIRUS 2 (TAT 6-24 HRS): SARS Coronavirus 2: NEGATIVE

## 2020-03-27 MED ORDER — POLYETHYLENE GLYCOL 3350 17 G PO PACK: 17.0000 g | PACK | Freq: Every day | ORAL | Status: DC | PRN

## 2020-03-27 MED ORDER — SODIUM CHLORIDE 0.9% FLUSH: 3.0000 mL | Freq: Two times a day (BID) | INTRAVENOUS | Status: DC

## 2020-03-27 MED ORDER — SODIUM CHLORIDE 0.9 % IV BOLUS
1000.0000 mL | Freq: Once | INTRAVENOUS | Status: AC
  Administered 2020-03-27: 1000 mL via INTRAVENOUS

## 2020-03-27 MED ORDER — GADOBUTROL 1 MMOL/ML IV SOLN
7.0000 mL | Freq: Once | INTRAVENOUS | Status: AC | PRN
  Administered 2020-03-27: 7 mL via INTRAVENOUS

## 2020-03-27 MED ORDER — CITALOPRAM HYDROBROMIDE 20 MG PO TABS
20.0000 mg | ORAL_TABLET | Freq: Every day | ORAL | Status: DC
  Administered 2020-03-27: 20 mg via ORAL
  Filled 2020-03-27: qty 1
  Filled 2020-03-27: qty 2

## 2020-03-27 MED ORDER — LACTATED RINGERS IV SOLN: INTRAVENOUS | Status: DC

## 2020-03-27 MED ORDER — ONDANSETRON HCL 4 MG PO TABS: 4.0000 mg | ORAL_TABLET | Freq: Four times a day (QID) | ORAL | Status: DC | PRN

## 2020-03-27 MED ORDER — ENOXAPARIN SODIUM 40 MG/0.4ML ~~LOC~~ SOLN
40.0000 mg | SUBCUTANEOUS | Status: DC
  Administered 2020-03-27: 40 mg via SUBCUTANEOUS
  Filled 2020-03-27 (×2): qty 0.4

## 2020-03-27 MED ORDER — NORETHINDRONE 0.35 MG PO TABS: 1.0000 | ORAL_TABLET | Freq: Every day | ORAL | Status: DC

## 2020-03-27 MED ORDER — ONDANSETRON HCL 4 MG/2ML IJ SOLN: 4.0000 mg | Freq: Four times a day (QID) | INTRAMUSCULAR | Status: DC | PRN

## 2020-03-27 MED ORDER — HYDRALAZINE HCL 20 MG/ML IJ SOLN: 5.0000 mg | INTRAMUSCULAR | Status: DC | PRN

## 2020-03-27 MED ORDER — ACETAMINOPHEN 650 MG RE SUPP: 650.0000 mg | Freq: Four times a day (QID) | RECTAL | Status: DC | PRN

## 2020-03-27 MED ORDER — ZOLPIDEM TARTRATE 5 MG PO TABS
5.0000 mg | ORAL_TABLET | Freq: Every evening | ORAL | Status: DC | PRN
  Administered 2020-03-28: 5 mg via ORAL
  Filled 2020-03-27: qty 1

## 2020-03-27 MED ORDER — DARIFENACIN HYDROBROMIDE ER 15 MG PO TB24
15.0000 mg | ORAL_TABLET | Freq: Every day | ORAL | Status: DC
  Filled 2020-03-27 (×2): qty 1

## 2020-03-27 MED ORDER — DOCUSATE SODIUM 100 MG PO CAPS
100.0000 mg | ORAL_CAPSULE | Freq: Two times a day (BID) | ORAL | Status: DC
  Administered 2020-03-27: 100 mg via ORAL
  Filled 2020-03-27 (×2): qty 1

## 2020-03-27 MED ORDER — BISACODYL 5 MG PO TBEC: 5.0000 mg | DELAYED_RELEASE_TABLET | Freq: Every day | ORAL | Status: DC | PRN

## 2020-03-27 MED ORDER — ACETAMINOPHEN 325 MG PO TABS
650.0000 mg | ORAL_TABLET | Freq: Four times a day (QID) | ORAL | Status: DC | PRN
  Administered 2020-03-27 – 2020-03-28 (×2): 650 mg via ORAL
  Filled 2020-03-27 (×2): qty 2

## 2020-03-27 NOTE — ED Notes (Signed)
Pt assisted with bedpan. Voided small amount, had soiled brief when changed. Bladder scanned 1ml

## 2020-03-27 NOTE — ED Notes (Signed)
Patient transported to IR for LP 

## 2020-03-27 NOTE — Progress Notes (Addendum)
Same day progress note  Followed up on imaging.  MRI T and L-spine normal. MRI brain and C-spine unremarkable outpatient.  On my examination, patient is awake alert oriented x3 No dysarthria No aphasia Cranial nerve: No deficits except for the fact that she sees double up on horizontal eye movements at times. Motor exam: Upper extremities no deficit.  Flaccid lower extremities-question effort dependent weakness but cannot be sure as it is present bilaterally Sensory exam: She has a sensory level up to the xiphoid. Coordination: Intact finger-nose-finger testing  My exam is a bit different than Whitney Gonzalez exam with sensory level higher up. In a patient who is complaining of ascending paralysis as well as ascending weakness, it is imperative that Guillain-Barr syndrome be ruled out.  I would recommend admission for PT OT and spinal tap. If the spinal tap shows neuropsychological dissociation, I would consider IVIG. In the absence of any CSF abnormalities, further neurological work-up has to be done outpatient and there might be a nonorganic component to it if CSF remains unremarkable because MRI of the brain, C-spine and T-spine along with MRI L-spine are unremarkable.  -- Whitney Portland, MD [facility] Pager: [phone] If 7pm to 7am, please call on call as listed on AMION.   ADDENDUM LP not consistent with GBS. MRI neuraxis unremarkable. Reports subjective improvement with getting rest and fluids-would not happen with any of the considered neurological diagnosis.  Only thing that comes to mind in that situation is myasthenia gravis but it does not selectively involve the lower extremities only. 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  No further inpatient neurological  work-up.  Continue PT OT.  On discharge follow-up with outpatient neurology as needed  I would recommend psychiatry follow-up as well  Spoke with both the parents over the phone in detail with the patient's permission as well as the patient while she was in the hospital over the phone. Discussed my plan in detail and answered all their questions. -- Whitney Portland, MD [facility] Pager: [phone] If 7pm to 7am, please call on call as listed on AMION.

## 2020-03-27 NOTE — ED Provider Notes (Signed)
Clinical Course as of Mar 27 1713  Fri Mar 26, 2020  2304 Case discussed with neuro hospitalist, who agrees with MRI imaging, and would like to discuss these results after they are done, then will determine need for evaluation and help with disposition.   [EW]  Sat Mar 27, 2020  0719 I received signout from Dr Lorelle Formosa the overnight EDP, who had subsequently received signout from Dr Eulis Foster the daytime EDP yesterday.  The patient has been here 17 hours.  She presents with chronic progression of weakness in her lower extremities over the past several months.  She was noted to be ambulating earlier in the day.  However she subsequently has reported an inability to ambulate since then.  She received an MRI of the head and C-spine yesterday evening which were unremarkable.   Neurology was consulted who recommended an RPR blood test and an MR of the lumbar and thoracic spine, which was completed at the start of my shift.  We are waiting on the image report and I will contact neurology again.  Both prior EDP's felt this timeline and the patient's exam were NOT consistent with GBS, and reported she had DTR's intact.  There is no evidence of impending respiratory compromise.  I will touch base with neuro when her imaging is complete.   [MT]  0806 IMPRESSION: Normal MRIs of the thoracic and lumbar spine. No findings to explain patient's symptoms identified.   [MT]  [id number] After reassessment and bedside conversation, Dr Rory Percy from the neurology did recommend an LP and admission.  The patient appears to have had progression of her symptoms now with anesthesia to the upper lumbar region, incontinence, and has no strength in her lower extremities.  I contacted radiology who is agreeable to performing a fluoroscopic LP.  The patient will be admitted to th ehospital while CSF studies are pending, and will need continued monitoring    [MT]  1131 Signed out to Dr Lorin Mercy hospitalist, patient being taken to IR now for LP.   Recommending progressive bed per dr Rory Percy.  Resp status stable here   [MT]    Clinical Course User Index [EW] Daleen Bo, MD [MT] Langston Masker Carola Rhine, MD      Wyvonnia Dusky, MD 03/27/20 [phone]

## 2020-03-27 NOTE — ED Notes (Signed)
Pt states that she can not feel her legs, she states that her legs began feeling heavier and heavier as time has gone on.  Pt states that she has been incontinent of urine, she came in wearing a depend due to this.  Pt was able to void while being placed on bedpan (pt states that she focused on relaxing which allowed her to void) pt was placed in new depend.  Pt states that when she woke up this am she could not feel her toes but she was still able to ambulate.  She states that her dad helped her in to the ER and during the course of the day and her stay her legs began feeling heavier and ambulation became more difficult.  Pt states that she also has a sensation that her feet felt like they were swelling.  Pt reports loss of sensation to her hips (this am it was numbness and tingling from the thighs down)

## 2020-03-27 NOTE — H&P (Signed)
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 [facility] (Director on Call) for the Hospitalists listed on amion for assistance.   03/27/2020, 2:33 PM
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
## 2020-03-27 NOTE — ED Notes (Signed)
Pt returned from MRI, mother is at the bedside. Pt being changed in a ED room (she was placed in a depend due to loss of bladder control)

## 2020-03-27 NOTE — ED Notes (Signed)
Pt was not able to ambulate, attempted to help her to stand and she was not able to ambulate as she can not feel her legs

## 2020-03-27 NOTE — ED Notes (Signed)
Pt reports she is beginning to feel her toes again. She reports being able to move them but there is a delayed response

## 2020-03-27 NOTE — ED Notes (Signed)
Pt changed from wet depends. She was able to use the bedpan and put out 343mL. Changed into a clean depends and told to let staff know if she needs to use the bathroom.

## 2020-03-27 NOTE — ED Provider Notes (Signed)
[id number] Patient stating she cannot walk, has reflexes.  Seen by Dr. Cheral Marker who recommends T/L MRI with contrast and an RPR.     Kevontae Burgoon, MD 03/27/20 [id number]

## 2020-03-27 NOTE — ED Notes (Signed)
Pt taken to MRI  

## 2020-03-27 NOTE — Procedures (Signed)
Lumbar Puncture Procedure Note  Pre-operative Diagnosis: lower extremity weakness  Post-operative Diagnosis: same  Indications: Diagnostic  Procedure Details   Consent: Informed consent was obtained. Risks of the procedure were discussed including: infection, bleeding, pain and headache.  The patient was positioned under sterile conditions. Betadine solution and sterile drapes were utilized. A20G spinal needle was inserted at the L4-L5 interspace.  Spinal fluid was obtained and sent to the laboratory.  Findings 41mL of blood tinged spinal fluid was obtained. Opening Pressure: 22 cm H2O pressure.   Complications:  None; patient tolerated the procedure well.        Condition: stable  Plan Bed rest for 4 hours. Tylenol 650 mg for pain.

## 2020-03-27 NOTE — ED Notes (Signed)
PAGED DR Cheral Marker TO DR Beatris Si

## 2020-03-28 ENCOUNTER — Encounter (HOSPITAL_COMMUNITY): Payer: Self-pay | Admitting: Internal Medicine

## 2020-03-28 DIAGNOSIS — R531 Weakness: Secondary | ICD-10-CM

## 2020-03-28 DIAGNOSIS — R202 Paresthesia of skin: Secondary | ICD-10-CM | POA: Diagnosis not present

## 2020-03-28 LAB — RPR: RPR Ser Ql: NONREACTIVE

## 2020-03-28 LAB — BASIC METABOLIC PANEL
Anion gap: 7 (ref 5–15)
BUN: 8 mg/dL (ref 6–20)
CO2: 25 mmol/L (ref 22–32)
Calcium: 9.2 mg/dL (ref 8.9–10.3)
Chloride: 108 mmol/L (ref 98–111)
Creatinine, Ser: 0.59 mg/dL (ref 0.44–1.00)
GFR calc Af Amer: >60 mL/min (ref >60)
GFR calc non Af Amer: >60 mL/min (ref >60)
Glucose, Bld: 92 mg/dL (ref 70–99)
Potassium: 3.7 mmol/L (ref 3.5–5.1)
Sodium: 140 mmol/L (ref 135–145)

## 2020-03-28 LAB — T4, FREE: Free T4: 0.87 ng/dL (ref 0.61–1.12)

## 2020-03-28 LAB — CBC
HCT: 37 % (ref 36.0–46.0)
Hemoglobin: 12.7 g/dL (ref 12.0–15.0)
MCH: 31.5 pg (ref 26.0–34.0)
MCHC: 34.3 g/dL (ref 30.0–36.0)
MCV: 91.8 fL (ref 80.0–100.0)
Platelets: 220 10*3/uL (ref 150–400)
RBC: 4.03 MIL/uL (ref 3.87–5.11)
RDW: 11.8 % (ref 11.5–15.5)
WBC: 5.6 10*3/uL (ref 4.0–10.5)
nRBC: 0 % (ref 0.0–0.2)
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
## 2020-03-28 NOTE — Progress Notes (Signed)
   03/28/20   To Whom it may concern,  Whitney Gonzalez was admitted to [facility] on 03/26/2020 and remained under my care in the hospital through 03/28/20. She should be excused from work during this period. Additionally, she will require ongoing physical therapy following her discharge. At this time I am not able to determine if she will be physically able to return to work on 04/02/2020. She will need to be re-evaluated by her medical care team prior to that date to receive clearance to return to work.   Sincerely,  Cherene Altes, MD [facility] Office  [phone]
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
## 2020-03-28 NOTE — Progress Notes (Addendum)
Neurology Progress Note   S:// Patient seen and examined on the floor. Was sleeping, says she sleeps well with the Ambien that was given to her. Reports significant improvement in sensation and strength in both the legs.   O:// Current vital signs: BP 124/78 (BP Location: Right Arm)   Pulse 78   Temp 98.3 F (36.8 C) (Oral)   Resp 12   Ht 5' (1.524 m)   Wt 67.4 kg   SpO2 99%   BMI 29.02 kg/m  Vital signs in last 24 hours: Temp:  [98.1 F (36.7 C)-98.6 F (37 C)] 98.3 F (36.8 C) (04/25 0400) Pulse Rate:  [72-88] 78 (04/25 0400) Resp:  [12-16] 12 (04/25 0400) BP: (106-124)/(69-84) 124/78 (04/25 0400) SpO2:  [96 %-100 %] 99 % (04/25 0400) Weight:  [67.4 kg] 67.4 kg (04/25 0400) Neurological exam Sleeping, wakes to voice, follows all commands. Speech is clear with no evidence of dysarthria or aphasia. Cranial nerves: Pupils equal round react light, extra movements intact, visual fields full, facial sensation intact, face is symmetric, auditory acuity intact, shoulder shrug intact, tongue and palate midline. Motor exam: Upper extremities 5/5 bilaterally.  Lower extremities are at least 3/5 with clear effort dependent weakness and Hoover sign positive bilaterally today. Sensory exam: Normal sensory level and is sensate to light touch and vibration on both lower extremities. Coordination: Intact finger-nose-finger testing in upper extremity.  Unable to reliably test on the lower extremity. Gait testing was deferred at this time   Medications  Current Facility-Administered Medications:  .  acetaminophen (TYLENOL) tablet 650 mg, 650 mg, Oral, Q6H PRN, 650 mg at 03/28/20 0238 **OR** acetaminophen (TYLENOL) suppository 650 mg, 650 mg, Rectal, Q6H PRN, Karmen Bongo, MD .  bisacodyl (DULCOLAX) EC tablet 5 mg, 5 mg, Oral, Daily PRN, Karmen Bongo, MD .  citalopram (CELEXA) tablet 20 mg, 20 mg, Oral, Daily, Karmen Bongo, MD, 20 mg at 03/27/20 1749 .  darifenacin (ENABLEX) 24  hr tablet 15 mg, 15 mg, Oral, Daily, Karmen Bongo, MD, Stopped at 03/27/20 1322 .  docusate sodium (COLACE) capsule 100 mg, 100 mg, Oral, BID, Karmen Bongo, MD, 100 mg at 03/27/20 2259 .  enoxaparin (LOVENOX) injection 40 mg, 40 mg, Subcutaneous, Q24H, Karmen Bongo, MD, 40 mg at 03/27/20 1749 .  hydrALAZINE (APRESOLINE) injection 5 mg, 5 mg, Intravenous, Q4H PRN, Karmen Bongo, MD .  lactated ringers infusion, , Intravenous, Continuous, Karmen Bongo, MD, Last Rate: 50 mL/hr at 03/27/20 1326, New Bag at 03/27/20 1326 .  norethindrone (MICRONOR) 0.35 MG tablet 0.35 mg, 1 tablet, Oral, Daily, Karmen Bongo, MD, Stopped at 03/27/20 1321 .  ondansetron (ZOFRAN) tablet 4 mg, 4 mg, Oral, Q6H PRN **OR** ondansetron (ZOFRAN) injection 4 mg, 4 mg, Intravenous, Q6H PRN, Karmen Bongo, MD .  polyethylene glycol (MIRALAX / GLYCOLAX) packet 17 g, 17 g, Oral, Daily PRN, Karmen Bongo, MD .  sodium chloride flush (NS) 0.9 % injection 3 mL, 3 mL, Intravenous, Q12H, Karmen Bongo, MD .  zolpidem Lorrin Mais) tablet 5 mg, 5 mg, Oral, QHS PRN, Karmen Bongo, MD, 5 mg at 03/28/20 0237 Labs CBC    Component Value Date/Time   WBC 5.6 03/28/2020 0520   RBC 4.03 03/28/2020 0520   HGB 12.7 03/28/2020 0520   HGB 12.4 03/27/2019 0828   HCT 37.0 03/28/2020 0520   HCT 37.2 03/27/2019 0828   PLT 220 03/28/2020 0520   PLT 215 03/27/2019 0828   MCV 91.8 03/28/2020 0520   MCV 95 03/27/2019 0828   MCH  31.5 03/28/2020 0520   MCHC 34.3 03/28/2020 0520   RDW 11.8 03/28/2020 0520   RDW 12.6 03/27/2019 0828   LYMPHSABS 2.5 03/26/2020 1429   LYMPHSABS 1.8 11/15/2018 1143   MONOABS 0.6 03/26/2020 1429   EOSABS 0.2 03/26/2020 1429   EOSABS 0.1 11/15/2018 1143   BASOSABS 0.0 03/26/2020 1429   BASOSABS 0.0 11/15/2018 1143    CMP     Component Value Date/Time   NA 140 03/28/2020 0520   NA 139 02/04/2019 1708   K 3.7 03/28/2020 0520   CL 108 03/28/2020 0520   CO2 25 03/28/2020 0520   GLUCOSE 92  03/28/2020 0520   BUN 8 03/28/2020 0520   BUN 6 02/04/2019 1708   CREATININE 0.59 03/28/2020 0520   CALCIUM 9.2 03/28/2020 0520   PROT 7.0 03/26/2020 1429   PROT 6.5 02/04/2019 1708   ALBUMIN 4.2 03/26/2020 1429   ALBUMIN 3.9 02/04/2019 1708   AST 17 03/26/2020 1429   ALT 12 03/26/2020 1429   ALKPHOS 51 03/26/2020 1429   BILITOT 0.3 03/26/2020 1429   BILITOT <0.2 02/04/2019 1708   GFRNONAA >60 03/28/2020 0520   GFRAA >60 03/28/2020 0520   CSF sampling revealed a red sample with normal protein.  14063 red cells, 17 white cells.   Imaging I have reviewed images in epic and the results pertinent to this consultation are: MRI of the brain, C-spine, T-spine and L-spine with and without contrast are unremarkable.   Assessment: 29 year old woman with a history of anxiety, bipolar disorder, chronic back pain, depression, history of migraines, multiple allergies, PTSD, presenting with a multitude of complaints-slow thought process, difficulty thinking, inability to multitask and sudden onset of difficulty walking and unable to bear weight on her legs. Initial examination revealed complete flaccid paralysis of lower extremities and a sharp cutoff of sensation above the umbilicus with repeat exam with some signs of ascending weakness/diminished sensation. Although the examination appeared inconsistent physiologically, the whole neuraxis was imaged and CSF was sampled with no apparent cause to explain her findings. Upon admission overnight and receiving fluids and supportive treatment, there is significant improvement in the weakness from 0/5 to at least 3/5 in both lower extremities with marked effort dependent weakness. Likely psychogenic weakness of both lower extremities. CSF sampling showed no evidence of albuminocytological dissociation.   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  Recommendations: Outpatient follow-up with neurology as needed Outpatient psychiatry follow-up PT OT Discussed my plan with her parents over the phone with her permission.  Her father is a retired Software engineer and her mother is an Therapist, sports in the hospital.  I have answered all their questions.  Inpatient neurology will be available as needed.  Please call with questions. -- Amie Portland, MD [facility] Pager: [phone] If 7pm to 7am, please call on call as listed on AMION.

## 2020-03-28 NOTE — Discharge Summary (Signed)
DISCHARGE SUMMARY  Whitney Gonzalez  MR#: [id number]  DOB:1991-07-31  Date of Admission: 03/26/2020 Date of Discharge: 03/28/2020  Attending Physician:Jkai Arwood Hennie Duos, MD  Patient's NH:[id number], [name], MD  Consults: Neurology  Disposition: D/C  Home   Follow-up Appts: Follow-up Information    Schedule an appointment as soon as possible for a visit  with Marcial Pacas, MD.   Specialty: Neurology Contact information: [place] [address] [phone]        London Pepper, MD Follow up in 3 day(s).   Specialty: Family Medicine Contact information: [place] [address] [place] [address] [phone]        [facility]. Schedule an appointment as soon as possible for a visit.   Specialty: Rehabilitation Why: Call for appintment on Monday Contact information: [address] [place], [place] [address] [id number] [place] [phone]          Discharge Diagnoses: Multiple neurological complaints with a nonphysiological exam Psychogenic weakness/gait disturbance Conversion disorder Bipolar disorder Depression History of migraines PTSD [name]  Initial presentation: 29 year old woman with a history of anxiety, bipolar disorder, chronic back pain, depression, history of migraines, multiple allergies, and PTSD who presented with a multitude of complaints-slow thought process, difficulty thinking, inability to multitask and sudden onset of difficulty walking and unable to bear weight on her legs.  Hospital Course: As per Dr. Rory Percy (Neurology): Initial examination revealed complete flaccid paralysis of lower extremities and a sharp cutoff of sensation above the umbilicus with repeat exam with some signs of ascending weakness/diminished sensation. Although the examination appeared inconsistent physiologically, the whole neuraxis was imaged and CSF was sampled with  no apparent cause to explain her findings. Upon admission overnight and receiving fluids and supportive treatment, there is significant improvement in the weakness from 0/5 to at least 3/5 in both lower extremities with marked effort dependent weakness. Likely psychogenic weakness of both lower extremities. CSF sampling showed no evidence of albuminocytological dissociation.  Neuro evaluation resulted in the following Impression: -Multiple neurological complaints with a nonphysiological exam -Likely psychogenic weakness/gait disturbance-conversion disorder.  And the following Recommendations: Outpatient follow-up with neurology as needed Outpatient psychiatry follow-up PT OT  No other acute medical issues were encountered during this hospital stay.  All of the patient's active complaints were evaluated and addressed by the neurologist consultant.   Allergies as of 03/28/2020      Reactions   Azithromycin Other (See Comments)   Steven-Johnsons   Banana Anaphylaxis   Cetirizine Other (See Comments), Nausea Only   Flushing H/A   Doxycycline Hyclate Shortness Of Breath, Nausea Only   Latex Anaphylaxis   Other reaction(s): hives   Mango Flavor Anaphylaxis   Other Anaphylaxis, Rash, Other (See Comments), Swelling   Banana, mango, avocado     Propranolol Hcl Shortness Of Breath   Other reaction(s): asthma symptoms worsened   Shellfish Allergy Anaphylaxis   Other reaction(s): throat swellinf Other reaction(s): throat swellinf   Allegra [fexofenadine] Hives   Estrogens Other (See Comments)   Estrogen related BCP- Migraines   Metronidazole Hives   Sprintec 28 [norgestimate-eth Estradiol] Nausea And Vomiting   Tegretol [carbamazepine] Other (See Comments)   Other reaction(s): steven's -johnson syndrome Kathreen Cosier   Amoxicillin-pot Clavulanate Nausea And Vomiting, Rash   Other reaction(s): rash, mood destabilization   Cephalexin Rash   Codeine Palpitations   Heart rate goes over  200bpm   Penicillins Rash   Has patient had a PCN  reaction causing immediate rash, facial/tongue/throat swelling, SOB or lightheadedness with hypotension: unknown Has patient had a PCN reaction causing severe rash involving mucus membranes or skin necrosis: unknown Has patient had a PCN reaction that required hospitalization unknown Has patient had a PCN reaction occurring within the last 10 years: unknown If all of the above answers are "NO", then may proceed with Cephalosporin use.   Sulfamethoxazole-trimethoprim Rash      Medication List    TAKE these medications   amLODipine 5 MG tablet Commonly known as: NORVASC Take 5 mg by mouth daily. Notes to patient: Lowers blood pressure  Decreases chest pain    citalopram 20 MG tablet Commonly known as: CELEXA Take 1 tablet (20 mg total) by mouth daily. Notes to patient: Depression    EpiPen 2-Pak 0.3 mg/0.3 mL Soaj injection Generic drug: EPINEPHrine Inject 0.3 mg into the muscle as needed for anaphylaxis.   Maxalt 10 MG tablet Generic drug: rizatriptan Take 10 mg by mouth as needed (migraine).   norethindrone 0.35 MG tablet Commonly known as: MICRONOR Take 1 tablet (0.35 mg total) by mouth daily. Notes to patient: Birth control  Endometriosis    solifenacin 10 MG tablet Commonly known as: VESICARE Take 10 mg by mouth daily. Notes to patient: Overactive bladder       Day of Discharge BP 113/70 (BP Location: Right Arm)   Pulse 61   Temp 98.1 F (36.7 C) (Oral)   Resp 18   Ht 5' (1.524 m)   Wt 67.4 kg   SpO2 100%   BMI 29.02 kg/m   Physical Exam: As per Neurology consultation note. Exam not indicated or performed by [facility].   Basic Metabolic Panel: Recent Labs  Lab 03/26/20 1429 03/28/20 0520  NA 138 140  K 4.1 3.7  CL 106 108  CO2 26 25  GLUCOSE 111* 92  BUN 8 8  CREATININE 0.50 0.59  CALCIUM 9.5 9.2    Liver Function Tests: Recent Labs  Lab 03/26/20 1429  AST 17  ALT 12  ALKPHOS 51  BILITOT  0.3  PROT 7.0  ALBUMIN 4.2   CBC: Recent Labs  Lab 03/26/20 1429 03/28/20 0520  WBC 6.7 5.6  NEUTROABS 3.4  --   HGB 14.2 12.7  HCT 42.0 37.0  MCV 92.5 91.8  PLT 262 220    Recent Results (from the past 240 hour(s))  SARS CORONAVIRUS 2 (TAT 6-24 HRS) Nasopharyngeal Nasopharyngeal Swab     Status: None   Collection Time: 03/27/20 11:07 AM   Specimen: Nasopharyngeal Swab  Result Value Ref Range Status   SARS Coronavirus 2 NEGATIVE NEGATIVE Final    Comment: (NOTE) SARS-CoV-2 target nucleic acids are NOT DETECTED. The SARS-CoV-2 RNA is generally detectable in upper and lower respiratory specimens during the acute phase of infection. Negative results do not preclude SARS-CoV-2 infection, do not rule out co-infections with other pathogens, and should not be used as the sole basis for treatment or other patient management decisions. Negative results must be combined with clinical observations, patient history, and epidemiological information. The expected result is Negative. Fact Sheet for Patients: SugarRoll.be Fact Sheet for Healthcare Providers: https://www.woods-mathews.com/ This test is not yet approved or cleared by the [place] FDA and  has been authorized for detection and/or diagnosis of SARS-CoV-2 by FDA under an Emergency Use Authorization (EUA). This EUA will remain  in effect (meaning this test can be used) for the duration of the COVID-19 declaration under Section 56 4(b)(1) of the  Act, 21 U.S.C. section 360bbb-3(b)(1), unless the authorization is terminated or revoked sooner. Performed at [facility], [place] [address] [place], [place] [address]   CSF culture     Status: None (Preliminary result)   Collection Time: 03/27/20 12:44 PM   Specimen: PATH Cytology CSF; Cerebrospinal Fluid  Result Value Ref Range Status   Specimen Description CSF  Final   Special Requests NONE  Final   Gram Stain   Final    WBC  PRESENT,BOTH PMN AND MONONUCLEAR NO ORGANISMS SEEN CYTOSPIN SMEAR    Culture   Final    NO GROWTH < 24 HOURS Performed at [facility], [place] [address], [place], [place] [address]    Report Status PENDING  Incomplete  Culture, fungus without smear     Status: None (Preliminary result)   Collection Time: 03/27/20 12:44 PM   Specimen: PATH Cytology CSF; Cerebrospinal Fluid  Result Value Ref Range Status   Specimen Description CYTO CSF  Final   Special Requests NONE  Final   Culture   Final    NO GROWTH < 24 HOURS Performed at [facility], [place] [address], [place], [place] [address]    Report Status PENDING  Incomplete     03/28/2020, 6:44 PM   Cherene Altes, MD [facility] Office  [phone]

## 2020-03-28 NOTE — ED Notes (Signed)
Report called and given to Joanna.

## 2020-03-28 NOTE — Discharge Instructions (Signed)

## 2020-03-29 ENCOUNTER — Telehealth (HOSPITAL_COMMUNITY): Payer: Self-pay

## 2020-03-29 ENCOUNTER — Encounter: Payer: Self-pay | Admitting: Physical Therapy

## 2020-03-29 ENCOUNTER — Other Ambulatory Visit: Payer: Self-pay

## 2020-03-29 ENCOUNTER — Ambulatory Visit: Payer: Medicaid Other | Admitting: Physical Therapy

## 2020-03-29 DIAGNOSIS — M6281 Muscle weakness (generalized): Secondary | ICD-10-CM

## 2020-03-29 DIAGNOSIS — R252 Cramp and spasm: Secondary | ICD-10-CM

## 2020-03-29 LAB — VDRL, CSF: VDRL Quant, CSF: NONREACTIVE

## 2020-03-29 NOTE — Telephone Encounter (Signed)
Patient called and stated that she was at [facility] over the weekend. Couldn't move from waist down. She stated they did MRI's and a spinal tap and they came back normal. She stated she couldn't feel or move Her neurologist, Dr. Malen Gauze, stated that his evaluation is that it was a stress response. She stated it came and went quickly. By late Saturday she stated she was able to wiggle her toes and she's walking with a walker. She makes sure people help her up and down stairs. She stated it all started Friday morning. They want her to do physical therapy for walking and continue pt for pelvic issues. She's also following up with her Neurologist and PCP.
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
## 2020-03-29 NOTE — Telephone Encounter (Signed)
Pt requesting a call to discuss what was said and done at the hospital, Please advise

## 2020-03-29 NOTE — Patient Instructions (Signed)
Stress Management and Relaxation Techniques We have two major categories in our nervous system.  The "fight or flight" nervous system (called the sympathetic nervous system), and the "calming, rest and digest" nervous system (called the parasympathetic nervous system).  These two systems have opposite effects on our body organs and systems and can impact our heart rate, blood pressure, breath rate, temperature, GI function, and experience of stress or calm.   Taking time to stimulate the "calming" parasympathetic system can help reduce experience of symptoms of chronic pain conditions, decrease stress and anxiety, and allow [place] to feel more equipped to handle challenges.  Below are strategies, techniques, and video suggestions to help stimulate this calming system.     Ways to Calm the Nervous System Yoga Meditation Mindfulness  Stretching Exercise Deep, slow breathing into belly (diaphragmatic breathing) with focused prolonged exhale Monotasking vs Multi-tasking (do one activity daily that is simple, focused, and slowly performed) Listen to your biorhythms: sleep when tired, rise when rested, eat when hungry, stop when full, etc Social connections - seek connections with others Laughter - laughing helps stimulate our "calming" nervous system Massage - by a practitioner or self-massage (example, feet for reflexology points) Singing or humming Cold exposure - try 30 sec of cold water at the end of your shower  Meditation, Yoga, Breathing, Stretching Video Suggestions [employer] YouTube Videos: Dwain Sarna Meditation for Pelvic Floor Relaxation - [employer] YouTube video . 10-Min Breath Meditation for Pelvic Health and Healing - [employer] YouTube video . Pelvic Floor Release Stretches . Bedtime Yoga for Pelvic Tension . Pelvic Floor Release and Inner Thigh Stretch - Yoga for Pelvic Health (approx. 40 min) Progressive Muscle Relaxation Exercises - search Virgel Bouquet  exercise videos on YouTube . Focused relaxation through guided relaxation, breathing, and contractions/relaxations of various muscle groups Autogenic Relaxation Technique - search videos on YouTube . Uses body's natural relaxation response through guided meditation, inducing heaviness in body, and verbal stimuli/affirmations to create overall feeling of well-being, slowed breathing, reduced heart rate, reduced blood pressure, reduced stress/anxiety Sympathetic Breathing Meditation - search videos on YouTube . Regulate the nervous system and restore calm through focused breathing to stimulate the parasympathetic nervous system (the opposite of our "fight or flight" sympathetic nervous system) Mindfulness Meditation - search videos on YouTube . Focuses on choosing to be present in the moment, finding enjoyment in the now Diaphragmatic Breathing - search videos on YouTube Guided Imagery for Relaxation - search videos on [facility] [address], [place], [place] [address] Phone # [phone] Fax [phone]

## 2020-03-29 NOTE — Telephone Encounter (Signed)
I reviewed notes. Agree conversion reaction is a possibility. Recommend to follow up with psychiatry / psychology for now; may follow up in our clinic after 1-2 months. -VRP

## 2020-03-29 NOTE — Telephone Encounter (Signed)
Hospital notes reviewed. -VRP

## 2020-03-30 LAB — CSF CULTURE W GRAM STAIN: Culture: NO GROWTH

## 2020-03-30 LAB — PATHOLOGIST SMEAR REVIEW

## 2020-03-30 NOTE — Telephone Encounter (Signed)
Called patient and advised her of Dr Gladstone Lighter recommendations. She stated she will call her psychiatrist today to discuss recent hospitalization. We scheduled for 8 week FU, and she verbalized understanding, appreciation.

## 2020-03-30 NOTE — Telephone Encounter (Signed)
LVM requesting call back.

## 2020-03-30 NOTE — Telephone Encounter (Signed)
Pt returned call. Please call back when available. 

## 2020-03-31 ENCOUNTER — Other Ambulatory Visit (HOSPITAL_COMMUNITY): Payer: Self-pay | Admitting: *Deleted

## 2020-03-31 ENCOUNTER — Other Ambulatory Visit: Payer: Self-pay

## 2020-03-31 ENCOUNTER — Telehealth (INDEPENDENT_AMBULATORY_CARE_PROVIDER_SITE_OTHER): Payer: Medicaid Other | Admitting: Psychiatry

## 2020-03-31 DIAGNOSIS — F411 Generalized anxiety disorder: Secondary | ICD-10-CM | POA: Diagnosis not present

## 2020-03-31 DIAGNOSIS — F449 Dissociative and conversion disorder, unspecified: Secondary | ICD-10-CM | POA: Diagnosis not present

## 2020-03-31 DIAGNOSIS — F4310 Post-traumatic stress disorder, unspecified: Secondary | ICD-10-CM | POA: Diagnosis not present

## 2020-03-31 LAB — OLIGOCLONAL BANDS, CSF + SERM

## 2020-03-31 MED ORDER — CITALOPRAM HYDROBROMIDE 20 MG PO TABS: 30.0000 mg | ORAL_TABLET | Freq: Every day | ORAL | 1 refills | Status: DC

## 2020-03-31 MED ORDER — LORAZEPAM 0.5 MG PO TABS: 0.5000 mg | ORAL_TABLET | ORAL | 0 refills | Status: AC | PRN

## 2020-03-31 MED ORDER — LORAZEPAM 0.5 MG PO TABS: 0.5000 mg | ORAL_TABLET | ORAL | 0 refills | Status: DC | PRN
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
## 2020-04-01 LAB — ANAEROBIC CULTURE

## 2020-04-05 ENCOUNTER — Other Ambulatory Visit: Payer: Self-pay

## 2020-04-05 ENCOUNTER — Ambulatory Visit: Payer: Medicaid Other | Attending: Internal Medicine | Admitting: Physical Therapy

## 2020-04-05 DIAGNOSIS — R252 Cramp and spasm: Secondary | ICD-10-CM | POA: Diagnosis present

## 2020-04-05 DIAGNOSIS — R262 Difficulty in walking, not elsewhere classified: Secondary | ICD-10-CM | POA: Diagnosis present

## 2020-04-05 DIAGNOSIS — M6281 Muscle weakness (generalized): Secondary | ICD-10-CM | POA: Diagnosis not present
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
## 2020-04-06 ENCOUNTER — Ambulatory Visit (INDEPENDENT_AMBULATORY_CARE_PROVIDER_SITE_OTHER): Payer: Medicaid Other | Admitting: Licensed Clinical Social Worker

## 2020-04-06 ENCOUNTER — Other Ambulatory Visit: Payer: Self-pay

## 2020-04-06 ENCOUNTER — Encounter: Payer: Self-pay | Admitting: Physical Therapy

## 2020-04-06 DIAGNOSIS — F411 Generalized anxiety disorder: Secondary | ICD-10-CM

## 2020-04-06 DIAGNOSIS — F319 Bipolar disorder, unspecified: Secondary | ICD-10-CM | POA: Diagnosis not present

## 2020-04-06 DIAGNOSIS — F4310 Post-traumatic stress disorder, unspecified: Secondary | ICD-10-CM
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
## 2020-04-06 NOTE — Addendum Note (Signed)
Addended by: Su Hoff on: 04/06/2020 10:06 AM   Modules accepted: Orders

## 2020-04-07 ENCOUNTER — Ambulatory Visit (HOSPITAL_COMMUNITY)
Admission: RE | Admit: 2020-04-07 | Discharge: 2020-04-07 | Disposition: A | Discharge status: Home / Self Care | Payer: Medicaid Other | Source: Ambulatory Visit | Attending: Family Medicine | Admitting: Family Medicine

## 2020-04-07 ENCOUNTER — Other Ambulatory Visit: Payer: Self-pay

## 2020-04-07 DIAGNOSIS — N83201 Unspecified ovarian cyst, right side: Secondary | ICD-10-CM | POA: Diagnosis not present

## 2020-04-09 NOTE — Telephone Encounter (Signed)
I relayed message to therapist

## 2020-04-14 ENCOUNTER — Telehealth (HOSPITAL_COMMUNITY): Payer: Self-pay

## 2020-04-14 NOTE — Telephone Encounter (Signed)
Patient called and stated that she needs a letter for work. She thinks they've been talking about firing her because she's been out since April when she got out of the hospital. She wants to go back to work but stated that because of her Conversion Disorder she would need to work every other day and have Saturdays off as opposed to every day due to the Conversion Disorder that causes physical and mental exhaustion. Is it ok to do the letter? Please review and advise. Thank you.

## 2020-04-14 NOTE — Telephone Encounter (Signed)
Yes she can have a letter stating her diagnosis and change of schedule related to her underlying psychiatric illness.

## 2020-04-15 ENCOUNTER — Encounter: Payer: Medicaid Other | Admitting: Physical Therapy

## 2020-04-16 ENCOUNTER — Encounter (HOSPITAL_COMMUNITY): Payer: Self-pay

## 2020-04-17 LAB — CULTURE, FUNGUS WITHOUT SMEAR

## 2020-04-19 ENCOUNTER — Telehealth: Payer: Self-pay | Admitting: Obstetrics and Gynecology

## 2020-04-19 ENCOUNTER — Other Ambulatory Visit: Payer: Medicaid Other

## 2020-04-19 NOTE — Telephone Encounter (Signed)
Patient having a lot of pain from her cyst, state she wiped this morning and she saw light pink,

## 2020-04-19 NOTE — Telephone Encounter (Signed)
Called pt and discussed her concern. She stated that her LMP was weekend of 5/7. She has been having intermittent cramping and spotting. Per chart review, pt had [place] on 5/5 which showed a mature follicle cyst of the Rt ovary. She has follow up office appt on 6/2 to discuss management. I advised pt that cramping is normal. She may take any of the following for pain: Ibuprofen, Naproxyn or Tylenol. Pt states she usually has better relief with Midol and I stated this is also ok.  Pt was advised to notify us if her bleeding becomes very heavy or if the pain cannot be managed with OTC medications. She voiced understanding.

## 2020-04-20 ENCOUNTER — Ambulatory Visit (HOSPITAL_COMMUNITY): Payer: Medicaid Other | Admitting: Licensed Clinical Social Worker

## 2020-04-21 ENCOUNTER — Other Ambulatory Visit: Payer: Self-pay

## 2020-04-21 ENCOUNTER — Ambulatory Visit: Payer: Medicaid Other | Admitting: Physical Therapy

## 2020-04-21 ENCOUNTER — Encounter: Payer: Self-pay | Admitting: Physical Therapy

## 2020-04-21 DIAGNOSIS — M6281 Muscle weakness (generalized): Secondary | ICD-10-CM

## 2020-04-21 DIAGNOSIS — R262 Difficulty in walking, not elsewhere classified: Secondary | ICD-10-CM

## 2020-04-21 DIAGNOSIS — R252 Cramp and spasm: Secondary | ICD-10-CM
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
## 2020-04-22 ENCOUNTER — Ambulatory Visit: Payer: Medicaid Other | Admitting: Physical Therapy

## 2020-04-26 ENCOUNTER — Other Ambulatory Visit: Payer: Self-pay

## 2020-04-26 ENCOUNTER — Encounter: Payer: Self-pay | Admitting: Physical Therapy

## 2020-04-26 ENCOUNTER — Ambulatory Visit: Payer: Medicaid Other | Admitting: Physical Therapy

## 2020-04-26 DIAGNOSIS — M6281 Muscle weakness (generalized): Secondary | ICD-10-CM | POA: Diagnosis not present

## 2020-04-26 DIAGNOSIS — R252 Cramp and spasm: Secondary | ICD-10-CM

## 2020-04-26 DIAGNOSIS — R262 Difficulty in walking, not elsewhere classified: Secondary | ICD-10-CM
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
## 2020-04-27 ENCOUNTER — Encounter (HOSPITAL_COMMUNITY): Payer: Self-pay

## 2020-04-27 ENCOUNTER — Telehealth (HOSPITAL_COMMUNITY): Payer: Medicaid Other | Admitting: Psychiatry

## 2020-04-28 ENCOUNTER — Ambulatory Visit: Payer: Medicaid Other | Admitting: Diagnostic Neuroimaging

## 2020-04-29 ENCOUNTER — Encounter: Payer: Medicaid Other | Admitting: Physical Therapy

## 2020-04-30 ENCOUNTER — Other Ambulatory Visit: Payer: Self-pay

## 2020-04-30 ENCOUNTER — Telehealth (INDEPENDENT_AMBULATORY_CARE_PROVIDER_SITE_OTHER): Payer: Medicaid Other | Admitting: Psychiatry

## 2020-04-30 DIAGNOSIS — F449 Dissociative and conversion disorder, unspecified: Secondary | ICD-10-CM

## 2020-04-30 DIAGNOSIS — F4310 Post-traumatic stress disorder, unspecified: Secondary | ICD-10-CM

## 2020-04-30 DIAGNOSIS — F411 Generalized anxiety disorder: Secondary | ICD-10-CM

## 2020-04-30 MED ORDER — CITALOPRAM HYDROBROMIDE 20 MG PO TABS: 30.0000 mg | ORAL_TABLET | Freq: Every day | ORAL | 1 refills | Status: DC
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
## 2020-05-05 ENCOUNTER — Encounter: Payer: Self-pay | Admitting: Obstetrics & Gynecology

## 2020-05-05 ENCOUNTER — Other Ambulatory Visit: Payer: Self-pay

## 2020-05-05 ENCOUNTER — Ambulatory Visit (INDEPENDENT_AMBULATORY_CARE_PROVIDER_SITE_OTHER): Payer: Medicaid Other | Admitting: Obstetrics & Gynecology

## 2020-05-05 VITALS — BP 108/70 | HR 66 | Wt 143.0 lb

## 2020-05-05 DIAGNOSIS — N83201 Unspecified ovarian cyst, right side: Secondary | ICD-10-CM

## 2020-05-05 DIAGNOSIS — N970 Female infertility associated with anovulation: Secondary | ICD-10-CM | POA: Diagnosis not present
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
## 2020-05-06 ENCOUNTER — Encounter: Payer: Medicaid Other | Admitting: Physical Therapy

## 2020-05-07 ENCOUNTER — Ambulatory Visit: Payer: Medicaid Other | Admitting: Physical Therapy

## 2020-05-10 ENCOUNTER — Ambulatory Visit (INDEPENDENT_AMBULATORY_CARE_PROVIDER_SITE_OTHER): Payer: Medicaid Other | Admitting: Licensed Clinical Social Worker

## 2020-05-10 ENCOUNTER — Other Ambulatory Visit: Payer: Self-pay

## 2020-05-10 DIAGNOSIS — F319 Bipolar disorder, unspecified: Secondary | ICD-10-CM

## 2020-05-10 DIAGNOSIS — F411 Generalized anxiety disorder: Secondary | ICD-10-CM | POA: Diagnosis not present

## 2020-05-10 DIAGNOSIS — F4310 Post-traumatic stress disorder, unspecified: Secondary | ICD-10-CM

## 2020-05-10 LAB — TESTOSTERONE, FREE, TOTAL, SHBG
Sex Hormone Binding: 44.3 nmol/L (ref 24.6–122.0)
Testosterone, Free: 2.9 pg/mL (ref 0.0–4.2)
Testosterone: 23 ng/dL (ref 13–71)

## 2020-05-10 LAB — ANTI MULLERIAN HORMONE: ANTI-MULLERIAN HORMONE (AMH): 5.03 ng/mL

## 2020-05-10 LAB — PROGESTERONE: Progesterone: 0.1 ng/mL

## 2020-05-10 LAB — DHEA: Dehydroepiandrosterone: 253 ng/dL (ref 31–701)

## 2020-05-10 LAB — INSULIN, RANDOM: INSULIN: 15.1 u[IU]/mL (ref 2.6–24.9)

## 2020-05-10 LAB — PROLACTIN: Prolactin: 5 ng/mL (ref 4.8–23.3)
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
## 2020-05-14 ENCOUNTER — Ambulatory Visit: Payer: Medicaid Other | Attending: Internal Medicine | Admitting: Physical Therapy

## 2020-05-14 ENCOUNTER — Encounter: Payer: Self-pay | Admitting: Physical Therapy

## 2020-05-14 ENCOUNTER — Other Ambulatory Visit: Payer: Self-pay

## 2020-05-14 DIAGNOSIS — M6281 Muscle weakness (generalized): Secondary | ICD-10-CM | POA: Diagnosis present

## 2020-05-14 DIAGNOSIS — R262 Difficulty in walking, not elsewhere classified: Secondary | ICD-10-CM | POA: Insufficient documentation

## 2020-05-14 DIAGNOSIS — R252 Cramp and spasm: Secondary | ICD-10-CM | POA: Insufficient documentation
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
## 2020-05-17 ENCOUNTER — Ambulatory Visit: Payer: Medicaid Other | Admitting: Diagnostic Neuroimaging

## 2020-05-19 ENCOUNTER — Ambulatory Visit: Payer: Medicaid Other | Admitting: Physical Therapy

## 2020-05-24 ENCOUNTER — Other Ambulatory Visit: Payer: Self-pay

## 2020-05-24 ENCOUNTER — Ambulatory Visit: Payer: Medicaid Other | Admitting: Physical Therapy

## 2020-05-24 ENCOUNTER — Ambulatory Visit (INDEPENDENT_AMBULATORY_CARE_PROVIDER_SITE_OTHER): Payer: Medicaid Other | Admitting: Obstetrics & Gynecology

## 2020-05-24 ENCOUNTER — Encounter: Payer: Self-pay | Admitting: Obstetrics & Gynecology

## 2020-05-24 VITALS — BP 113/69 | HR 67 | Temp 98.5°F | Wt 144.0 lb

## 2020-05-24 DIAGNOSIS — N83201 Unspecified ovarian cyst, right side: Secondary | ICD-10-CM

## 2020-05-24 MED ORDER — DROSPIRENONE 4 MG PO TABS: 1.0000 | ORAL_TABLET | Freq: Every day | ORAL | 4 refills | Status: DC

## 2020-05-24 MED ORDER — METOCLOPRAMIDE HCL 5 MG PO TABS
5.0000 mg | ORAL_TABLET | Freq: Three times a day (TID) | ORAL | 1 refills | Status: DC
Start: 2020-05-24 — End: 2021-05-31
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
## 2020-05-24 NOTE — Patient Instructions (Signed)

## 2020-05-28 ENCOUNTER — Encounter (HOSPITAL_COMMUNITY): Payer: Self-pay

## 2020-05-31 ENCOUNTER — Encounter: Payer: Self-pay | Admitting: Diagnostic Neuroimaging

## 2020-05-31 ENCOUNTER — Ambulatory Visit: Payer: Medicaid Other | Admitting: Diagnostic Neuroimaging

## 2020-05-31 ENCOUNTER — Ambulatory Visit (HOSPITAL_COMMUNITY): Payer: Medicaid Other | Admitting: Psychiatry

## 2020-05-31 ENCOUNTER — Telehealth (HOSPITAL_COMMUNITY): Payer: Medicaid Other | Admitting: Psychiatry

## 2020-05-31 VITALS — BP 110/78 | HR 77 | Ht 60.75 in | Wt 143.6 lb

## 2020-05-31 DIAGNOSIS — R200 Anesthesia of skin: Secondary | ICD-10-CM

## 2020-05-31 DIAGNOSIS — R413 Other amnesia: Secondary | ICD-10-CM | POA: Diagnosis not present

## 2020-05-31 DIAGNOSIS — G43109 Migraine with aura, not intractable, without status migrainosus: Secondary | ICD-10-CM
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
## 2020-06-01 ENCOUNTER — Encounter: Payer: Self-pay | Admitting: Diagnostic Neuroimaging

## 2020-06-02 ENCOUNTER — Ambulatory Visit (INDEPENDENT_AMBULATORY_CARE_PROVIDER_SITE_OTHER): Payer: Medicaid Other | Admitting: Licensed Clinical Social Worker

## 2020-06-02 ENCOUNTER — Other Ambulatory Visit: Payer: Self-pay

## 2020-06-02 ENCOUNTER — Telehealth (INDEPENDENT_AMBULATORY_CARE_PROVIDER_SITE_OTHER): Payer: Medicaid Other | Admitting: Psychiatry

## 2020-06-02 ENCOUNTER — Encounter (HOSPITAL_COMMUNITY): Payer: Self-pay | Admitting: Psychiatry

## 2020-06-02 DIAGNOSIS — F411 Generalized anxiety disorder: Secondary | ICD-10-CM

## 2020-06-02 DIAGNOSIS — F4310 Post-traumatic stress disorder, unspecified: Secondary | ICD-10-CM

## 2020-06-02 DIAGNOSIS — F319 Bipolar disorder, unspecified: Secondary | ICD-10-CM

## 2020-06-02 MED ORDER — CITALOPRAM HYDROBROMIDE 20 MG PO TABS: 30.0000 mg | ORAL_TABLET | Freq: Every day | ORAL | 2 refills | Status: DC
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
## 2020-06-02 NOTE — Progress Notes (Signed)
Virtual Visit via Telephone Note   I connected with Whitney Gonzalez on 06/02/20 at 3:00pm by telephone and verified that I am speaking with the correct person using two identifiers.   I discussed the limitations, risks, security and privacy concerns of performing an evaluation and management service by telephone and the availability of in person appointments. I also discussed with the patient that there may be a patient responsible charge related to this service. The patient expressed understanding and agreed to proceed.   I discussed the assessment and treatment plan with the patient. The patient was provided an opportunity to ask questions and all were answered. The patient agreed with the plan and demonstrated an understanding of the instructions.   The patient was advised to call back or seek an in-person evaluation if the symptoms worsen or if the condition fails to improve as anticipated.   I provided 45 minutes of non-face-to-face time during this encounter.     Shade Flood, LCSW, LCAS _____________________________ THERAPIST PROGRESS NOTE   Session Time: 3:00pm - 3:45pm  Location: Patient: Patient Home Provider: [facility] [place] Office     Participation Level: Active    Behavioral Response: Alert, depressed mood   Type of Therapy:  Individual Therapy   Treatment Goals addressed: Medication compliance; Stress management; Resolving Interpersonal Issues in Relationship; Employment    Interventions: CBT, assertive communication skills and healthy boundaries; Solution focused/strength based   Summary: Whitney Gonzalez is a 29 year old Caucasian female that prefers to go by "[name]" and presented for therapy session today via telephone with diagnoses of Generalized Anxiety Disorder, PTSD, and Bipolar I disorder.      Suicidal/Homicidal: None, without plan or intent.     Therapist Response: Clinician spoke with Whitney Gonzalez today via telephone today, as she reported that she continues  to have trouble with virtual sessions due to unreliable internet, and needed to care for her son, so visiting in person was not possible.  Clinician assessed for safety and medication compliance.  [name] spoke in a manner that was alert, oriented x5, with no evidence or self-report of SI/HI or A/V H.  [name] reported ongoing compliance with medication and denied alcohol or illicit substance use.  Clinician inquired about [name]'s current emotional ratings, as well as any significant changes in thoughts, feelings, or behavior since last check-in. [name] reported scores of 3/10 for depression and 1/10 for anxiety today, stating "I guess I've been a little bit more down lately, but I can't remember the last time I had any panic attacks at least".  Whitney Gonzalez also denied any reoccurrence of paralysis since hospitalization.  Clinician inquired about progress Whitney Gonzalez has made towards treatment goals recently, as well as present challenges.  [name] reported that she and her boyfriend's relationship has improved since she took a more assertive approach to interpersonal issues, and he is even attending therapy now too, so she is optimistic this will strengthen their communication and connectedness.  [name] reported that her job has become an issue, as the schedule has led to an imbalance in her life, and this has affected her mood.  Clinician worked with Whitney Gonzalez to weigh the pros and cons of this employment, as well as explore solutions to address current issues, including speaking with the supervisor about changes the might be made to schedule, or looking into alternative careers which might offer more satisfaction, higher pay, and greater flexibility.  [name] reported that currently the cons outweigh the pros, and she has already spoke with her supervisor,  who wouldn't accommodate.  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  Clinician discussed strategies for aiding in this transition, including revisiting resume, cover letter, and beginning related weekly job search.  Interventions were effective, as evidenced by Whitney Gonzalez reporting that she did not feel as sad upon engaging in this discussion, had more hope for the future, and a more tangible plan for transitioning out of her current job to reduce stress.  [name] reported that she would need to locate previous resume this month to make updates, and will aim to submit 1-3 job applications weekly in positions which she feels highlight her strengths, in companies that share similar values to her own.  Clinician will continue to monitor.   Plan: Follow up again virtually in 2 weeks.   Diagnosis: Generalized Anxiety Disorder, PTSD, and Bipolar I disorder   Shade Flood, LCSW, LCAS 06/02/20

## 2020-06-15 ENCOUNTER — Encounter (HOSPITAL_COMMUNITY): Payer: Self-pay

## 2020-06-16 ENCOUNTER — Other Ambulatory Visit: Payer: Self-pay

## 2020-06-16 ENCOUNTER — Encounter: Payer: Self-pay | Admitting: Physical Therapy

## 2020-06-16 ENCOUNTER — Ambulatory Visit (INDEPENDENT_AMBULATORY_CARE_PROVIDER_SITE_OTHER): Payer: Medicaid Other | Admitting: Licensed Clinical Social Worker

## 2020-06-16 ENCOUNTER — Ambulatory Visit: Payer: Medicaid Other | Attending: Internal Medicine | Admitting: Physical Therapy

## 2020-06-16 DIAGNOSIS — M6281 Muscle weakness (generalized): Secondary | ICD-10-CM | POA: Insufficient documentation

## 2020-06-16 DIAGNOSIS — R262 Difficulty in walking, not elsewhere classified: Secondary | ICD-10-CM | POA: Insufficient documentation

## 2020-06-16 DIAGNOSIS — R252 Cramp and spasm: Secondary | ICD-10-CM

## 2020-06-16 DIAGNOSIS — F4310 Post-traumatic stress disorder, unspecified: Secondary | ICD-10-CM

## 2020-06-16 DIAGNOSIS — F411 Generalized anxiety disorder: Secondary | ICD-10-CM | POA: Diagnosis not present

## 2020-06-16 DIAGNOSIS — F319 Bipolar disorder, unspecified: Secondary | ICD-10-CM | POA: Diagnosis not present
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
## 2020-06-16 NOTE — Progress Notes (Signed)
Virtual Visit via Telephone Note   I connected with Whitney Gonzalez on 06/16/20 at 3:00pm by telephone and verified that I am speaking with the correct person using two identifiers.   I discussed the limitations, risks, security and privacy concerns of performing an evaluation and management service by telephone and the availability of in person appointments. I also discussed with the patient that there may be a patient responsible charge related to this service. The patient expressed understanding and agreed to proceed.   I discussed the assessment and treatment plan with the patient. The patient was provided an opportunity to ask questions and all were answered. The patient agreed with the plan and demonstrated an understanding of the instructions.   The patient was advised to call back or seek an in-person evaluation if the symptoms worsen or if the condition fails to improve as anticipated.   I provided 1 hour of non-face-to-face time during this encounter.     Shade Flood, LCSW, LCAS _____________________________ THERAPIST PROGRESS NOTE   Session Time: 3:00pm - 4:00pm  Location: Patient: Patient Home Provider: [facility]     Participation Level: Active    Behavioral Response: Alert, anxious mood   Type of Therapy:  Individual Therapy   Treatment Goals addressed: Medication compliance; Depression and anxiety management; Resolving Interpersonal Issues in Relationship; Employment    Interventions: CBT, 5-4-3-2-1 grounding technique   Summary: Whitney Gonzalez is a 29 year old Caucasian female that prefers to go by Whitney Gonzalez" and presented for therapy session today via telephone with diagnoses of Generalized Anxiety Disorder, PTSD, and Bipolar I disorder.      Suicidal/Homicidal: None, without plan or intent.     Therapist Response: Clinician spoke with Whitney Gonzalez today via telephone, as she reported continuing to experience issues with virtual sessions due to unreliable  internet, and needs to remain at home to care for young son.  Clinician assessed for safety, sobriety, and medication compliance.  Whitney Gonzalez spoke in a manner that was alert, oriented x5, with no evidence or self-report of SI/HI or A/V H.  Whitney Gonzalez reported that she continues taking medication as prescribed and denied alcohol or illicit substance use.  Clinician inquired about Whitney Gonzalez's emotional ratings today, as well as any significant changes in thoughts, feelings, or behavior since previous check-in. Whitney Gonzalez reported scores of 3/10 for depression and 4/10 for anxiety today, stating "Today isn't awful, but its not great.  I guess I'm feeling somewhere in the middle".  Whitney Gonzalez reported that she last experienced a panic attack on the 8th or 9th last week due to increasing stress from work/life imbalance and stated "Its got me feeling mentally and physically exhausted". Clinician empathized with Whitney Gonzalez regarding recent increased stress, and inquired about how she is attempting to cope with employment issue at present.  Whitney Gonzalez reported that she is working closely with nursing and psychiatrist to acquire suitable FMLA paperwork so that she does not end up experiencing reoccurrence of conversion symptoms should stress become unmanageable.  She reported that she is also trying to set 3 realistic goals to achieve each day to stay productive, and continues to work closely with her boyfriend to resolve communication issues so that she will feel greater support.  Whitney Gonzalez reported that she was particularly pleased to receive a crossbow for her birthday last week, and plans to add archery to self-care routine, as she believes it will provide exercise, build confidence, and offer stress outlet.  Whitney Gonzalez reported that she would like to learn additional coping  skills to manage panic, noting that she has felt symptoms of increased heartrate and hyperventilation recently, but "Haven't had a full blown one again yet".  Clinician offered to teach  Whitney Gonzalez 5-4-3-2-1 grounding technique today in session to help temporarily distract her from troubling thoughts and feelings until calm was achieved.  Clinician guided her through process of scanning her environment and identifying 5 things she could see, 4 things she could touch, 3 things she could hear, 2 she could smell, and 1 thing she could taste.  Intervention was effective, as evidenced by Whitney Gonzalez successfully participating in exercise, and reporting that it did appear to be helpful for redirecting her focus elsewhere, so she wrote down how to perform it, and will begin practicing regularly.  Whitney Gonzalez stated "I'm willing to do whatever it takes to stay calmer and avoid becoming dependent on my antianxiety medication".  Clinician will continue to monitor.   Plan: Follow up again virtually in 2 weeks.   Diagnosis: Generalized Anxiety Disorder, PTSD, and Bipolar I disorder   Shade Flood, LCSW, LCAS 06/16/20

## 2020-06-18 ENCOUNTER — Ambulatory Visit (INDEPENDENT_AMBULATORY_CARE_PROVIDER_SITE_OTHER): Payer: Medicaid Other | Admitting: Licensed Clinical Social Worker

## 2020-06-18 ENCOUNTER — Other Ambulatory Visit: Payer: Self-pay

## 2020-06-18 DIAGNOSIS — F319 Bipolar disorder, unspecified: Secondary | ICD-10-CM | POA: Diagnosis not present

## 2020-06-18 DIAGNOSIS — F411 Generalized anxiety disorder: Secondary | ICD-10-CM | POA: Diagnosis not present

## 2020-06-18 DIAGNOSIS — F4310 Post-traumatic stress disorder, unspecified: Secondary | ICD-10-CM
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
## 2020-06-22 ENCOUNTER — Telehealth (HOSPITAL_COMMUNITY): Payer: Self-pay | Admitting: Emergency Medicine

## 2020-06-22 ENCOUNTER — Emergency Department (INDEPENDENT_AMBULATORY_CARE_PROVIDER_SITE_OTHER)
Admission: EM | Admit: 2020-06-22 | Discharge: 2020-06-22 | Disposition: A | Discharge status: Home / Self Care | Payer: Medicaid Other | Source: Home / Self Care

## 2020-06-22 ENCOUNTER — Other Ambulatory Visit: Payer: Self-pay

## 2020-06-22 ENCOUNTER — Encounter: Payer: Self-pay | Admitting: Emergency Medicine

## 2020-06-22 DIAGNOSIS — B370 Candidal stomatitis: Secondary | ICD-10-CM | POA: Insufficient documentation

## 2020-06-22 DIAGNOSIS — T7840XA Allergy, unspecified, initial encounter: Secondary | ICD-10-CM

## 2020-06-22 HISTORY — DX: Candidal stomatitis: B37.0

## 2020-06-22 MED ORDER — PREDNISONE 10 MG PO TABS
20.0000 mg | ORAL_TABLET | Freq: Every day | ORAL | 0 refills | Status: DC
Start: 2020-06-22 — End: 2021-05-31

## 2020-06-22 MED ORDER — NYSTATIN 100000 UNIT/ML MT SUSP: 500000.0000 [IU] | Freq: Four times a day (QID) | OROMUCOSAL | 0 refills | Status: AC

## 2020-06-22 NOTE — Discharge Instructions (Addendum)
May use Zantac (24-hour Pepcid) along with prednisone

## 2020-06-22 NOTE — Telephone Encounter (Signed)
Verbal orders from Dr Sabra Heck to add nystatin to discharge prescriptions - [facility] - script sent to pt's pharmacy -[place]

## 2020-06-22 NOTE — ED Triage Notes (Signed)
Pt has multiple allergies- pt was exposed to latex on Saturday - lips swelled up - now peeling Pt has an epi pen - did not use Pt's voice is hoarse - O2 sats 99% on room air Pt now has a rash to upper back & buttocks & peri area - c/o itching  Benadryl & pepcid last night No OTC meds  No COVID vaccine
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
## 2020-06-23 ENCOUNTER — Ambulatory Visit: Payer: Medicaid Other | Admitting: Physical Therapy

## 2020-06-23 ENCOUNTER — Encounter: Payer: Self-pay | Admitting: Physical Therapy

## 2020-06-23 ENCOUNTER — Other Ambulatory Visit: Payer: Self-pay

## 2020-06-23 DIAGNOSIS — R262 Difficulty in walking, not elsewhere classified: Secondary | ICD-10-CM | POA: Diagnosis not present

## 2020-06-23 DIAGNOSIS — R252 Cramp and spasm: Secondary | ICD-10-CM

## 2020-06-23 DIAGNOSIS — M6281 Muscle weakness (generalized): Secondary | ICD-10-CM
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
## 2020-06-28 ENCOUNTER — Ambulatory Visit: Payer: Medicaid Other | Admitting: Physical Therapy

## 2020-06-28 ENCOUNTER — Other Ambulatory Visit: Payer: Self-pay

## 2020-06-28 ENCOUNTER — Encounter: Payer: Self-pay | Admitting: Physical Therapy

## 2020-06-28 DIAGNOSIS — R252 Cramp and spasm: Secondary | ICD-10-CM

## 2020-06-28 DIAGNOSIS — R262 Difficulty in walking, not elsewhere classified: Secondary | ICD-10-CM

## 2020-06-28 DIAGNOSIS — M6281 Muscle weakness (generalized): Secondary | ICD-10-CM
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
## 2020-06-30 ENCOUNTER — Other Ambulatory Visit: Payer: Self-pay

## 2020-06-30 ENCOUNTER — Ambulatory Visit (INDEPENDENT_AMBULATORY_CARE_PROVIDER_SITE_OTHER): Payer: Medicaid Other | Admitting: Licensed Clinical Social Worker

## 2020-06-30 DIAGNOSIS — F4310 Post-traumatic stress disorder, unspecified: Secondary | ICD-10-CM

## 2020-06-30 DIAGNOSIS — F411 Generalized anxiety disorder: Secondary | ICD-10-CM | POA: Diagnosis not present

## 2020-06-30 DIAGNOSIS — F319 Bipolar disorder, unspecified: Secondary | ICD-10-CM

## 2020-06-30 NOTE — Progress Notes (Signed)
Virtual Visit via Telephone Note   I connected with Whitney Gonzalez on 06/30/20 at 3:00pm by telephone and verified that I am speaking with the correct person using two identifiers.   I discussed the limitations, risks, security and privacy concerns of performing an evaluation and management service by telephone and the availability of in person appointments. I also discussed with the patient that there may be a patient responsible charge related to this service. The patient expressed understanding and agreed to proceed.   I discussed the assessment and treatment plan with the patient. The patient was provided an opportunity to ask questions and all were answered. The patient agreed with the plan and demonstrated an understanding of the instructions.   The patient was advised to call back or seek an in-person evaluation if the symptoms worsen or if the condition fails to improve as anticipated.   I provided 1 hour of non-face-to-face time during this encounter.     Shade Flood, LCSW, LCAS _____________________________ THERAPIST PROGRESS NOTE   Session Time: 3:00pm - 4:00pm  Location: Patient: Patient Home Provider: [facility]     Participation Level: Active    Behavioral Response: Alert, depressed mood   Type of Therapy:  Individual Therapy   Treatment Goals addressed: Medication compliance; Depression and anxiety management; Resolving Interpersonal Issues in Relationship   Interventions: CBT, assertive communication and healthy boundaries   Summary: Whitney Gonzalez is a 29 year old Caucasian female that prefers to go by "Whitney Gonzalez" and presented for therapy session today via telephone with diagnoses of Generalized Anxiety Disorder, PTSD, and Bipolar I disorder.      Suicidal/Homicidal: None, without plan or intent.     Therapist Response: Clinician spoke with Whitney Gonzalez today via telephone, as she reported continuing to experience issues with virtual appointments due to  unreliable internet, and needs to remain at home to care for young son.  Clinician assessed for safety, sobriety, and medication compliance. She spoke in a manner that was alert, oriented x5, with no evidence or self-report of SI/HI or A/V H.  Whitney Gonzalez reported that she continues to take medication as prescribed and denied alcohol or illicit substance use.  Clinician inquired about Whitney Gonzalez's emotional ratings today, as well as any significant changes in thoughts, feelings, or behavior since previous check-in. Whitney Gonzalez reported scores of 4/10 for depression and 1-2/10 for anxiety today.  Whitney Gonzalez reported that since the last session, her boyfriend has been on a trip out of state, so the distance has been helpful for giving her space and time to reflect on what transpired between them.  She reported that they have a couple's counseling session scheduled for August 2nd, and she is eager to get started so that she can see whether they can resolve interpersonal issues together.  Clinician praised Whitney Gonzalez for following through with counseling appointment and inquired about how her overall stress level has since her partner moved out following outbursts.  Whitney Gonzalez reported that this has been a welcome change, and she has also become increasingly more independent, which has boosted her self-esteem.  Whitney Gonzalez reported that they have only spoken on the phone since he has been away, and she continues to speak her mind more openly now, stating "I've realized that I need to have more space for myself and won't let him walk all over me anymore.  I have more power than I thought".  Clinician praised Whitney Gonzalez for her improvements in setting personal boundaries, and speaking more assertively, and inquired about her personal  agenda for upcoming couples session to help organize thoughts and ensure needs are clearly expressed.  Whitney Gonzalez reported that she intends to bring up their miscommunication issues, try to identify mutual goals for improvement, determine  where they will live, how financial arrangements can be squared away to support the child, and learn how to avoid arguments while working together.  Interventions were effective, as evidenced by Whitney Gonzalez reporting that this session provided her with a safe space to process bottled up feelings, reinforcement for positive behavioral changes being made, and assistance in preparing for start of couples counseling.  Whitney Gonzalez reported that she would make a list of her goals ahead of upcoming appointment and speak with boyfriend tonight about importance of their commitment to the process once he returns from trip.  Clinician will continue to monitor.   Plan: Follow up again virtually in 2 weeks.   Diagnosis: Generalized Anxiety Disorder, PTSD, and Bipolar I disorder   Shade Flood, LCSW, LCAS 06/30/20

## 2020-07-01 ENCOUNTER — Encounter (HOSPITAL_COMMUNITY): Payer: Self-pay | Admitting: Psychiatry

## 2020-07-01 ENCOUNTER — Telehealth (INDEPENDENT_AMBULATORY_CARE_PROVIDER_SITE_OTHER): Payer: Medicaid Other | Admitting: Psychiatry

## 2020-07-01 ENCOUNTER — Other Ambulatory Visit: Payer: Self-pay

## 2020-07-01 VITALS — Wt 137.0 lb

## 2020-07-01 DIAGNOSIS — F4310 Post-traumatic stress disorder, unspecified: Secondary | ICD-10-CM

## 2020-07-01 DIAGNOSIS — F319 Bipolar disorder, unspecified: Secondary | ICD-10-CM | POA: Diagnosis not present

## 2020-07-01 DIAGNOSIS — F411 Generalized anxiety disorder: Secondary | ICD-10-CM | POA: Diagnosis not present

## 2020-07-01 DIAGNOSIS — F449 Dissociative and conversion disorder, unspecified: Secondary | ICD-10-CM

## 2020-07-01 MED ORDER — BREXPIPRAZOLE 1 MG PO TABS: 1.0000 mg | ORAL_TABLET | Freq: Every day | ORAL | 0 refills | Status: DC
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
## 2020-07-02 ENCOUNTER — Telehealth (HOSPITAL_COMMUNITY): Payer: Self-pay

## 2020-07-02 ENCOUNTER — Encounter (HOSPITAL_COMMUNITY): Payer: Self-pay

## 2020-07-02 NOTE — Telephone Encounter (Signed)
[facility] [facility] [place] MEDICAID PRESCRIPTION COVERAGE APPROVED  [place] 1MG  TABLET PA CASE# [id number] EFFECTIVE 07/02/2020 TO 07/02/2021  NOTIFIED PHARMACY/ THEY HAVE TO ORDER IT TO BE IN ON Monday 07/05/20. STATED THEY WILL NOTIFY PATIENT

## 2020-07-07 ENCOUNTER — Ambulatory Visit: Payer: Medicaid Other | Attending: Internal Medicine | Admitting: Physical Therapy

## 2020-07-07 ENCOUNTER — Encounter: Payer: Self-pay | Admitting: Physical Therapy

## 2020-07-07 ENCOUNTER — Other Ambulatory Visit: Payer: Self-pay

## 2020-07-07 DIAGNOSIS — R252 Cramp and spasm: Secondary | ICD-10-CM

## 2020-07-07 DIAGNOSIS — R262 Difficulty in walking, not elsewhere classified: Secondary | ICD-10-CM | POA: Insufficient documentation

## 2020-07-07 DIAGNOSIS — M6281 Muscle weakness (generalized): Secondary | ICD-10-CM | POA: Diagnosis present
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
## 2020-07-13 ENCOUNTER — Ambulatory Visit (INDEPENDENT_AMBULATORY_CARE_PROVIDER_SITE_OTHER): Payer: Medicaid Other | Admitting: Licensed Clinical Social Worker

## 2020-07-13 ENCOUNTER — Other Ambulatory Visit: Payer: Self-pay

## 2020-07-13 DIAGNOSIS — F411 Generalized anxiety disorder: Secondary | ICD-10-CM | POA: Diagnosis not present

## 2020-07-13 DIAGNOSIS — F4310 Post-traumatic stress disorder, unspecified: Secondary | ICD-10-CM | POA: Diagnosis not present

## 2020-07-13 DIAGNOSIS — F319 Bipolar disorder, unspecified: Secondary | ICD-10-CM | POA: Diagnosis not present

## 2020-07-13 NOTE — Progress Notes (Signed)
Virtual Visit via Telephone Note   I connected with Whitney Gonzalez on 07/13/20 at 1:00pm by telephone and verified that I am speaking with the correct person using two identifiers.   I discussed the limitations, risks, security and privacy concerns of performing an evaluation and management service by telephone and the availability of in person appointments. I also discussed with the patient that there may be a patient responsible charge related to this service. The patient expressed understanding and agreed to proceed.   I discussed the assessment and treatment plan with the patient. The patient was provided an opportunity to ask questions and all were answered. The patient agreed with the plan and demonstrated an understanding of the instructions.   The patient was advised to call back or seek an in-person evaluation if the symptoms worsen or if the condition fails to improve as anticipated.   I provided 45 minutes of non-face-to-face time during this encounter.     Shade Flood, LCSW, LCAS _____________________________ THERAPIST PROGRESS NOTE   Session Time: 1:00pm - 1:45pm  Location: Patient: Patient Home Provider: [facility] [place] [facility]     Participation Level: Active    Behavioral Response: Alert, anxious mood   Type of Therapy:  Individual Therapy   Treatment Goals addressed: Medication compliance; Depression and anxiety management; Employment; Resolving Interpersonal Issues in Relationship   Interventions: CBT, treatment planning    Summary: Whitney Gonzalez is a 29 year old Caucasian female that prefers to go by "Whitney Gonzalez" and presented for therapy session today via telephone with diagnoses of Generalized Anxiety Disorder, PTSD, and Bipolar I disorder.      Suicidal/Homicidal: None, without plan or intent.     Therapist Response: Clinician spoke with Whitney Gonzalez today via telephone, as she reported continuing to experience issues with virtual appointments due to unreliable  internet, and needing to care for young son at home.  Clinician assessed for safety, sobriety, and medication compliance. Whitney Gonzalez spoke in a manner that was alert, oriented x5, with no evidence or self-report of SI/HI or A/V H.  Whitney Gonzalez reported ongoing compliance with medication and denied alcohol or illicit substance use.  Clinician inquired about Whitney Gonzalez's current emotional ratings, as well as any significant changes in thoughts, feelings, or behavior since last check-in. Whitney Gonzalez reported scores of 1/10 for depression and 6/10 for anxiety today.  Whitney Gonzalez reported that she has not experienced any panic attacks recently, but acknowledged that stress has been higher, so she has noticed some related symptoms at times, such as tightness in chest.  Whitney Gonzalez reported that stress has been higher due to ongoing FMLA issues with work, as well as disagreements with her boyfriend.  Clinician empathized with Whitney Gonzalez regarding present stressors and inquired about how she is currently addressing both areas of concern.  Whitney Gonzalez reported that her job has given her until the 21st of this month to sign over access to her medical records or risk losing her position, so she has scheduled an appointment with her psychiatrist Saturday to seek counsel on this decision, since she is unsure about medical rights.  She reported that her boyfriend is living with her again, and they engaged in their first couple's counseling appointment last Monday, which went well, and they plan to follow up once every other week to continue improving communication and conflict resolution skills.  Clinician was supportive of Whitney Gonzalez's plans to address present stressors and encouraged her to remain consistent with couples counseling to increase chance of long term, positive change within relationship.  Clinician  noted that more than 90 days have passed since last treatment plan update, and recommended revisiting this today.  Whitney Gonzalez was receptive to this, and collaborated to make  changes as follows:  Advertising copywriter virtually x1 every 2 weeks for therapy to identify where progress has been made, and any needs that need to be addressed; Meet with psychiatrist virtually once per month to check on efficacy of medication and any changes needed for dose/regimen if necessary; Taking medication daily as prescribed for maximum benefit in addressing symptom reduction and improvement in daily functioning; Reduce anxiety from average of 7/10 in severity down to 5/10 in next 90 days by engaging in relaxation and grounding techniques 2-3 times per day such as mindful breathing, progressive muscle relaxation, and/or guided imagery; Reduce depression from average of 4/10 in severity down to 2/10 in next 90 days by engaging in 1-2 hours of positive activities daily, such as reading, writing, socialization with friends, and/or getting outside for fresh air; Exercise 1 day per week for 1 hour depending upon energy level/exertion from work schedule to improve mental and physical wellbeing; Commit to outreaching positive friends 2-3x per week to increase self-awareness of thoughts, feelings, behavioral changes during treatment, and provide safe outlet for expressing oneself without judgment; 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  Whitney Gonzalez reported that she had no other issues to address today, and would plan to follow up virtually in  2 weeks.  Clinician will continue to monitor.   Plan: Follow up again virtually in 2 weeks.   Diagnosis: Generalized Anxiety Disorder, PTSD, and Bipolar I disorder   Shade Flood, LCSW, LCAS 07/13/20

## 2020-07-14 ENCOUNTER — Encounter: Payer: Medicaid Other | Admitting: Physical Therapy

## 2020-07-17 ENCOUNTER — Other Ambulatory Visit: Payer: Self-pay

## 2020-07-17 ENCOUNTER — Encounter (HOSPITAL_COMMUNITY): Payer: Self-pay | Admitting: Psychiatry

## 2020-07-17 ENCOUNTER — Telehealth (INDEPENDENT_AMBULATORY_CARE_PROVIDER_SITE_OTHER): Payer: Medicaid Other | Admitting: Psychiatry

## 2020-07-17 DIAGNOSIS — F449 Dissociative and conversion disorder, unspecified: Secondary | ICD-10-CM | POA: Diagnosis not present

## 2020-07-17 DIAGNOSIS — F319 Bipolar disorder, unspecified: Secondary | ICD-10-CM

## 2020-07-17 DIAGNOSIS — F411 Generalized anxiety disorder: Secondary | ICD-10-CM

## 2020-07-17 DIAGNOSIS — F4310 Post-traumatic stress disorder, unspecified: Secondary | ICD-10-CM

## 2020-07-17 MED ORDER — CITALOPRAM HYDROBROMIDE 20 MG PO TABS: 30.0000 mg | ORAL_TABLET | Freq: Every day | ORAL | 2 refills | Status: DC

## 2020-07-17 MED ORDER — BREXPIPRAZOLE 1 MG PO TABS: 1.0000 mg | ORAL_TABLET | Freq: Every day | ORAL | 0 refills | Status: DC
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
## 2020-07-22 ENCOUNTER — Encounter: Payer: Self-pay | Admitting: Dermatology

## 2020-07-22 ENCOUNTER — Other Ambulatory Visit: Payer: Self-pay

## 2020-07-22 ENCOUNTER — Ambulatory Visit: Payer: Medicaid Other | Admitting: Dermatology

## 2020-07-22 VITALS — BP 136/87 | HR 92

## 2020-07-22 DIAGNOSIS — D2262 Melanocytic nevi of left upper limb, including shoulder: Secondary | ICD-10-CM | POA: Diagnosis not present

## 2020-07-22 DIAGNOSIS — L578 Other skin changes due to chronic exposure to nonionizing radiation: Secondary | ICD-10-CM

## 2020-07-22 DIAGNOSIS — Z1283 Encounter for screening for malignant neoplasm of skin: Secondary | ICD-10-CM | POA: Diagnosis not present

## 2020-07-22 DIAGNOSIS — D239 Other benign neoplasm of skin, unspecified: Secondary | ICD-10-CM

## 2020-07-22 DIAGNOSIS — L814 Other melanin hyperpigmentation: Secondary | ICD-10-CM

## 2020-07-22 DIAGNOSIS — D1800 Hemangioma unspecified site: Secondary | ICD-10-CM

## 2020-07-22 DIAGNOSIS — L731 Pseudofolliculitis barbae: Secondary | ICD-10-CM

## 2020-07-22 DIAGNOSIS — L709 Acne, unspecified: Secondary | ICD-10-CM

## 2020-07-22 DIAGNOSIS — L821 Other seborrheic keratosis: Secondary | ICD-10-CM

## 2020-07-22 DIAGNOSIS — D489 Neoplasm of uncertain behavior, unspecified: Secondary | ICD-10-CM

## 2020-07-22 DIAGNOSIS — L739 Follicular disorder, unspecified: Secondary | ICD-10-CM

## 2020-07-22 DIAGNOSIS — D229 Melanocytic nevi, unspecified: Secondary | ICD-10-CM

## 2020-07-22 HISTORY — DX: Other benign neoplasm of skin, unspecified: D23.9

## 2020-07-22 MED ORDER — TRETINOIN 0.025 % EX CREA: TOPICAL_CREAM | CUTANEOUS | 1 refills | Status: DC

## 2020-07-22 MED ORDER — SPIRONOLACTONE 100 MG PO TABS: 100.0000 mg | ORAL_TABLET | Freq: Every day | ORAL | 1 refills | Status: DC

## 2020-07-22 NOTE — Progress Notes (Signed)
New Patient Visit  Subjective  Whitney Gonzalez is a 29 y.o. female who presents for the following: New Patient (Initial Visit) here for full body skin exam and skin cancer screening. She also notes some difficulty with acne.   Patient presents today as a new patient, would like TBSE, has an area of concern L upper arm, lips stay dry, feet peel, lymph nodes stay swollen sometimes. No h/o skin cancer, Family h/o MM in eye (Mother)  The following portions of the chart were reviewed this encounter and updated as appropriate:  Tobacco  Allergies  Meds  Problems  Med Hx  Surg Hx  Fam Hx      Review of Systems:  No other skin or systemic complaints except as noted in HPI or Assessment and Plan.  Objective  Well appearing patient in no apparent distress; mood and affect are within normal limits.  A full examination was performed including scalp, head, eyes, ears, nose, lips, neck, chest, axillae, abdomen, back, buttocks, bilateral upper extremities, bilateral lower extremities, hands, feet, fingers, toes, fingernails, and toenails. All findings within normal limits unless otherwise noted below.  Objective  Superpubic: Ingrown hair  Objective  Right Iower abdomen: Tan patch  Images      Objective  face: 2 + open and closed comedones, scattered inflammatory papules at face  Objective  Left Hip (side) - Posterior: Follicular-based erythematous papules and pustules.   Objective  Left Upper Arm - Anterior: 0.5 cm brown and pink papule       Today's Vitals   07/22/20 1045  BP: 136/87  Pulse: 92   There is no height or weight on file to calculate BMI.   Assessment & Plan  Ingrown hair Superpubic  Lentigines Right Iower abdomen  The patient will observe these symptoms, and report promptly any worsening or unexpected persistence.  If well, may return prn.   Acne, unspecified acne type face  Start tretinoin 0.025% cream pea size once daily qhs Start  Spironolactone 100 mg take 1 tab po QHS  Spironolactone can cause increased urination and cause blood pressure to decrease. Please watch for signs of lightheadedness and be cautious when changing position. It can sometimes cause breast tenderness or an irregular period in premenopausal women. It can also increase potassium. The increase in potassium usually is not a concern unless you are taking other medicines that also increase potassium, so please be sure your doctor knows all of the other medications you are taking. This medication should not be taken  by pregnant women.  Topical retinoid medications like tretinoin/Retin-A, adapalene/Differin, tazarotene/Fabior, and Epiduo/Epiduo Forte can cause dryness and irritation when first started. Only apply a pea-sized amount to the entire affected area. Avoid applying it around the eyes, edges of mouth and creases at the nose. If you experience irritation, use a good moisturizer first and/or apply the medicine less often. If you are doing well with the medicine, you can increase how often you use it until you are applying every night. Be careful with sun protection while using this medication as it can make you sensitive to the sun. This medicine should not be used by pregnant women.    tretinoin (RETIN-A) 0.025 % cream - face  spironolactone (ALDACTONE) 100 MG tablet - face  Folliculitis Left Hip (side) - Posterior  Recommend CLN sport wash  Neoplasm of uncertain behavior Left Upper Arm - Anterior  Epidermal / dermal shaving  Lesion diameter (cm):  0.7 Informed consent: discussed and consent  obtained   Timeout: patient name, date of birth, surgical site, and procedure verified   Anesthesia: the lesion was anesthetized in a standard fashion   Anesthetic:  1% lidocaine w/ epinephrine 1-100,000 buffered w/ 8.4% NaHCO3 Instrument used: flexible razor blade   Outcome: patient tolerated procedure well   Post-procedure details: sterile dressing  applied and wound care instructions given   Dressing type: petrolatum and pressure dressing    Specimen 1 - Surgical pathology Differential Diagnosis: R/O Atypia Check Margins: No 0.5 cm brown and pink papule  Shave removal today   Lentigines - Scattered tan macules - Discussed due to sun exposure - Benign, observe - Call for any changes  Seborrheic Keratoses - Stuck-on, waxy, tan-brown papules and plaques  - Discussed benign etiology and prognosis. - Observe - Call for any changes  Melanocytic Nevi - Tan-brown and/or pink-flesh-colored symmetric macules and papules - Benign appearing on exam today - Observation - Call clinic for new or changing moles - Recommend daily use of broad spectrum spf 30+ sunscreen to sun-exposed areas.   Hemangiomas - Red papules - Discussed benign nature - Observe - Call for any changes  Actinic Damage - diffuse scaly erythematous macules with underlying dyspigmentation - Recommend daily broad spectrum sunscreen SPF 30+ to sun-exposed areas, reapply every 2 hours as needed.  - Call for new or changing lesions.  Skin cancer screening performed today.   Return in about 6 weeks (around 09/02/2020) for Acne.  IDonzetta Kohut, CMA, am acting as scribe for Forest Gleason, MD .   Documentation: I have reviewed the above documentation for accuracy and completeness, and I agree with the above.  Forest Gleason, MD

## 2020-07-22 NOTE — Patient Instructions (Addendum)
Recommend daily broad spectrum sunscreen SPF 30+ to sun-exposed areas, reapply every 2 hours as needed. Call for new or changing lesions.  Recommend Baby Foot treatment  Recommend CLN sport wash  Recommend Serica Scar gel for biopsy wound after completely healed  Wound Care Instructions  1. Cleanse wound gently with soap and water once a day then pat dry with clean gauze. Apply a thing coat of Petrolatum (petroleum jelly, "Vaseline") over the wound (unless you have an allergy to this). We recommend that you use a new, sterile tube of Vaseline. Do not pick or remove scabs. Do not remove the yellow or white "healing tissue" from the base of the wound.  2. Cover the wound with fresh, clean, nonstick gauze and secure with paper tape. You may use Band-Aids in place of gauze and tape if the would is small enough, but would recommend trimming much of the tape off as there is often too much. Sometimes Band-Aids can irritate the skin.  3. You should call the office for your biopsy report after 1 week if you have not already been contacted.  4. If you experience any problems, such as abnormal amounts of bleeding, swelling, significant bruising, significant pain, or evidence of infection, please call the office immediately.  5. FOR ADULT SURGERY PATIENTS: If you need something for pain relief you may take 1 extra strength Tylenol (acetaminophen) AND 2 Ibuprofen (200mg  each) together every 4 hours as needed for pain. (do not take these if you are allergic to them or if you have a reason you should not take them.) Typically, you may only need pain medication for 1 to 3 days.     Melanoma ABCDEs  Melanoma is the most dangerous type of skin cancer, and is the leading cause of death from skin disease.  You are more likely to develop melanoma if you:  Have light-colored skin, light-colored eyes, or red or blond hair  Spend a lot of time in the sun  Tan regularly, either outdoors or in a tanning  bed  Have had blistering sunburns, especially during childhood  Have a close family member who has had a melanoma  Have atypical moles or large birthmarks  Early detection of melanoma is key since treatment is typically straightforward and cure rates are extremely high if we catch it early.   The first sign of melanoma is often a change in a mole or a new dark spot.  The ABCDE system is a way of remembering the signs of melanoma.  A for asymmetry:  The two halves do not match. B for border:  The edges of the growth are irregular. C for color:  A mixture of colors are present instead of an even brown color. D for diameter:  Melanomas are usually (but not always) greater than 22mm - the size of a pencil eraser. E for evolution:  The spot keeps changing in size, shape, and color.  Please check your skin once per month between visits. You can use a small mirror in front and a large mirror behind you to keep an eye on the back side or your body.   If you see any new or changing lesions before your next follow-up, please call to schedule a visit.  Please continue daily skin protection including broad spectrum sunscreen SPF 30+ to sun-exposed areas, reapplying every 2 hours as needed when you're outdoors.    Spironolactone can cause increased urination and cause blood pressure to decrease. Please watch for signs of  lightheadedness and be cautious when changing position. It can sometimes cause breast tenderness or an irregular period in premenopausal women. It can also increase potassium. The increase in potassium usually is not a concern unless you are taking other medicines that also increase potassium, so please be sure your doctor knows all of the other medications you are taking. This medication should not be taken  by pregnant women.  Topical retinoid medications like tretinoin/Retin-A, adapalene/Differin, tazarotene/Fabior, and Epiduo/Epiduo Forte can cause dryness and irritation when first  started. Only apply a pea-sized amount to the entire affected area. Avoid applying it around the eyes, edges of mouth and creases at the nose. If you experience irritation, use a good moisturizer first and/or apply the medicine less often. If you are doing well with the medicine, you can increase how often you use it until you are applying every night. Be careful with sun protection while using this medication as it can make you sensitive to the sun. This medicine should not be used by pregnant women.

## 2020-07-27 ENCOUNTER — Telehealth: Payer: Self-pay

## 2020-07-27 ENCOUNTER — Ambulatory Visit: Payer: Medicaid Other | Admitting: Dermatology

## 2020-07-27 ENCOUNTER — Telehealth: Payer: Self-pay | Admitting: Dermatology

## 2020-07-27 MED ORDER — MUPIROCIN 2 % EX OINT
1.0000 | TOPICAL_OINTMENT | Freq: Three times a day (TID) | CUTANEOUS | 0 refills | Status: DC
Start: 2020-07-27 — End: 2021-05-31

## 2020-07-27 NOTE — Telephone Encounter (Signed)
Patient called and informed of biopsy results, patient verbalized understanding. Patient has appt for surgery 07/28/20 and TBSE 11/04/20.

## 2020-07-27 NOTE — Progress Notes (Signed)
Skin , left upper arm-anterior DYSPLASTIC COMPOUND NEVUS WITH SEVERE ATYPIA, PERIPHERAL MARGIN INVOLVED --> Excision  Discussed results with patient. Plan excision. Will also consider biopsy of the other nevus nearby on her arm which is slightly irregular.  MAs please schedule patient for excision.  Please schedule for FBSE in 6 months.

## 2020-07-27 NOTE — Telephone Encounter (Signed)
Reviewed results (severely atypical nevus) with patient. Excision planned and will also plan 6 month FBSE and recheck another mole and consider biopsy when she comes for excision.  Patient notes biopsy site was tender and oozing last week. It is now feeling better but it looks like there's a yellow scab. It is slightly tender to palpation. Will start mupirocin.   Patient notes she may be pregnant.  Advised to hold tretinoin and spironolactone.    Will send in mupirocin [facility]opher Oppenheim at [facility]) for biopsy site.

## 2020-07-28 ENCOUNTER — Ambulatory Visit: Payer: Medicaid Other | Admitting: Dermatology

## 2020-07-28 ENCOUNTER — Other Ambulatory Visit: Payer: Self-pay

## 2020-07-28 ENCOUNTER — Telehealth: Payer: Self-pay

## 2020-07-28 DIAGNOSIS — L309 Dermatitis, unspecified: Secondary | ICD-10-CM

## 2020-07-28 DIAGNOSIS — L249 Irritant contact dermatitis, unspecified cause: Secondary | ICD-10-CM

## 2020-07-28 MED ORDER — TRIAMCINOLONE ACETONIDE 0.1 % EX OINT: 1.0000 "application " | TOPICAL_OINTMENT | Freq: Two times a day (BID) | CUTANEOUS | 0 refills | Status: DC | PRN

## 2020-07-28 NOTE — Progress Notes (Signed)
Visit canceled and rescheduled

## 2020-07-28 NOTE — Telephone Encounter (Signed)
Sent in [facility] 0.1% ointment for patient to use to areas of dermatitis 2ndary to bandaid use near biopsy site. Patient advised to avoid face, groin, underarms. Surgery scheduled for 9/15, JS

## 2020-08-02 ENCOUNTER — Encounter: Payer: Self-pay | Admitting: Dermatology

## 2020-08-02 ENCOUNTER — Other Ambulatory Visit: Payer: Self-pay

## 2020-08-02 ENCOUNTER — Ambulatory Visit (INDEPENDENT_AMBULATORY_CARE_PROVIDER_SITE_OTHER): Payer: Medicaid Other | Admitting: Licensed Clinical Social Worker

## 2020-08-02 DIAGNOSIS — F411 Generalized anxiety disorder: Secondary | ICD-10-CM | POA: Diagnosis not present

## 2020-08-02 DIAGNOSIS — F4310 Post-traumatic stress disorder, unspecified: Secondary | ICD-10-CM

## 2020-08-02 DIAGNOSIS — F319 Bipolar disorder, unspecified: Secondary | ICD-10-CM | POA: Diagnosis not present
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
## 2020-08-04 ENCOUNTER — Telehealth (INDEPENDENT_AMBULATORY_CARE_PROVIDER_SITE_OTHER): Payer: Medicaid Other | Admitting: Dermatology

## 2020-08-04 ENCOUNTER — Other Ambulatory Visit: Payer: Self-pay

## 2020-08-04 DIAGNOSIS — R1909 Other intra-abdominal and pelvic swelling, mass and lump: Secondary | ICD-10-CM | POA: Diagnosis not present

## 2020-08-04 DIAGNOSIS — M79602 Pain in left arm: Secondary | ICD-10-CM

## 2020-08-04 DIAGNOSIS — R61 Generalized hyperhidrosis: Secondary | ICD-10-CM | POA: Diagnosis not present

## 2020-08-04 DIAGNOSIS — W57XXXA Bitten or stung by nonvenomous insect and other nonvenomous arthropods, initial encounter: Secondary | ICD-10-CM

## 2020-08-04 DIAGNOSIS — S80861A Insect bite (nonvenomous), right lower leg, initial encounter: Secondary | ICD-10-CM

## 2020-08-04 NOTE — Progress Notes (Signed)
   Follow-Up Visit   Subjective  Whitney Gonzalez is a 29 y.o. female who presents for the following: Skin Problem (Patient concerned about severe pain 3-4 inches below biopsy site at left upper arm since last week. ). She is scheduled to see PCP tomorrow. Patient also complains that the last 4 days she has had a swollen lymph node at right groin that has made it hard to walk and is causing her to limp. Also has been sweating profusely but not having any chills or fever.  Patient located in [place] at time of visit.  Patient is aware that insurance will be billed for this appointment.  [place] is located in [place], [place]    The following portions of the chart were reviewed this encounter and updated as appropriate:  Tobacco  Allergies  Meds  Problems  Med Hx  Surg Hx  Fam Hx      Review of Systems:  No other skin or systemic complaints except as noted in HPI or Assessment and Plan.  Objective  Well appearing patient in no apparent distress; mood and affect are within normal limits.  A focused examination was performed including left arm, right leg, face. Relevant physical exam findings are noted in the Assessment and Plan.  Objective  Chest - Medial [facility]):   Objective  Right Leg: Pink papules  Objective  Left Upper Arm: Healing biopsy site   Assessment & Plan  Night sweats Chest - Medial (Center)  No fever or chills per patient  Keep appointment with PCP tomorrow as scheduled  Bug bite without infection, initial encounter Right Leg  May start [place] 0.1% cream twice daily as needed. Patient has. Avoid applying to face, groin, and axilla. Use as directed. Risk of skin atrophy with long-term use reviewed.   Patient advised probably unrelated to swollen lymph node/lump at right groin. Recommend follow-up with PCP.  Lump in the groin Right Groin  Keep appointment with PCP tomorrow as scheduled  Arm pain, central, left Left Upper  Arm  Arm pain is distal to the biopsy site   Bx proven severe dysplastic nevus, does not appear infected on exam today Minimal tenderness at biopsy site per patient Excision planned  Patient advised that arm pain distal to the biopsy site is not likely to be related  Continue wound care with mupirocin Recommend ibuprofen or tylenol 500mg  x 2 up to 3 times daily q8 hours as needed for the discomfort more distal in her arm  Patient was hospitalized early this year for numbness and tingling of the left arm, no source was determined  Keep appointment with PCP tomorrow as scheduled.   Return as scheduled, for Surgery.   Video visit 22 minutes  I Margarette Asal scribed for [facility] MD.   Documentation: I have reviewed the above documentation for accuracy and completeness, and I agree with the above.  Forest Gleason, MD

## 2020-08-05 ENCOUNTER — Telehealth: Payer: Self-pay | Admitting: Obstetrics & Gynecology

## 2020-08-05 NOTE — Telephone Encounter (Signed)
Patient called into the office wanting to schedule an appointment because she has not had a period since July 8th and is also experiencing swollen lymph nodes in/near her groin. Patient was given an appointment on 10/6. Patient wanted to know if there was anything that she had to do in the meantime until her appointment. Patient instructed that a message will be sent to the nurses and they will contact her as soon as they can. Patient verbalized understanding and message sent to clinical pool.

## 2020-08-05 NOTE — Addendum Note (Signed)
Addended by: Alfonso Patten on: 08/05/2020 03:01 PM   Modules accepted: Level of Service

## 2020-08-05 NOTE — Telephone Encounter (Signed)
Called pt and discussed her concerns. She stated that she was seen by her PCP today (Dr. Orland Mustard) and he examined her as well as did some labs. She states that he was concerned and recommended that she call her [facility]. She has performed several pregnancy tests which have been negative. I advised pt that she should monitor her sx and there is nothing else to do at this point. Pt was informed that there is no harm to her body if she doesn't have a period for a couple months. This will be evaluated further @ her scheduled visit on 10/7. If possible, she should bring copies of the lab results performed by Dr. Orland Mustard. Pt should call her PCP (initially) if she feels her condition worsens. She may contact our office again as well for new or worsening sx.

## 2020-08-11 ENCOUNTER — Ambulatory Visit: Payer: Medicaid Other | Admitting: Dermatology

## 2020-08-16 ENCOUNTER — Other Ambulatory Visit: Payer: Self-pay

## 2020-08-16 ENCOUNTER — Telehealth: Payer: Self-pay

## 2020-08-16 ENCOUNTER — Ambulatory Visit (INDEPENDENT_AMBULATORY_CARE_PROVIDER_SITE_OTHER): Payer: Medicaid Other | Admitting: Licensed Clinical Social Worker

## 2020-08-16 DIAGNOSIS — F411 Generalized anxiety disorder: Secondary | ICD-10-CM

## 2020-08-16 DIAGNOSIS — F319 Bipolar disorder, unspecified: Secondary | ICD-10-CM

## 2020-08-16 DIAGNOSIS — F4310 Post-traumatic stress disorder, unspecified: Secondary | ICD-10-CM | POA: Diagnosis not present

## 2020-08-16 NOTE — Telephone Encounter (Signed)
Pt left voicemail informing us of her negative Covid-19 test result. She said that she will bring the result to show you on 08/18/20 for her surgery.

## 2020-08-16 NOTE — Progress Notes (Signed)
Virtual Visit via Telephone Note   I connected with Whitney Gonzalez on 08/16/20 at 1:00pm by telephone and verified that I am speaking with the correct person using two identifiers.   I discussed the limitations, risks, security and privacy concerns of performing an evaluation and management service by telephone and the availability of in person appointments. I also discussed with the patient that there may be a patient responsible charge related to this service. The patient expressed understanding and agreed to proceed.   I discussed the assessment and treatment plan with the patient. The patient was provided an opportunity to ask questions and all were answered. The patient agreed with the plan and demonstrated an understanding of the instructions.   The patient was advised to call back or seek an in-person evaluation if the symptoms worsen or if the condition fails to improve as anticipated.   I provided 45 minutes of non-face-to-face time during this encounter.     Shade Flood, LCSW, LCAS _____________________________ THERAPIST PROGRESS NOTE   Session Time: 1:00pm - 1:45pm  Location: Patient: Patient Home Provider: [facility] [place]     Participation Level: Active    Behavioral Response: Alert, anxious mood   Type of Therapy:  Individual Therapy   Treatment Goals addressed: Medication compliance; Depression and anxiety management; Employment; Resolving Interpersonal Issues in Relationship   Interventions: CBT   Summary: Whitney Gonzalez is a 29 year old Caucasian female that prefers to go by "Whitney Gonzalez" and presented for therapy session today via telephone with diagnoses of Generalized Anxiety Disorder, PTSD, and Bipolar I disorder.      Suicidal/Homicidal: None, without plan or intent.     Therapist Response: Clinician spoke with [name] today via telephone, as she continues reporting issues with virtual appointments due to unreliable internet access, and needing to care  for young son at home.  Clinician assessed for safety, sobriety, and medication compliance. Whitney Gonzalez spoke in a manner that was alert, oriented x5, with no evidence or self-report of SI/HI or A/V H.  Whitney Gonzalez reported ongoing compliance with medication and denied any use of alcohol or illicit substances.  Clinician inquired about Whitney Gonzalez's current emotional ratings, as well as any significant changes in thoughts, feelings, or behavior since last check-in. Whitney Gonzalez reported scores of 2/10 for depression and 6/10 for anxiety today.  Whitney Gonzalez reported that she had one panic attack since the last session, and 2 minor ones which she used relaxation techniques to halt.  Whitney Gonzalez reported that her anxiety and panic attacks have increased because she was temporarily staying with her boyfriend's family in [place], and they had an argument which made her consider separating again, triggered the panic attack, and led her to call her father to bring her home.  Whitney Gonzalez reported that this reinforced her original concerns about moving out of state since she would lose direct support, feel isolated, and potentially experience more mood fluctuations depending on stressors present.  Clinician inquired about whether Whitney Gonzalez and her boyfriend have remained engaged in couple's counseling due to ongoing relationship issues that have been present, and what changes she would like to see in following weeks to avoid separation.  [name] reported that they have a followup session together on Wednesday and she has made a list of expectations to discuss, including need to avoid "Mindi Junker, unimportant arguments", ensure socialization opportunities with friends, balance responsibilities fairly at home, and see more empathy from him regarding her unique mental health needs due to past trauma.  Clinician also inquired about  Whitney Gonzalez's self-care routine at this time to provide outlet for stress and reduce frequency of panic attacks.  Whitney Gonzalez reported that she has been  engaging in scripture reading and journaling each day, went out with friends last week, and stays close to family.  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  Clinician encouraged Whitney Gonzalez to continue making effort to prioritize personal needs and wellness, and will continue to monitor.   Plan: Follow up again virtually in 2 weeks.   Diagnosis: Generalized Anxiety Disorder, PTSD, and Bipolar I disorder   Shade Flood, LCSW, LCAS 08/16/20

## 2020-08-17 ENCOUNTER — Encounter: Payer: Self-pay | Admitting: Dermatology

## 2020-08-17 NOTE — Telephone Encounter (Signed)
Okay thank you

## 2020-08-18 ENCOUNTER — Ambulatory Visit: Payer: Medicaid Other | Admitting: Dermatology

## 2020-08-18 ENCOUNTER — Other Ambulatory Visit: Payer: Self-pay

## 2020-08-18 DIAGNOSIS — D485 Neoplasm of uncertain behavior of skin: Secondary | ICD-10-CM

## 2020-08-18 DIAGNOSIS — D239 Other benign neoplasm of skin, unspecified: Secondary | ICD-10-CM

## 2020-08-18 DIAGNOSIS — D2262 Melanocytic nevi of left upper limb, including shoulder: Secondary | ICD-10-CM | POA: Diagnosis not present

## 2020-08-18 DIAGNOSIS — Z86018 Personal history of other benign neoplasm: Secondary | ICD-10-CM

## 2020-08-18 HISTORY — DX: Personal history of other benign neoplasm: Z86.018

## 2020-08-18 NOTE — Progress Notes (Signed)
Follow-Up Visit   Subjective  Whitney Gonzalez is a 29 y.o. female who presents for the following: Procedure (Patient here today for excision of severe dysplastic nevus at left upper arm).  She also requests reevaluation and consider biopsy of a second lesion that we noted distal to the original biopsy site at her full skin exam.  We had discussed considering biopsy if the first biopsy came back severely atypical or worse.  The following portions of the chart were reviewed this encounter and updated as appropriate:  Tobacco  Allergies  Meds  Problems  Med Hx  Surg Hx  Fam Hx      Review of Systems:  No other skin or systemic complaints except as noted in HPI or Assessment and Plan.  Objective  Well appearing patient in no apparent distress; mood and affect are within normal limits.  A focused examination was performed including left arm. Relevant physical exam findings are noted in the Assessment and Plan.  Objective  Left Upper Arm Anterior: Healing pink biopsy site.   Objective  Left Upper Arm distal to excision: Medium brown thin papule with dark focus at 5 o'clock 0.6cm      Assessment & Plan  Dysplastic nevus Left Upper Arm Anterior  Skin excision - Left Upper Arm Anterior  Lesion length (cm):  0.5 Total excision diameter (cm):  1.5 Informed consent: discussed and consent obtained   Timeout: patient name, date of birth, surgical site, and procedure verified   Procedure prep:  Patient was prepped and draped in usual sterile fashion Prep type:  Chlorhexidine Anesthesia: the lesion was anesthetized in a standard fashion   Anesthetic:  1% lidocaine w/ epinephrine 1-100,000 buffered w/ 8.4% NaHCO3 (20 cc) Instrument used: #15 blade   Hemostasis achieved with: suture, pressure and electrodesiccation   Outcome: patient tolerated procedure well with no complications   Post-procedure details: wound care instructions given   Additional details:  Mupirocin and a  pressure dressing applied  20 CC lidocaine with epi used  Skin repair - Left Upper Arm Anterior Complexity:  Intermediate Final length (cm):  5.5 Informed consent: discussed and consent obtained   Timeout: patient name, date of birth, surgical site, and procedure verified   Procedure prep:  Patient was prepped and draped in usual sterile fashion Prep type:  Chlorhexidine Anesthesia: the lesion was anesthetized in a standard fashion   Anesthetic:  1% lidocaine w/ epinephrine 1-100,000 local infiltration Reason for type of repair: reduce tension to allow closure, reduce the risk of dehiscence, infection, and necrosis and enhance both functionality and cosmetic results   Undermining: edges undermined   Subcutaneous layers (deep stitches):  Suture size:  3-0 Suture type: Vicryl (polyglactin 910)   Stitches:  Buried vertical mattress Fine/surface layer approximation (top stitches):  Suture size:  4-0 Suture type comment:  Prolene Hemostasis achieved with: suture, pressure and electrodesiccation Outcome: patient tolerated procedure well with no complications   Post-procedure details: wound care instructions given   Additional details:  Mupirocin and a pressure dressing applied  Specimen 1 - Surgical pathology Differential Diagnosis: BX proven Severe Dysplastic Nevus Check Margins: No Healing pink biopsy site. [phone]  Neoplasm of uncertain behavior of skin Left Upper Arm distal to excision  Epidermal / dermal shaving  Lesion diameter (cm):  0.6 Informed consent: discussed and consent obtained   Timeout: patient name, date of birth, surgical site, and procedure verified   Patient was prepped and draped in usual sterile fashion: area prepped with  isopropyl alcohol. Anesthesia: the lesion was anesthetized in a standard fashion   Anesthetic:  1% lidocaine w/ epinephrine 1-100,000 buffered w/ 8.4% NaHCO3 Instrument used: flexible razor blade   Hemostasis achieved with: aluminum  chloride   Outcome: patient tolerated procedure well   Post-procedure details: wound care instructions given   Additional details:  Mupirocin and a bandage applied  Specimen 2 - Surgical pathology Differential Diagnosis: r/o Atypia Check Margins: No Medium brown thin papule with dark focus at 5 o'clock  Return in about 1 week (around 08/25/2020) for Suture Removal.  Graciella Belton, RMA, am acting as scribe for Forest Gleason, MD .   Documentation: I have reviewed the above documentation for accuracy and completeness, and I agree with the above.  Forest Gleason, MD

## 2020-08-18 NOTE — Patient Instructions (Signed)

## 2020-08-19 ENCOUNTER — Telehealth: Payer: Self-pay

## 2020-08-19 ENCOUNTER — Ambulatory Visit (INDEPENDENT_AMBULATORY_CARE_PROVIDER_SITE_OTHER): Payer: Medicaid Other | Admitting: Dermatology

## 2020-08-19 DIAGNOSIS — Z4889 Encounter for other specified surgical aftercare: Secondary | ICD-10-CM

## 2020-08-19 NOTE — Telephone Encounter (Signed)
Called patient to check on them following yesterday's surgery. No answer and VM full, Whitney Gonzalez

## 2020-08-19 NOTE — Progress Notes (Signed)
   Follow-Up Visit   Subjective  Whitney Gonzalez is a 29 y.o. female who presents for the following: Follow-up (Patient here today for post op visit. ).  Patient had a little bit of bleeding at excision site. Brought in for recheck. Mild tenderness but no significant pain. Doing well with tylenol and ibuprofen.  The following portions of the chart were reviewed this encounter and updated as appropriate:  Tobacco  Allergies  Meds  Problems  Med Hx  Surg Hx  Fam Hx      Review of Systems:  No other skin or systemic complaints except as noted in HPI or Assessment and Plan.  Objective  Well appearing patient in no apparent distress; mood and affect are within normal limits.  A focused examination was performed including left arm. Relevant physical exam findings are noted in the Assessment and Plan.  Objective  Left Upper Arm - Anterior: Surgical site clean with small amount of sanguinous discharge, no significant tenderness to palpation, no surrounding erythema, no induration    Assessment & Plan  Encounter for examination of surgical site Left Upper Arm - Anterior  No evidence of infection or hematoma Recommend stopping fish oil supplement until next week Avoid activities that pull at surgical site Will recommend adding pressure with ace bandage Continue wound care May add sling over the weekend to help add stability  Return as scheduled, for Suture Removal.  Graciella Belton, RMA, am acting as scribe for Forest Gleason, MD .  Documentation: I have reviewed the above documentation for accuracy and completeness, and I agree with the above.  Forest Gleason, MD

## 2020-08-19 NOTE — Patient Instructions (Addendum)
Avoid activities that pull at surgical site Will recommend adding pressure wrap with ace bandage until Monday Continue wound care Recommend adding sling over the weekend to help add stability and prevent additional movement at the surgical site  Wound Care Instructions  1. Cleanse wound gently with soap and water once a day then pat dry with clean gauze. Apply a thing coat of Petrolatum (petroleum jelly, "Vaseline") over the wound (unless you have an allergy to this). We recommend that you use a new, sterile tube of Vaseline. Do not pick or remove scabs. Do not remove the yellow or white "healing tissue" from the base of the wound.  2. Cover the wound with fresh, clean, nonstick gauze and secure with paper tape. You may use Band-Aids in place of gauze and tape if the would is small enough, but would recommend trimming much of the tape off as there is often too much. Sometimes Band-Aids can irritate the skin.  3. You should call the office for your biopsy report after 1 week if you have not already been contacted.  4. If you experience any problems, such as abnormal amounts of bleeding, swelling, significant bruising, significant pain, or evidence of infection, please call the office immediately.  5. FOR ADULT SURGERY PATIENTS: If you need something for pain relief you may take 1 extra strength Tylenol (acetaminophen) AND 2 Ibuprofen (200mg  each) together every 4 hours as needed for pain. (do not take these if you are allergic to them or if you have a reason you should not take them.) Typically, you may only need pain medication for 1 to 3 days.   Once pain improved discontinue ibuprofen first then Tylenol.

## 2020-08-20 ENCOUNTER — Encounter: Payer: Self-pay | Admitting: Dermatology

## 2020-08-24 ENCOUNTER — Telehealth: Payer: Self-pay

## 2020-08-24 NOTE — Progress Notes (Signed)
1. Skin (M), left upper arm anterior NO RESIDUAL DYSPLASTIC NEVUS, MARGINS FREE no residual dysplastic nevus.  No additional treatment needed.  2. Skin (M), left upper arm distal to excision DYSPLASTIC JUNCTIONAL NEVUS WITH MODERATE ATYPIA, CLOSE TO MARGIN  This is a MODERATELY ATYPICAL MOLE. On the spectrum from normal mole to melanoma skin cancer, this is in between the two. - We need to recheck this area sometime in the next 6 months to be sure there is no evidence of the atypical mole coming back. If there is any color coming back, we would recommend repeating the biopsy to be sure the cells look normal.  - People who have a history of atypical moles do have a slightly increased risk of developing melanoma somewhere on the body, so a yearly full body skin exam by a dermatologist is recommended.  - Monthly self skin checks and daily sun protection are also recommended.  - Please call if you notice a dark spot coming back where this biopsy was taken.  - Please also call if you notice any new or changing spots anywhere else on the body before your follow-up visit.   Tammy already reviewed the results with the patient.  We will further review this at follow-up tomorrow.

## 2020-08-24 NOTE — Telephone Encounter (Signed)
Spoke with patient today about concerns on the biopsy results that she seen in [place]. I assured her that the Severe was completely removed and that the second site was an area that she did not need to worry about and that we would be keeping a close eye on her. Also explained that if she did have any more concerns about these two sites that Dr. Laurence Ferrari would go over any concerns at the patients appointment tomorrow

## 2020-08-25 ENCOUNTER — Other Ambulatory Visit: Payer: Self-pay

## 2020-08-25 ENCOUNTER — Ambulatory Visit (INDEPENDENT_AMBULATORY_CARE_PROVIDER_SITE_OTHER): Payer: Medicaid Other | Admitting: Dermatology

## 2020-08-25 DIAGNOSIS — Z4802 Encounter for removal of sutures: Secondary | ICD-10-CM

## 2020-08-25 NOTE — Progress Notes (Signed)
   Follow-Up Visit   Subjective  Whitney Gonzalez is a 29 y.o. female who presents for the following: Follow-up (Patient here today for suture removal at left upper arm. ).  Biopsy proven severe dysplastic nevus at left upper arm biopsied 07/22/20, excised 08/18/20 with clear margins.   Biopsy one week ago at site distal to excision showed moderately atypical nevus.   The following portions of the chart were reviewed this encounter and updated as appropriate:  Tobacco  Allergies  Meds  Problems  Med Hx  Surg Hx  Fam Hx      Review of Systems:  No other skin or systemic complaints except as noted in HPI or Assessment and Plan.  Objective  Well appearing patient in no apparent distress; mood and affect are within normal limits.  A focused examination was performed including left arm. Relevant physical exam findings are noted in the Assessment and Plan.   Assessment & Plan    Encounter for Removal of Sutures - Incision site at the left upper arm anterior is clean, dry and intact - Wound cleansed, sutures removed, wound cleansed and steri strips applied.  - Discussed pathology results showing margins free, no residual dysplastic nevus   - Patient advised to keep steri-strips dry until they fall off. - Scars remodel for a full year. - Once steri-strips fall off, patient can apply over-the-counter silicone scar cream each night to help with scar remodeling if desired. - Patient advised to call with any concerns or if they notice any new or changing lesions.  Dysplastic nevus, moderate at left upper arm distal to excision site Reviewed this is a MODERATELY ATYPICAL MOLE. On the spectrum from normal mole to melanoma skin cancer, this is in between the two. - We will recheck the site at follow-up in December.   Return as scheduled, for TBSE.  Graciella Belton, RMA, am acting as scribe for Forest Gleason, MD .  Documentation: I have reviewed the above documentation for accuracy  and completeness, and I agree with the above.  Forest Gleason, MD

## 2020-08-30 ENCOUNTER — Ambulatory Visit (INDEPENDENT_AMBULATORY_CARE_PROVIDER_SITE_OTHER): Payer: Medicaid Other | Admitting: Licensed Clinical Social Worker

## 2020-08-30 ENCOUNTER — Encounter: Payer: Self-pay | Admitting: Dermatology

## 2020-08-30 ENCOUNTER — Other Ambulatory Visit: Payer: Self-pay

## 2020-08-30 DIAGNOSIS — F411 Generalized anxiety disorder: Secondary | ICD-10-CM

## 2020-08-30 DIAGNOSIS — F319 Bipolar disorder, unspecified: Secondary | ICD-10-CM

## 2020-08-30 DIAGNOSIS — F4310 Post-traumatic stress disorder, unspecified: Secondary | ICD-10-CM

## 2020-08-30 NOTE — Progress Notes (Signed)
Virtual Visit via Telephone Note   I connected with Whitney "Whitney Gonzalez" Flink on 08/30/20 at 1:00pm by telephone and verified that I am speaking with the correct person using two identifiers.   I discussed the limitations, risks, security and privacy concerns of performing an evaluation and management service by telephone and the availability of in person appointments. I also discussed with the patient that there may be a patient responsible charge related to this service. The patient expressed understanding and agreed to proceed.   I discussed the assessment and treatment plan with the patient. The patient was provided an opportunity to ask questions and all were answered. The patient agreed with the plan and demonstrated an understanding of the instructions.   The patient was advised to call back or seek an in-person evaluation if the symptoms worsen or if the condition fails to improve as anticipated.   I provided 30 minutes of non-face-to-face time during this encounter.     Shade Flood, LCSW, LCAS _____________________________ THERAPIST PROGRESS NOTE   Session Time: 1:00pm - 1:30pm  Location: Patient: Patient Home Provider: [facility] [place] [facility]     Participation Level: Active    Behavioral Response: Alert, anxious mood   Type of Therapy:  Individual Therapy   Treatment Goals addressed: Medication compliance; Depression and anxiety management; Employment/Maintaining work life balance    Interventions: CBT, problem solving    Summary: Whitney Gonzalez is a 29 year old Caucasian female that prefers to go by [employer]" and presented for therapy session today via telephone with diagnoses of Generalized Anxiety Disorder, PTSD, and Bipolar I disorder.      Suicidal/Homicidal: None, without plan or intent.     Therapist Response: Clinician spoke with Whitney Gonzalez today via telephone, as she continues to experience issues with virtual appointments due to unreliable internet access.  Clinician  assessed for safety, sobriety, and medication compliance. Whitney Gonzalez spoke in a manner that was alert, oriented x5, with no evidence or self-report of SI/HI or A/V H.  Whitney Gonzalez reported that she continues taking medication as prescribed and denied any use of alcohol or illicit substances.  Clinician inquired about Whitney Gonzalez's emotional ratings today, as well as any significant changes in thoughts, feelings, or behavior since previous check-in. Whitney Gonzalez reported scores of 1/10 for depression and 3/10 for anxiety today.  Whitney Gonzalez reported that she has not experienced any panic attacks since the last session, but has been feeling more irritable towards her father, as she has begun working at her new job and is more reliant upon both parents to assist, but she finds that he has a different parenting style and seems to forget or ignore her childcare preferences at times.  Clinician explored strategies with Whitney Gonzalez for assertively expressing her needs to her father so that they can resolve differences in approach and support each other during this transition, including making a list of things she would like for him to do while she is on shift so that instructions are more clear.  Clinician also brought up Whitney Gonzalez's current work/life balance at this time, emphasizing importance of setting aside time in schedule for restful and rewarding activities as outlet for any work related stress that may arise and potentially increase irritability.  Interventions were effective, as evidenced by Whitney Gonzalez reporting that she would plan to speak with her father again soon about ongoing communication issues and make comprehensive list to go over with him beforehand, stating "I know this will take some getting used to for both of [place]".  Whitney Gonzalez also  reported that she would look at work schedule this week and prioritize self-care activities such as going on walks with her son, getting chiropractic adjustments to reduce scoliosis related pain, and go to the gym more  often.  She reported that she did go to the zoo with her family over the weekend, which was a positive experience for her and boyfriend too as they continue to work on strengthening relationship.  Clinician will continue to monitor.   Plan: Follow up again virtually in 2 weeks.   Diagnosis: Generalized Anxiety Disorder, PTSD, and Bipolar I disorder   Shade Flood, LCSW, LCAS 08/30/20

## 2020-09-01 ENCOUNTER — Ambulatory Visit: Payer: Medicaid Other | Admitting: Dermatology

## 2020-09-02 ENCOUNTER — Other Ambulatory Visit: Payer: Self-pay

## 2020-09-02 ENCOUNTER — Encounter (HOSPITAL_COMMUNITY): Payer: Self-pay | Admitting: Psychiatry

## 2020-09-02 ENCOUNTER — Telehealth (INDEPENDENT_AMBULATORY_CARE_PROVIDER_SITE_OTHER): Payer: Medicaid Other | Admitting: Psychiatry

## 2020-09-02 ENCOUNTER — Telehealth: Payer: Self-pay

## 2020-09-02 DIAGNOSIS — F319 Bipolar disorder, unspecified: Secondary | ICD-10-CM

## 2020-09-02 DIAGNOSIS — F411 Generalized anxiety disorder: Secondary | ICD-10-CM

## 2020-09-02 DIAGNOSIS — F4310 Post-traumatic stress disorder, unspecified: Secondary | ICD-10-CM

## 2020-09-02 DIAGNOSIS — F449 Dissociative and conversion disorder, unspecified: Secondary | ICD-10-CM | POA: Diagnosis not present

## 2020-09-02 MED ORDER — BREXPIPRAZOLE 1 MG PO TABS: 1.0000 mg | ORAL_TABLET | Freq: Every day | ORAL | 1 refills | Status: DC

## 2020-09-02 NOTE — Telephone Encounter (Signed)
Pt called with a concern for a lump that she felt on her neck today, it was not there yesterday. Pt states it is on the R side of her neck. It is not painful or tender to touch. She says that the lump moves around when she touches it.   Pt is on her way to work now. She will keep her phone on but OK to leave detailed message if we get vm.
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
## 2020-09-02 NOTE — Telephone Encounter (Signed)
Spoke with patient. She will call office back if she feels it needs to be looked at sooner than appt on 10/12/20.

## 2020-09-02 NOTE — Telephone Encounter (Signed)
Unfortunately without seeing this I can't comment on what the spot might be. If she is concerned about it, I am happy to see her in clinic to check the area. I can also check at her next scheduled appointment if it's not changing or painful.   MAs please call. Thank you!

## 2020-09-09 ENCOUNTER — Ambulatory Visit: Payer: Medicaid Other | Admitting: Obstetrics & Gynecology

## 2020-09-14 ENCOUNTER — Ambulatory Visit
Admission: RE | Admit: 2020-09-14 | Discharge: 2020-09-14 | Disposition: A | Discharge status: Home / Self Care | Payer: Medicaid Other | Source: Ambulatory Visit | Attending: Family Medicine | Admitting: Family Medicine

## 2020-09-14 ENCOUNTER — Other Ambulatory Visit: Payer: Self-pay | Admitting: Family Medicine

## 2020-09-14 DIAGNOSIS — R221 Localized swelling, mass and lump, neck: Secondary | ICD-10-CM

## 2020-09-28 ENCOUNTER — Other Ambulatory Visit: Payer: Self-pay | Admitting: Dermatology

## 2020-09-28 DIAGNOSIS — L709 Acne, unspecified: Secondary | ICD-10-CM

## 2020-09-29 ENCOUNTER — Ambulatory Visit (HOSPITAL_COMMUNITY): Payer: Medicaid Other | Admitting: Licensed Clinical Social Worker

## 2020-09-30 ENCOUNTER — Ambulatory Visit: Payer: Medicaid Other | Admitting: Obstetrics & Gynecology

## 2020-10-06 ENCOUNTER — Other Ambulatory Visit: Payer: Self-pay

## 2020-10-06 ENCOUNTER — Ambulatory Visit (INDEPENDENT_AMBULATORY_CARE_PROVIDER_SITE_OTHER): Payer: Medicaid Other | Admitting: Licensed Clinical Social Worker

## 2020-10-06 DIAGNOSIS — F4310 Post-traumatic stress disorder, unspecified: Secondary | ICD-10-CM | POA: Diagnosis not present

## 2020-10-06 DIAGNOSIS — F411 Generalized anxiety disorder: Secondary | ICD-10-CM

## 2020-10-06 DIAGNOSIS — F319 Bipolar disorder, unspecified: Secondary | ICD-10-CM

## 2020-10-06 NOTE — Progress Notes (Signed)
Virtual Visit via Telephone Note   I connected with Whitney "Whitney Gonzalez" [place] on 10/06/20 at 10:00am by telephone and verified that I am speaking with the correct person using two identifiers.   I discussed the limitations, risks, security and privacy concerns of performing an evaluation and management service by telephone and the availability of in person appointments. I also discussed with the patient that there may be a patient responsible charge related to this service. The patient expressed understanding and agreed to proceed.   I discussed the assessment and treatment plan with the patient. The patient was provided an opportunity to ask questions and all were answered. The patient agreed with the plan and demonstrated an understanding of the instructions.   The patient was advised to call back or seek an in-person evaluation if the symptoms worsen or if the condition fails to improve as anticipated.   I provided 30 minutes of non-face-to-face time during this encounter.     Shade Flood, LCSW, LCAS _____________________________ THERAPIST PROGRESS NOTE   Session Time: 10:00am - 10:30am   Location: Patient: Patient Home Provider: [facility] [place] Office     Participation Level: Active    Behavioral Response: Alert, depressed mood   Type of Therapy:  Individual Therapy   Treatment Goals addressed: Medication compliance; Depression and anxiety management; Employment/Maintaining work life balance; Achieving balance in relationship    Interventions: CBT, problem solving    Summary: Whitney Gonzalez is a 29 year old Caucasian female that prefers to go by "Whitney Gonzalez" and presented for therapy session today via telephone with diagnoses of Generalized Anxiety Disorder, PTSD, and Bipolar I disorder.      Suicidal/Homicidal: None, without plan or intent.     Therapist Response: Clinician spoke with Whitney Gonzalez today via telephone, as she continues to experience issues with virtual sessions due to  unreliable internet access.  Clinician assessed for safety, sobriety, and medication compliance. Whitney Gonzalez spoke in a manner that was alert, oriented x5, with no evidence or self-report of SI/HI or A/V H.  Whitney Gonzalez reported ongoing compliance with medication and denied any use of alcohol or illicit substances.  Clinician inquired about Whitney Gonzalez's current emotional ratings, as well as any significant changes in thoughts, feelings, or behavior since last check-in. Whitney Gonzalez reported scores of 4/10 for depression and 1/10 for anxiety today, denying any reoccurrence of panic attacks since the previous check-in.  Clinician inquired about Whitney Gonzalez's successes and struggles since last session.  Whitney Gonzalez reported that she has been working at her new job, seems to enjoy it so far, and is trying to set aside time for self-care to ensure balance each day.  Whitney Gonzalez reported that her relationship with boyfriend remains an issue, and although they are still going to couples therapy once per month, she finds herself more frustrated lately.  Clinician inquired about Whitney Gonzalez's present concerns with state of relationship, what she has already tried to improve the situation, and brainstormed additional strategies for improving understanding and support within relationship to reduce related stress.  Whitney Gonzalez reported that she feels like her partner should get a better paying job to improve financial security, and they had originally agreed that he would be the primary 'breadwinner' of the family, but they cannot come to agreement upon this, and she frequently resorts to all or none thinking when she doesn't get her way.  Clinician discussed importance of expressing one's feelings in the relationship while also seeking compromise when possible and respecting other individuals' personal boundaries.  Clinician also encouraged Whitney Gonzalez to use  ongoing couples counseling sessions as safe space to address ongoing concerns with couples counselor as mediator to avoid  unnecessary arguments and increase empathy.  Interventions were effective, as evidenced by Whitney Gonzalez reporting that this session was helpful for alleviating some of her frustration, giving her ideas on how to resolve their communication skills, and motivating her to speak with her boyfriend soon about how they can work together to maintain the relationship.  Whitney Gonzalez reported that based upon juggling appointments, work schedule, and increased stability since treatment began, she feels comfortable meeting once per month now.  Clinician will continue to monitor.   Plan: Follow up again virtually in 1 month.   Diagnosis: Generalized Anxiety Disorder, PTSD, and Bipolar I disorder   Shade Flood, LCSW, LCAS 10/06/20

## 2020-10-12 ENCOUNTER — Ambulatory Visit: Payer: Medicaid Other | Admitting: Dermatology

## 2020-10-12 ENCOUNTER — Other Ambulatory Visit: Payer: Self-pay

## 2020-10-12 ENCOUNTER — Encounter: Payer: Self-pay | Admitting: Dermatology

## 2020-10-12 VITALS — BP 117/81 | HR 90

## 2020-10-12 DIAGNOSIS — L700 Acne vulgaris: Secondary | ICD-10-CM

## 2020-10-12 DIAGNOSIS — Z86018 Personal history of other benign neoplasm: Secondary | ICD-10-CM

## 2020-10-12 DIAGNOSIS — R591 Generalized enlarged lymph nodes: Secondary | ICD-10-CM

## 2020-10-12 MED ORDER — ADAPALENE 0.3 % EX GEL: CUTANEOUS | 3 refills | Status: DC

## 2020-10-12 NOTE — Patient Instructions (Signed)
Topical retinoid medications like tretinoin/Retin-A, adapalene/Differin, tazarotene/Fabior, and Epiduo/Epiduo Forte can cause dryness and irritation when first started. Only apply a pea-sized amount to the entire affected area. Avoid applying it around the eyes, edges of mouth and creases at the nose. If you experience irritation, use a good moisturizer first and/or apply the medicine less often. If you are doing well with the medicine, you can increase how often you use it until you are applying every night. Be careful with sun protection while using this medication as it can make you sensitive to the sun. This medicine should not be used by pregnant women.   

## 2020-10-12 NOTE — Progress Notes (Signed)
Follow-Up Visit   Subjective  Whitney Gonzalez is a 29 y.o. female who presents for the following: Acne (patient currently using Spironolactone 100mg  po QHS but patient D/C Tretinoin 0.025% due to irritation. She changed her skin care regiment which has helped some, but she continues to break out).   Patient also c/o lump on the right neck that has been there for more than one month and seems to be growing in size. Lesion was evaluated by her PCP and U/S studies were performed which showed a lymph node. PCP recommended that she RTC if lymphnode doesn't resolve or grows larger.   The following portions of the chart were reviewed this encounter and updated as appropriate:  Tobacco  Allergies  Meds  Problems  Med Hx  Surg Hx  Fam Hx     Review of Systems:  No other skin or systemic complaints except as noted in HPI or Assessment and Plan.  Objective  Well appearing patient in no apparent distress; mood and affect are within normal limits.  A focused examination was performed including the face, chest, and back . Relevant physical exam findings are noted in the Assessment and Plan.  Objective  Face, chest, and back: Many inflammatory papules along jaw, some excoriated 1+ open comedones at face  Chest and back with trace open comedones.    Objective  R neck: Nodule   Objective  L upper arm: Hypertrophic scar other wise clear.  Assessment & Plan  Acne vulgaris Face, chest, and back  Chronic condition, no cure, only control. Not currently at goal.    Increase Spironolactone pending normal potassium (increase to 50 mg qam, 100 mg qhs for 1 week, recheck BP in two weeks, if BP doing well, and if feeling well with no lightheadedness, increase to 100 mg BID). Patient to check BP at home and call and report measurement to our office in 2 weeks.   Recheck potassium 3 weeks after increasing dose.  D/C Tretinoin  Start Adapalene 0.3% gel QHS. If too expensive will switch  back to Tretinoin 0.025% cream QHS. Topical retinoid medications like tretinoin/Retin-A, adapalene/Differin, tazarotene/Fabior, and Epiduo/Epiduo Forte can cause dryness and irritation when first started. Only apply a pea-sized amount to the entire affected area. Avoid applying it around the eyes, edges of mouth and creases at the nose. If you experience irritation, use a good moisturizer first and/or apply the medicine less often. If you are doing well with the medicine, you can increase how often you use it until you are applying every night. Be careful with sun protection while using this medication as it can make you sensitive to the sun. This medicine should not be used by pregnant women.    Restart Clindamycin gel QAM (patient already has). Recommend Panoxyl wash daily, CLN acne wash, OR Hypochlorous wound spray  daily to prevent antibacterial resistance from Clindamycin gel.      Potassium - Face, chest, and back  Adapalene (DIFFERIN) 0.3 % gel - Face, chest, and back  Lymphadenopathy R neck  Continue care with PCP and return to their office for any changes.   History of dysplastic nevus L upper arm  Site of severely dysplastic nevus - clear to visual exam and palpation   Discussed ILK injection for hypertrophic scar, but patient declines treatment today.   Return for appointment as scheduled will re-evaluate at [facility] in December.  Luther Redo, CMA, am acting as scribe for Forest Gleason, MD .  Documentation: I have  reviewed the above documentation for accuracy and completeness, and I agree with the above.  Forest Gleason, MD

## 2020-10-13 LAB — POTASSIUM: Potassium: 4.4 mmol/L (ref 3.5–5.2)

## 2020-10-13 NOTE — Progress Notes (Signed)
Potassium normal OK to increase spironolactone to 100 mg qhs and 50 mg qam. Will recheck blood pressure in 2 weeks and plan to increase to 100 mg BID if blood pressure ok at that time and no symptoms.  MAs please call and set up nurse visit. Thank you!

## 2020-10-25 ENCOUNTER — Other Ambulatory Visit: Payer: Self-pay | Admitting: Obstetrics & Gynecology

## 2020-10-25 ENCOUNTER — Encounter: Payer: Self-pay | Admitting: Dermatology

## 2020-10-25 ENCOUNTER — Telehealth: Payer: Self-pay

## 2020-10-25 NOTE — Telephone Encounter (Signed)
Called patient on 10/13/20 and today to review results. No answer and VM is full, JS

## 2020-10-25 NOTE — Telephone Encounter (Signed)
-----   Message from [place], MD sent at 10/13/2020 10:39 AM EST ----- Potassium normal OK to increase spironolactone to 100 mg qhs and 50 mg qam. Will recheck blood pressure in 2 weeks and plan to increase to 100 mg BID if blood pressure ok at that time and no symptoms.  MAs please call and set up nurse visit. Thank you!

## 2020-11-01 ENCOUNTER — Telehealth: Payer: Self-pay | Admitting: Family Medicine

## 2020-11-01 MED ORDER — SLYND 4 MG PO TABS
1.0000 | ORAL_TABLET | Freq: Every day | ORAL | 2 refills | Status: DC
Start: 2020-11-01 — End: 2021-04-12

## 2020-11-01 NOTE — Telephone Encounter (Signed)
Refill on birth control, slynd  [facility]

## 2020-11-01 NOTE — Telephone Encounter (Signed)
Called patient in regards to [facility] prescription.   Reviewed with patient that she missed her last follow up. She says she forgot.   Patient reports she forgot her last ppt. She had cycles in October and is late for her November cycle. Her cycles are irregular. She took a pregnancy test this week and is negative. She takes her OCP every night about the same time. She says she feels better on this OCP as compared to the last.   She reports the lymph node in her neck is still there, she is not noted any in the groin area since. She feels like the ones in her right axilla is swollen also. She has seen her PCP for the Lymph nodes.   Prescription sent to pharmacy.

## 2020-11-02 ENCOUNTER — Other Ambulatory Visit: Payer: Self-pay

## 2020-11-02 ENCOUNTER — Ambulatory Visit (INDEPENDENT_AMBULATORY_CARE_PROVIDER_SITE_OTHER): Payer: Medicaid Other | Admitting: Psychiatry

## 2020-11-02 ENCOUNTER — Encounter (HOSPITAL_COMMUNITY): Payer: Self-pay | Admitting: Psychiatry

## 2020-11-02 VITALS — Wt 152.0 lb

## 2020-11-02 DIAGNOSIS — F419 Anxiety disorder, unspecified: Secondary | ICD-10-CM | POA: Diagnosis not present

## 2020-11-02 DIAGNOSIS — F449 Dissociative and conversion disorder, unspecified: Secondary | ICD-10-CM

## 2020-11-02 DIAGNOSIS — F319 Bipolar disorder, unspecified: Secondary | ICD-10-CM | POA: Diagnosis not present

## 2020-11-02 DIAGNOSIS — F4310 Post-traumatic stress disorder, unspecified: Secondary | ICD-10-CM

## 2020-11-02 MED ORDER — BREXPIPRAZOLE 1 MG PO TABS: 1.0000 mg | ORAL_TABLET | Freq: Every day | ORAL | 2 refills | Status: DC

## 2020-11-02 MED ORDER — CITALOPRAM HYDROBROMIDE 20 MG PO TABS: 30.0000 mg | ORAL_TABLET | Freq: Every day | ORAL | 2 refills | Status: DC
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
## 2020-11-03 ENCOUNTER — Telehealth: Payer: Self-pay

## 2020-11-03 ENCOUNTER — Other Ambulatory Visit: Payer: Self-pay

## 2020-11-03 DIAGNOSIS — L709 Acne, unspecified: Secondary | ICD-10-CM

## 2020-11-03 MED ORDER — SPIRONOLACTONE 100 MG PO TABS: ORAL_TABLET | ORAL | 0 refills | Status: DC

## 2020-11-03 NOTE — Telephone Encounter (Signed)
Patient advised labs ok, increase spironolactone to 100mg  qhs and 50mg  qam. Refill sent in for patient. Patient would like to do a virtual for BP check in 2 weeks, she has a cuff at home, JS

## 2020-11-03 NOTE — Telephone Encounter (Signed)
-----   Message from [place], MD sent at 10/13/2020 10:39 AM EST ----- Potassium normal OK to increase spironolactone to 100 mg qhs and 50 mg qam. Will recheck blood pressure in 2 weeks and plan to increase to 100 mg BID if blood pressure ok at that time and no symptoms.  MAs please call and set up nurse visit. Thank you!

## 2020-11-04 ENCOUNTER — Encounter: Payer: Medicaid Other | Admitting: Dermatology

## 2020-11-05 ENCOUNTER — Other Ambulatory Visit: Payer: Self-pay

## 2020-11-05 ENCOUNTER — Ambulatory Visit (INDEPENDENT_AMBULATORY_CARE_PROVIDER_SITE_OTHER): Payer: Medicaid Other | Admitting: Licensed Clinical Social Worker

## 2020-11-05 DIAGNOSIS — F319 Bipolar disorder, unspecified: Secondary | ICD-10-CM | POA: Diagnosis not present

## 2020-11-05 DIAGNOSIS — F411 Generalized anxiety disorder: Secondary | ICD-10-CM

## 2020-11-05 DIAGNOSIS — F4310 Post-traumatic stress disorder, unspecified: Secondary | ICD-10-CM

## 2020-11-05 NOTE — Progress Notes (Signed)
Virtual Visit via Telephone Note   I connected with Whitney Gonzalez on 11/05/20 at 10:00am by telephone and verified that I am speaking with the correct person using two identifiers.   I discussed the limitations, risks, security and privacy concerns of performing an evaluation and management service by telephone and the availability of in person appointments. I also discussed with the patient that there may be a patient responsible charge related to this service. The patient expressed understanding and agreed to proceed.   I discussed the assessment and treatment plan with the patient. The patient was provided an opportunity to ask questions and all were answered. The patient agreed with the plan and demonstrated an understanding of the instructions.   The patient was advised to call back or seek an in-person evaluation if the symptoms worsen or if the condition fails to improve as anticipated.   I provided 30 minutes of non-face-to-face time during this encounter.     Shade Flood, LCSW, LCAS _____________________________ THERAPIST PROGRESS NOTE   Session Time: 10:00am - 10:30am  Location: Patient: Patient Home Provider: [facility]     Participation Level: Active    Behavioral Response: Alert, euthymic mood   Type of Therapy:  Individual Therapy   Treatment Goals addressed: Medication compliance; Depression and anxiety management; Employment/Maintaining work life balance; Achieving balance in relationship   Interventions: CBT, treatment planning   Summary: Whitney Gonzalez is a 29 year old Caucasian female that prefers to go by "Whitney Gonzalez" and presented for therapy session today via telephone with diagnoses of Generalized Anxiety Disorder, PTSD, and Bipolar I disorder.      Suicidal/Homicidal: None, without plan or intent.     Therapist Response: Clinician spoke with Whitney Gonzalez today via telephone, as she continues to experience issues with virtual appointments due to  unreliable internet access.  Clinician assessed for safety, sobriety, and medication compliance. Whitney Gonzalez spoke in a manner that was alert, oriented x5, with no evidence or self-report of SI/HI or A/V H.  Whitney Gonzalez reported that she continues taking medication as prescribed and denied any use of alcohol or illicit substances.  Clinician inquired about Whitney Gonzalez's emotional ratings today, as well as any significant changes in thoughts, feelings, or behavior since previous check-in. Whitney Gonzalez reported scores of 0/10 for depression and 1/10 for anxiety today, denying any reoccurrence of panic attacks.  Clinician inquired about Whitney Gonzalez's recent struggles and successes since previous session.  Whitney Gonzalez reported that she has maintained work schedule with new job, and greatly enjoys it, stating "I feel supported and cared for by everyone and love what I do".  She reported that her relationship with boyfriend is positive, and they continue attending couple's counseling, stating "We still have disagreements, but we can talk it out easier and I feel like my opinions have more value now".  She reported that she is setting aside time for self-care outside of work to read, go for walks, and watch entertaining videos.  She denied having any issues that needed to be addressed today in session, so clinician proposed revisiting treatment plan to make revisions as needed due to progress seen.  Whitney Gonzalez was agreeable to this, and collaborated to make updates as follows:  Meet with clinician virtually x1 per month for virtual therapy to identify where progress has been made, and any needs that need to be addressed; Meet with psychiatrist virtually once every 3 months to check on efficacy of medication and any changes needed for dose/regimen if necessary; Taking medication daily as prescribed  for maximum benefit in addressing symptom reduction and improvement in daily functioning; Reduce anxiety from average of 2/10 in severity down to 0/10 in next 90 days by  engaging in relaxation and grounding techniques 2-3 times per day such as mindful breathing, progressive muscle relaxation, and/or guided imagery; Reduce depression from average of 1/10 in severity down to 0/10 in next 90 days by engaging in 1-2 hours of positive activities daily, such as reading, writing, socialization with friends, and/or getting outside for fresh air; Exercise 1 day per week for 1 hour depending upon energy level/exertion from work schedule to improve mental and physical wellbeing; Commit to outreaching positive friends 2-3x per week to increase self-awareness of thoughts, feelings, behavioral changes during treatment, and provide safe outlet for expressing oneself without judgment; 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 in order to provide satisfaction, financial security, and reinforce personal strengths; Work with partner to achieve balance in relationship and improve communication/understanding by setting aside at least 30 minutes each day to spend quality time together in addition to engaging in couples therapy once per month for outside counseling/support; Work closely with boyfriend to locate a suitable home, take steps to secure mortgage, and improve quality of living within next 6 months; Seeking voluntary hospitalization if symptoms of SI/HI or A/V H appear, with assistance from fianc, family members, clinician or psychiatrist.  Whitney Gonzalez reported that therapy continues to be beneficial, stating "I feel like having somebody to talk to that is unbiased and validates my feelings helps a lot.  Its given me more confidence in my decision making and I feel better mentally, more grounded".  Clinician will continue to monitor.   Plan: Follow up again virtually in 1 month.   Diagnosis: Generalized Anxiety Disorder, PTSD, and Bipolar I disorder   Shade Flood, LCSW, LCAS 11/05/20

## 2020-11-14 IMAGING — MR MR HEAD WO/W CM
7 of 13 series · 20 of 48 positions shown · IV contrast (7ml gadavist)
Comparison: Prior head CT from 10/23/2019.

CLINICAL DATA: Initial evaluation for acute numbness, paresthesias.
Question multiple sclerosis.

EXAM:
MRI HEAD WITHOUT AND WITH CONTRAST
TECHNIQUE: Multiplanar, multiecho pulse sequences of the brain and surrounding
structures were obtained without and with intravenous contrast.
CONTRAST:  7mL GADAVIST GADOBUTROL 1 MMOL/ML IV SOLN

[Series 2: DWI · axial · 3.0mm · 0.94mm/px · z∈[-139,-5]mm · 4 of 96 slices shown (1 of 2)]
[im 1/96]
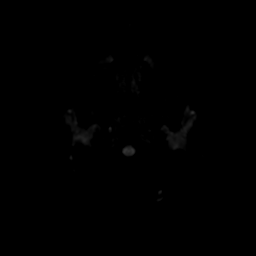
[im 32/96]
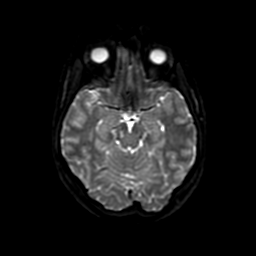
[im 64/96]
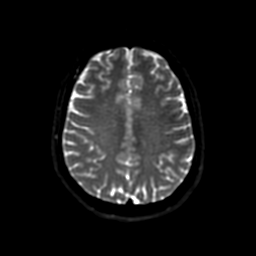
[im 96/96]
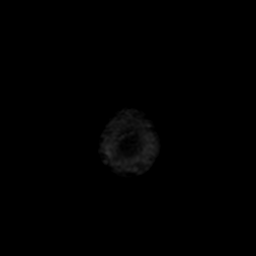

[Series 3: FLAIR · sagittal · 5.0mm · 0.21mm/px · 1 of 25 slices shown (1 of 3)]
[im 1/25]
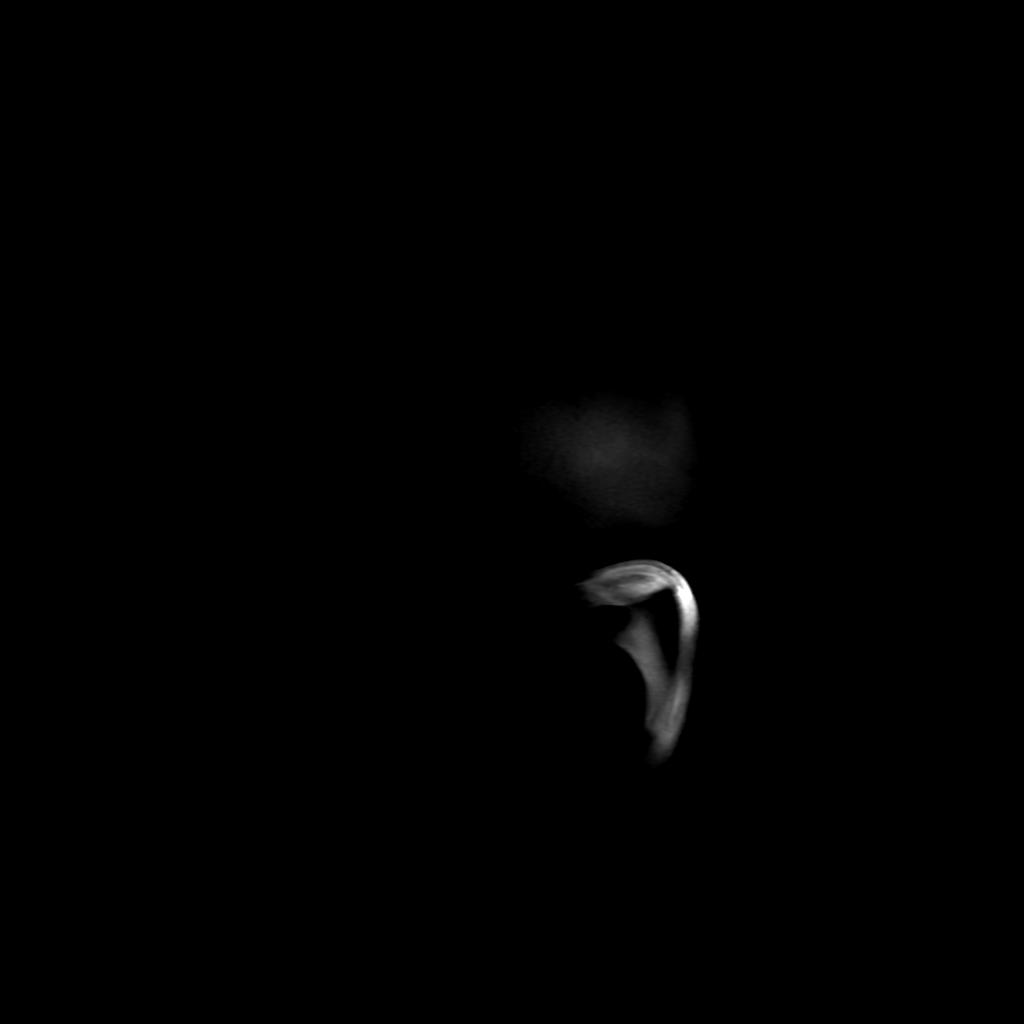

[Series 4: DWI · coronal · 4.0mm · 0.94mm/px · 3 of 70 slices shown (2 of 2)]
[im 1/70]
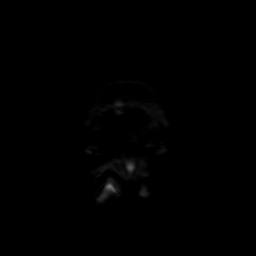
[im 35/70]
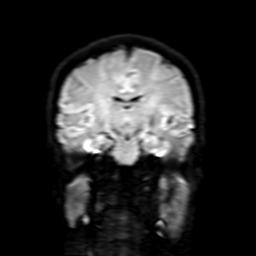
[im 70/70]
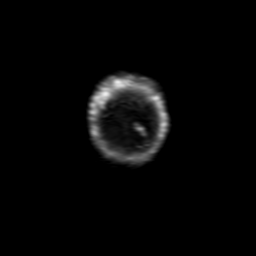

[Series 6: FLAIR · axial · 3.0mm · 0.41mm/px · 1 of 26 slices shown (2 of 3)]
[im 1/26]
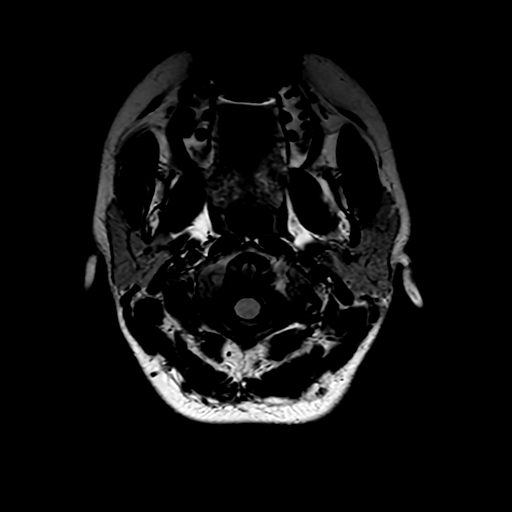

[Series 10: FLAIR · sagittal · 1.6mm · 0.49mm/px · 8 of 216 slices shown (3 of 3)]
[im 1/216]
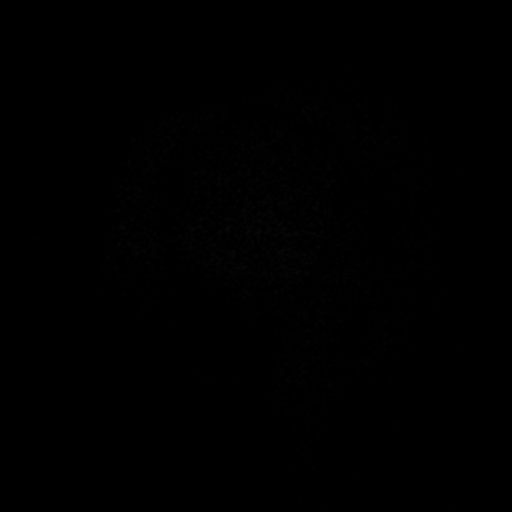
[im 31/216]
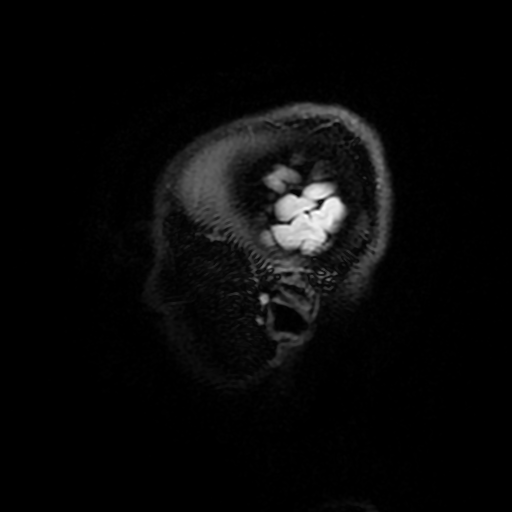
[im 62/216]
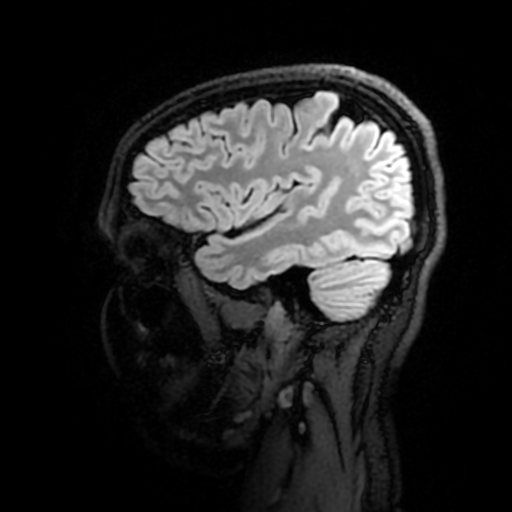
[im 93/216]
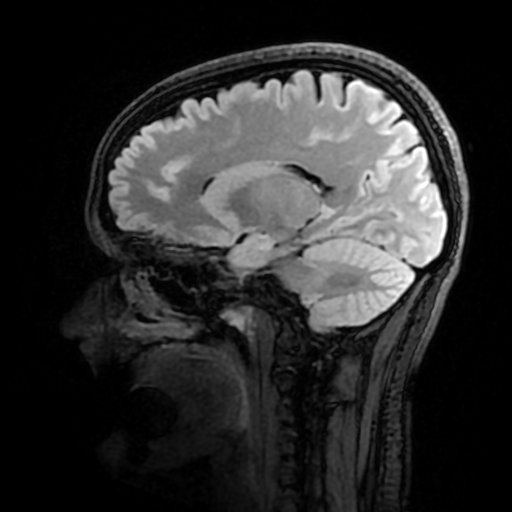
[im 123/216]
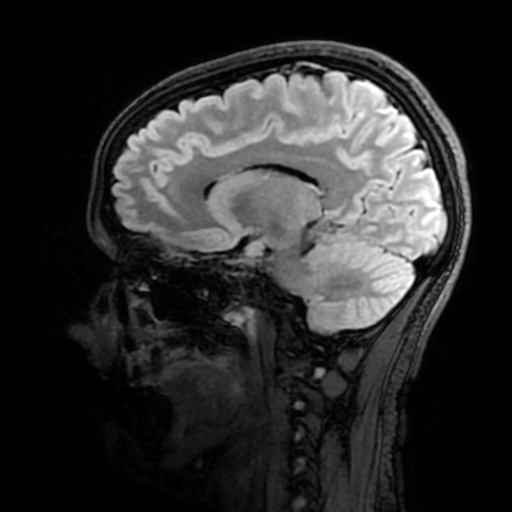
[im 154/216]
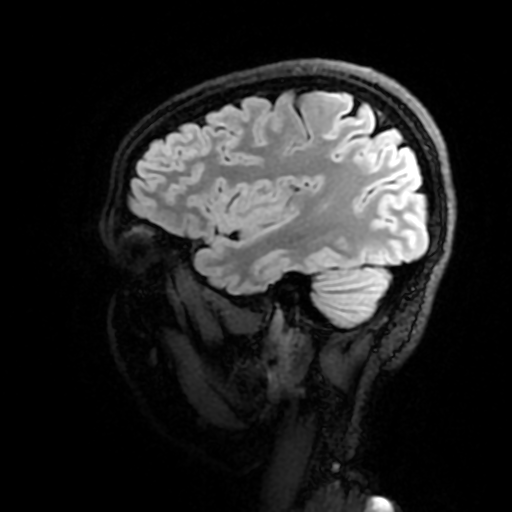
[im 185/216]
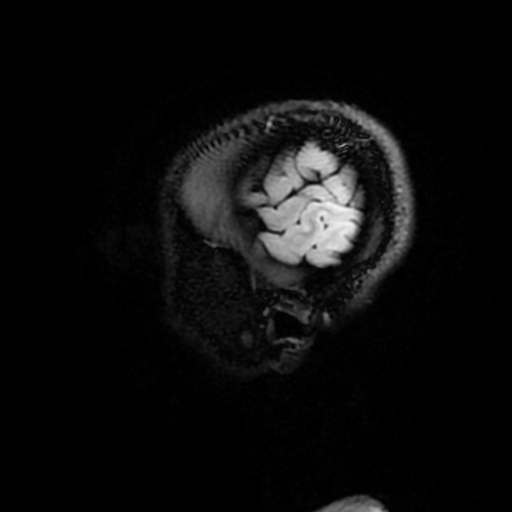
[im 216/216]
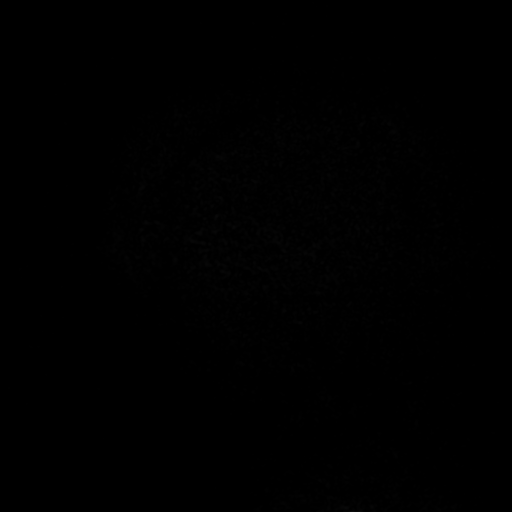

[Series 250: ADC · axial · 3.0mm · 0.94mm/px · z∈[-139,-5]mm · 2 of 48 slices shown (1 of 2)]
[im 1/48]
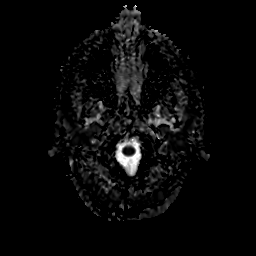
[im 48/48]
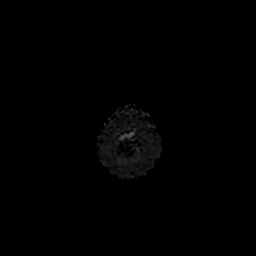

[Series 450: ADC · coronal · 4.0mm · 0.94mm/px · 1 of 35 slices shown (2 of 2)]
[im 1/35]
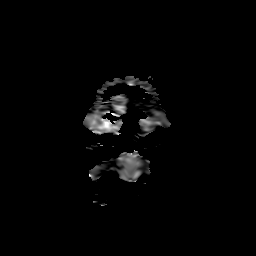

[20 of 48 positions shown; findings below may reference images not displayed]

FINDINGS: Brain: Cerebral volume within normal limits for patient age. No
focal parenchymal signal abnormality identified.

No abnormal foci of restricted diffusion to suggest acute or
subacute ischemia. Gray-white matter differentiation well
maintained. No encephalomalacia to suggest chronic infarction. No
foci of susceptibility artifact to suggest acute or chronic
intracranial hemorrhage.

No mass lesion, midline shift or mass effect. No hydrocephalus. No
extra-axial fluid collection. Major dural sinuses are grossly
patent.

Pituitary gland and suprasellar region are normal. Midline
structures intact and normal.

No abnormal enhancement.

Vascular: Major intracranial vascular flow voids well maintained and
normal in appearance.

Skull and upper cervical spine: Craniocervical junction normal.
Visualized upper cervical spine within normal limits. Bone marrow
signal intensity normal. No scalp soft tissue abnormality.

Sinuses/Orbits: Globes and orbital soft tissues within normal
limits.

Paranasal sinuses are clear. No mastoid effusion. Inner ear
structures normal.

Other: None.
IMPRESSION: Normal brain MRI. No acute intracranial abnormality identified. No
evidence for demyelinating disease.

## 2020-11-17 NOTE — Telephone Encounter (Signed)
-----   Message from Graciella Belton, [place] sent at 11/04/2020  3:04 PM EST ----- Regarding: Blood Pressure Check Patient will check blood pressure at home. Call to confirm.

## 2020-11-17 NOTE — Telephone Encounter (Signed)
Called patient to have her check blood pressure, she was at work and will check it when she gets home and let us know results, JS

## 2020-11-24 ENCOUNTER — Other Ambulatory Visit: Payer: Self-pay

## 2020-11-24 ENCOUNTER — Ambulatory Visit (INDEPENDENT_AMBULATORY_CARE_PROVIDER_SITE_OTHER): Payer: Medicaid Other | Admitting: Licensed Clinical Social Worker

## 2020-11-24 DIAGNOSIS — F411 Generalized anxiety disorder: Secondary | ICD-10-CM | POA: Diagnosis not present

## 2020-11-24 DIAGNOSIS — F4310 Post-traumatic stress disorder, unspecified: Secondary | ICD-10-CM

## 2020-11-24 DIAGNOSIS — F319 Bipolar disorder, unspecified: Secondary | ICD-10-CM
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
## 2020-12-02 ENCOUNTER — Ambulatory Visit: Payer: Medicaid Other | Admitting: Dermatology

## 2020-12-09 ENCOUNTER — Ambulatory Visit: Payer: Medicaid Other | Admitting: Dermatology

## 2020-12-28 ENCOUNTER — Other Ambulatory Visit (HOSPITAL_COMMUNITY)
Admission: RE | Admit: 2020-12-28 | Discharge: 2020-12-28 | Disposition: A | Discharge status: Home / Self Care | Payer: Medicaid Other | Source: Ambulatory Visit | Attending: Family Medicine | Admitting: Family Medicine

## 2020-12-28 ENCOUNTER — Other Ambulatory Visit: Payer: Self-pay

## 2020-12-28 ENCOUNTER — Ambulatory Visit (INDEPENDENT_AMBULATORY_CARE_PROVIDER_SITE_OTHER): Payer: Medicaid Other

## 2020-12-28 VITALS — BP 120/76 | HR 83

## 2020-12-28 DIAGNOSIS — Z113 Encounter for screening for infections with a predominantly sexual mode of transmission: Secondary | ICD-10-CM
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
## 2020-12-29 LAB — CERVICOVAGINAL ANCILLARY ONLY
Bacterial Vaginitis (gardnerella): NEGATIVE
Candida Glabrata: NEGATIVE
Candida Vaginitis: NEGATIVE
Chlamydia: NEGATIVE
Comment: NEGATIVE
Comment: NEGATIVE
Comment: NEGATIVE
Comment: NEGATIVE
Comment: NEGATIVE
Comment: NORMAL
Neisseria Gonorrhea: NEGATIVE
Trichomonas: NEGATIVE

## 2021-01-03 ENCOUNTER — Ambulatory Visit (INDEPENDENT_AMBULATORY_CARE_PROVIDER_SITE_OTHER): Payer: Medicaid Other | Admitting: Licensed Clinical Social Worker

## 2021-01-03 ENCOUNTER — Other Ambulatory Visit: Payer: Self-pay

## 2021-01-03 DIAGNOSIS — F319 Bipolar disorder, unspecified: Secondary | ICD-10-CM

## 2021-01-03 DIAGNOSIS — F4310 Post-traumatic stress disorder, unspecified: Secondary | ICD-10-CM

## 2021-01-03 DIAGNOSIS — F411 Generalized anxiety disorder: Secondary | ICD-10-CM
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
## 2021-01-31 ENCOUNTER — Telehealth (HOSPITAL_COMMUNITY): Payer: Medicaid Other | Admitting: Psychiatry

## 2021-02-01 ENCOUNTER — Ambulatory Visit (INDEPENDENT_AMBULATORY_CARE_PROVIDER_SITE_OTHER): Payer: Medicaid Other | Admitting: Licensed Clinical Social Worker

## 2021-02-01 ENCOUNTER — Other Ambulatory Visit: Payer: Self-pay

## 2021-02-01 DIAGNOSIS — F4310 Post-traumatic stress disorder, unspecified: Secondary | ICD-10-CM

## 2021-02-01 DIAGNOSIS — F411 Generalized anxiety disorder: Secondary | ICD-10-CM | POA: Diagnosis not present

## 2021-02-01 DIAGNOSIS — F319 Bipolar disorder, unspecified: Secondary | ICD-10-CM | POA: Diagnosis not present
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
## 2021-02-08 ENCOUNTER — Telehealth (HOSPITAL_COMMUNITY): Payer: Medicaid Other | Admitting: Psychiatry

## 2021-02-08 ENCOUNTER — Other Ambulatory Visit: Payer: Self-pay

## 2021-02-15 ENCOUNTER — Telehealth (INDEPENDENT_AMBULATORY_CARE_PROVIDER_SITE_OTHER): Payer: Medicaid Other | Admitting: Psychiatry

## 2021-02-15 ENCOUNTER — Encounter (HOSPITAL_COMMUNITY): Payer: Self-pay | Admitting: Psychiatry

## 2021-02-15 ENCOUNTER — Other Ambulatory Visit: Payer: Self-pay

## 2021-02-15 DIAGNOSIS — F4310 Post-traumatic stress disorder, unspecified: Secondary | ICD-10-CM

## 2021-02-15 DIAGNOSIS — F449 Dissociative and conversion disorder, unspecified: Secondary | ICD-10-CM

## 2021-02-15 DIAGNOSIS — F319 Bipolar disorder, unspecified: Secondary | ICD-10-CM | POA: Diagnosis not present

## 2021-02-15 MED ORDER — BREXPIPRAZOLE 1 MG PO TABS: 1.0000 mg | ORAL_TABLET | Freq: Every day | ORAL | 2 refills | Status: DC

## 2021-02-15 MED ORDER — CITALOPRAM HYDROBROMIDE 40 MG PO TABS: 40.0000 mg | ORAL_TABLET | Freq: Every day | ORAL | 2 refills | Status: DC
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
## 2021-03-07 ENCOUNTER — Ambulatory Visit (INDEPENDENT_AMBULATORY_CARE_PROVIDER_SITE_OTHER): Payer: Medicaid Other | Admitting: *Deleted

## 2021-03-07 ENCOUNTER — Other Ambulatory Visit (HOSPITAL_COMMUNITY)
Admission: RE | Admit: 2021-03-07 | Discharge: 2021-03-07 | Disposition: A | Discharge status: Home / Self Care | Payer: Medicaid Other | Source: Ambulatory Visit | Attending: Family Medicine | Admitting: Family Medicine

## 2021-03-07 ENCOUNTER — Other Ambulatory Visit: Payer: Self-pay

## 2021-03-07 VITALS — BP 125/84 | HR 86 | Ht 60.0 in | Wt 136.3 lb

## 2021-03-07 DIAGNOSIS — Z202 Contact with and (suspected) exposure to infections with a predominantly sexual mode of transmission: Secondary | ICD-10-CM | POA: Diagnosis present

## 2021-03-07 DIAGNOSIS — R300 Dysuria: Secondary | ICD-10-CM

## 2021-03-07 LAB — POCT URINALYSIS DIP (DEVICE)
Glucose, UA: NEGATIVE mg/dL
Hgb urine dipstick: NEGATIVE
Leukocytes,Ua: NEGATIVE
Nitrite: NEGATIVE
Protein, ur: NEGATIVE mg/dL
Specific Gravity, Urine: >=1.03 (ref 1.005–1.030)
Urobilinogen, UA: 0.2 mg/dL (ref 0.0–1.0)
pH: 6 (ref 5.0–8.0)

## 2021-03-07 NOTE — Progress Notes (Signed)
Patient seen and assessed by nursing staff.  Agree with documentation and plan.  
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
## 2021-03-08 ENCOUNTER — Telehealth: Payer: Self-pay | Admitting: Family Medicine

## 2021-03-08 LAB — CERVICOVAGINAL ANCILLARY ONLY
Chlamydia: NEGATIVE
Comment: NEGATIVE
Comment: NEGATIVE
Comment: NORMAL
Neisseria Gonorrhea: NEGATIVE
Trichomonas: NEGATIVE

## 2021-03-08 NOTE — Telephone Encounter (Signed)
Pt wants lab results. States she has anxiety about the labs and needs results ASAP. Pt also states cal leave detailed message on voicemail if needed

## 2021-03-08 NOTE — Telephone Encounter (Signed)
Spoke with pt. Pt asking about results from vaginal swab. Pt given negative results per lab results. Pt verbalized understanding and agreeable.   Colletta Maryland, RN  03/08/21.

## 2021-04-06 ENCOUNTER — Other Ambulatory Visit: Payer: Self-pay

## 2021-04-06 ENCOUNTER — Ambulatory Visit (INDEPENDENT_AMBULATORY_CARE_PROVIDER_SITE_OTHER): Payer: Medicaid Other | Admitting: Licensed Clinical Social Worker

## 2021-04-06 DIAGNOSIS — F4310 Post-traumatic stress disorder, unspecified: Secondary | ICD-10-CM | POA: Diagnosis not present

## 2021-04-06 DIAGNOSIS — F319 Bipolar disorder, unspecified: Secondary | ICD-10-CM

## 2021-04-06 DIAGNOSIS — F411 Generalized anxiety disorder: Secondary | ICD-10-CM | POA: Diagnosis not present
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
## 2021-04-12 ENCOUNTER — Other Ambulatory Visit: Payer: Self-pay

## 2021-04-12 MED ORDER — SLYND 4 MG PO TABS: 1.0000 | ORAL_TABLET | Freq: Every day | ORAL | 1 refills | Status: DC

## 2021-04-12 NOTE — Telephone Encounter (Signed)
Pt called requesting a refill on her BCP.  Per chart review pt was prescribed a three month supply and needed to reschedule an annual that she missed.  Two pill packs e-prescribed until she is seen for an annual exam.  Notified pt that I have refilled her BCP however we do need her to come in for annual as her insurance may cover the medication.  I informed pt that the front office will contact her to schedule an appt.  Pt verbalized understanding.   Mel Almond, RN  04/12/21

## 2021-05-18 ENCOUNTER — Ambulatory Visit: Payer: Medicaid Other | Admitting: Certified Nurse Midwife

## 2021-05-18 ENCOUNTER — Telehealth (HOSPITAL_COMMUNITY): Payer: Medicaid Other | Admitting: Psychiatry

## 2021-05-24 ENCOUNTER — Other Ambulatory Visit: Payer: Self-pay

## 2021-05-24 ENCOUNTER — Telehealth (HOSPITAL_COMMUNITY): Payer: Medicaid Other | Admitting: Psychiatry

## 2021-05-24 NOTE — Progress Notes (Unsigned)
No show

## 2021-05-31 ENCOUNTER — Other Ambulatory Visit: Payer: Self-pay

## 2021-05-31 ENCOUNTER — Other Ambulatory Visit (HOSPITAL_COMMUNITY)
Admission: RE | Admit: 2021-05-31 | Discharge: 2021-05-31 | Disposition: A | Discharge status: Home / Self Care | Payer: Medicaid Other | Source: Ambulatory Visit | Attending: Obstetrics and Gynecology | Admitting: Obstetrics and Gynecology

## 2021-05-31 ENCOUNTER — Ambulatory Visit (INDEPENDENT_AMBULATORY_CARE_PROVIDER_SITE_OTHER): Payer: Medicaid Other | Admitting: Obstetrics and Gynecology

## 2021-05-31 ENCOUNTER — Encounter: Payer: Self-pay | Admitting: Obstetrics and Gynecology

## 2021-05-31 VITALS — BP 133/83 | HR 87 | Ht 60.0 in | Wt 129.5 lb

## 2021-05-31 DIAGNOSIS — Z113 Encounter for screening for infections with a predominantly sexual mode of transmission: Secondary | ICD-10-CM | POA: Diagnosis not present

## 2021-05-31 DIAGNOSIS — Z3041 Encounter for surveillance of contraceptive pills: Secondary | ICD-10-CM | POA: Diagnosis not present

## 2021-05-31 DIAGNOSIS — Z124 Encounter for screening for malignant neoplasm of cervix: Secondary | ICD-10-CM | POA: Insufficient documentation

## 2021-05-31 DIAGNOSIS — Z3202 Encounter for pregnancy test, result negative: Secondary | ICD-10-CM | POA: Diagnosis not present

## 2021-05-31 DIAGNOSIS — Z0000 Encounter for general adult medical examination without abnormal findings: Secondary | ICD-10-CM

## 2021-05-31 LAB — POCT PREGNANCY, URINE: Preg Test, Ur: NEGATIVE

## 2021-05-31 MED ORDER — SLYND 4 MG PO TABS
1.0000 | ORAL_TABLET | Freq: Every day | ORAL | 11 refills | Status: DC
Start: 2021-05-31 — End: 2022-04-11

## 2021-05-31 NOTE — Progress Notes (Signed)
.     History:  [name] is a 30 y.o. [phone] who presents to clinic today for annual gyn exam.   Has not had a birth control script for past few weeks, has been having unprotected sex. Her insurance company won't refill her birth control until she is seen at office. Reports very happy with current birth control. Did not like implanon. Does not intend to become pregnant in the next year.   Currently with one partner but has had a few different partners over the year.  No vaginal discharge. Desires STD screening today.   No issues with periods on current birth control. Reports regular cycles. Just started period today but it is not heavy.    The following portions of the patient's history were reviewed and updated as appropriate: allergies, current medications, family history, past medical history, social history, past surgical history and problem list.  Review of Systems:  Review of Systems  Constitutional:  Negative for chills and fever.  Gastrointestinal:  Negative for abdominal pain, constipation and diarrhea.  Genitourinary:  Negative for dysuria.  Skin:  Negative for rash.  Neurological:  Negative for dizziness and headaches.     Objective:  Physical Exam BP 133/83   Pulse 87   Ht 5' (1.524 m)   Wt 129 lb 8 oz (58.7 kg)   LMP 05/31/2021   BMI 25.29 kg/m  Physical Exam Vitals and nursing note reviewed. Exam conducted with a chaperone present.  Constitutional:      Appearance: Normal appearance.  Cardiovascular:     Rate and Rhythm: Normal rate.  Pulmonary:     Effort: Pulmonary effort is normal.  Abdominal:     Palpations: Abdomen is soft.     Tenderness: There is no abdominal tenderness.  Genitourinary:    General: Normal vulva.     Comments: Scant dark blood at cervical os. No lesions. Not friable.  Neurological:     General: No focal deficit present.     Mental Status: She is alert.  Psychiatric:        Mood and Affect: Mood normal.         Behavior: Behavior normal.      Labs and Imaging No results found for this or any previous visit (from the past 24 hour(s)).  No results found.   Assessment & Plan:  1. Annual physical exam -return in one year   2. Screening for STD (sexually transmitted disease)  3. Screening for cervical cancer -pap performed. Hx of abnormal pap and cryotherapy in past, last pap NILM 2019.   4. Encounter for birth control pills maintenance -refilled birth control. Discussed other options and patient is happy with current med.     Janet Berlin, MD 05/31/2021 8:50 AM OB Family Medicine Fellow, [facility] for [employer], [place]

## 2021-06-01 LAB — RPR: RPR Ser Ql: NONREACTIVE

## 2021-06-01 LAB — HEPATITIS C ANTIBODY: Hep C Virus Ab: <0.1 s/co ratio (ref 0.0–0.9)

## 2021-06-01 LAB — HEPATITIS B SURFACE ANTIGEN: Hepatitis B Surface Ag: NEGATIVE

## 2021-06-01 LAB — HIV ANTIBODY (ROUTINE TESTING W REFLEX): HIV Screen 4th Generation wRfx: NONREACTIVE

## 2021-06-02 LAB — CYTOLOGY - PAP
Chlamydia: NEGATIVE
Comment: NEGATIVE
Comment: NEGATIVE
Comment: NORMAL
Diagnosis: NEGATIVE
Neisseria Gonorrhea: NEGATIVE
Trichomonas: NEGATIVE

## 2021-06-06 ENCOUNTER — Other Ambulatory Visit: Payer: Self-pay

## 2021-06-06 ENCOUNTER — Emergency Department
Admission: EM | Admit: 2021-06-06 | Discharge: 2021-06-06 | Disposition: A | Discharge status: Home / Self Care | Payer: Medicaid Other | Source: Home / Self Care | Attending: Family Medicine | Admitting: Family Medicine

## 2021-06-06 DIAGNOSIS — R059 Cough, unspecified: Secondary | ICD-10-CM

## 2021-06-06 MED ORDER — BENZONATATE 200 MG PO CAPS: 200.0000 mg | ORAL_CAPSULE | Freq: Two times a day (BID) | ORAL | 0 refills | Status: DC | PRN

## 2021-06-06 MED ORDER — PREDNISONE 20 MG PO TABS: 20.0000 mg | ORAL_TABLET | Freq: Two times a day (BID) | ORAL | 0 refills | Status: DC
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
## 2021-06-06 NOTE — ED Triage Notes (Signed)
Pt presents to Urgent Care with c/o cough since yesterday. Reports that she tried vaping for the first time 2 nights ago and believes she may have bronchitis--states she is "allergic to second-hand smoke."

## 2021-06-06 NOTE — Discharge Instructions (Signed)
Continue Delsym every 12 hours Add Tessalon 1 pill every 12 hours This combination should help control your cough Take the prednisone 2 times a day for 5 days. Drink lots of water

## 2021-07-21 ENCOUNTER — Ambulatory Visit: Payer: Medicaid Other | Admitting: Dermatology

## 2021-07-21 ENCOUNTER — Other Ambulatory Visit: Payer: Self-pay

## 2021-07-21 DIAGNOSIS — D2271 Melanocytic nevi of right lower limb, including hip: Secondary | ICD-10-CM | POA: Diagnosis not present

## 2021-07-21 DIAGNOSIS — Z86018 Personal history of other benign neoplasm: Secondary | ICD-10-CM

## 2021-07-21 DIAGNOSIS — R591 Generalized enlarged lymph nodes: Secondary | ICD-10-CM

## 2021-07-21 DIAGNOSIS — D489 Neoplasm of uncertain behavior, unspecified: Secondary | ICD-10-CM

## 2021-07-21 DIAGNOSIS — D1800 Hemangioma unspecified site: Secondary | ICD-10-CM

## 2021-07-21 DIAGNOSIS — D229 Melanocytic nevi, unspecified: Secondary | ICD-10-CM

## 2021-07-21 DIAGNOSIS — S60452A Superficial foreign body of right middle finger, initial encounter: Secondary | ICD-10-CM | POA: Diagnosis not present

## 2021-07-21 DIAGNOSIS — Z1283 Encounter for screening for malignant neoplasm of skin: Secondary | ICD-10-CM | POA: Diagnosis not present

## 2021-07-21 DIAGNOSIS — L700 Acne vulgaris: Secondary | ICD-10-CM | POA: Diagnosis not present

## 2021-07-21 DIAGNOSIS — L814 Other melanin hyperpigmentation: Secondary | ICD-10-CM

## 2021-07-21 DIAGNOSIS — L578 Other skin changes due to chronic exposure to nonionizing radiation: Secondary | ICD-10-CM

## 2021-07-21 DIAGNOSIS — L821 Other seborrheic keratosis: Secondary | ICD-10-CM

## 2021-07-21 DIAGNOSIS — T148XXA Other injury of unspecified body region, initial encounter: Secondary | ICD-10-CM

## 2021-07-21 MED ORDER — TRETINOIN 0.025 % EX CREA: TOPICAL_CREAM | CUTANEOUS | 6 refills | Status: DC

## 2021-07-21 MED ORDER — CLINDAMYCIN PHOS-BENZOYL PEROX 1.2-5 % EX GEL: CUTANEOUS | 3 refills | Status: DC

## 2021-07-21 MED ORDER — MUPIROCIN 2 % EX OINT: TOPICAL_OINTMENT | CUTANEOUS | 1 refills | Status: DC

## 2021-07-21 NOTE — Patient Instructions (Signed)
If you have any questions or concerns for your doctor, please call our main line at [phone] and press option 4 to reach your doctor's medical assistant. If no one answers, please leave a voicemail as directed and we will return your call as soon as possible. Messages left after 4 pm will be answered the following business day.   You may also send us a message via MyChart. We typically respond to MyChart messages within 1-2 business days.  For prescription refills, please ask your pharmacy to contact our office. Our fax number is [phone].  If you have an urgent issue when the clinic is closed that cannot wait until the next business day, you can page your doctor at the number below.    Please note that while we do our best to be available for urgent issues outside of office hours, we are not available 24/7.   If you have an urgent issue and are unable to reach us, you may choose to seek medical care at your doctor's office, retail clinic, urgent care center, or emergency room.  If you have a medical emergency, please immediately call 911 or go to the emergency department.  Pager Numbers  - Dr. Kowalski: [phone]  - Dr. Moye: [phone]  - Dr. Stewart: [phone]  In the event of inclement weather, please call our main line at [phone] for an update on the status of any delays or closures.  Dermatology Medication Tips: Please keep the boxes that topical medications come in in order to help keep track of the instructions about where and how to use these. Pharmacies typically print the medication instructions only on the boxes and not directly on the medication tubes.   If your medication is too expensive, please contact our office at [phone] option 4 or send us a message through MyChart.   We are unable to tell what your co-pay for medications will be in advance as this is different depending on your insurance coverage. However, we may be able to find a substitute  medication at lower cost or fill out paperwork to get insurance to cover a needed medication.   If a prior authorization is required to get your medication covered by your insurance company, please allow us 1-2 business days to complete this process.  Drug prices often vary depending on where the prescription is filled and some pharmacies may offer cheaper prices.  The website www.goodrx.com contains coupons for medications through different pharmacies. The prices here do not account for what the cost may be with help from insurance (it may be cheaper with your insurance), but the website can give you the price if you did not use any insurance.  - You can print the associated coupon and take it with your prescription to the pharmacy.  - You may also stop by our office during regular business hours and pick up a GoodRx coupon card.  - If you need your prescription sent electronically to a different pharmacy, notify our office through [facility] MyChart or by phone at [phone] option 4.  Topical retinoid medications like tretinoin/Retin-A, adapalene/Differin, tazarotene/Fabior, and Epiduo/Epiduo Forte can cause dryness and irritation when first started. Only apply a pea-sized amount to the entire affected area. Avoid applying it around the eyes, edges of mouth and creases at the nose. If you experience irritation, use a good moisturizer first and/or apply the medicine less often. If you are doing well with the medicine, you can increase how often you use it until you   are applying every night. Be careful with sun protection while using this medication as it can make you sensitive to the sun. This medicine should not be used by pregnant women.   

## 2021-07-21 NOTE — Progress Notes (Signed)
Follow-Up Visit   Subjective  Whitney Gonzalez is a 30 y.o. female who presents for the following: Annual Exam (Hx dysplastic nevus - patient has a splinter on her R middle finger and may still have some residual left in skin. Bump on the L side of neck that patient would like checked today a hx of vaping and has a sore throat and "hoarse" voice. ). She stopped the Spironolactone because she felt it wasn't helping and ran out of Clindamycin. Her face continues to break out.   The following portions of the chart were reviewed this encounter and updated as appropriate:   Tobacco  Allergies  Meds  Problems  Med Hx  Surg Hx  Fam Hx     Review of Systems:  No other skin or systemic complaints except as noted in HPI or Assessment and Plan.  Objective  Well appearing patient in no apparent distress; mood and affect are within normal limits.  A full examination was performed including scalp, head, eyes, ears, nose, lips, neck, chest, axillae, abdomen, back, buttocks, bilateral upper extremities, bilateral lower extremities, hands, feet, fingers, toes, fingernails, and toenails. All findings within normal limits unless otherwise noted below.  Assessment & Plan  Acne vulgaris Face  Chronic condition with duration or expected duration over one year. Condition is bothersome to patient. Currently flared.  Patient has allergies to multiple oral antibiotics.   She did not notice significant improvement on spironolactone.  Topical retinoid medications like tretinoin/Retin-A, adapalene/Differin, tazarotene/Fabior, and Epiduo/Epiduo Forte can cause dryness and irritation when first started. Only apply a pea-sized amount to the entire affected area. Avoid applying it around the eyes, edges of mouth and creases at the nose. If you experience irritation, use a good moisturizer first and/or apply the medicine less often. If you are doing well with the medicine, you can increase how often you use it  until you are applying every night. Be careful with sun protection while using this medication as it can make you sensitive to the sun. This medicine should not be used by pregnant women.   Benzoyl peroxide can cause dryness and irritation of the skin. It can also bleach fabric. When used together with Aczone (dapsone) cream, it can stain the skin orange.  Recommended non-comedogenic (non-acne causing) facial oils include 100% argan oil or squalane. The can be used after applying any recommended creams or ointments to the skin in the evening. [employer] has a high-quality and affordable version of both of these and can be found at [place].  Consider Isotretinoin if not improving with treatment   Clindamycin-Benzoyl Per, Refr, (DUAC) gel - Face Apply a thin coat to the entire face QAM.  tretinoin (RETIN-A) 0.025 % cream - Face Apply a thin coat to the face QHS.  Splinter R middle finger  Seems to have been removed. Start Mupirocin 2% ointment to aa until healed.   mupirocin ointment (BACTROBAN) 2 % - R middle finger Apply to aa QD until healed.  Neoplasm of uncertain behavior R lower leg  Epidermal / dermal shaving  Lesion diameter (cm):  0.4 Informed consent: discussed and consent obtained   Timeout: patient name, date of birth, surgical site, and procedure verified   Procedure prep:  Patient was prepped and draped in usual sterile fashion Prep type:  Isopropyl alcohol Anesthesia: the lesion was anesthetized in a standard fashion   Anesthetic:  1% lidocaine w/ epinephrine 1-100,000 buffered w/ 8.4% NaHCO3 Instrument used: flexible razor  blade   Hemostasis achieved with: pressure, aluminum chloride and electrodesiccation   Outcome: patient tolerated procedure well   Post-procedure details: sterile dressing applied and wound care instructions given   Dressing type: bandage and petrolatum    Specimen 1 - Surgical pathology Differential Diagnosis: D48.5 r/o  dysplastic nevus Check Margins: No  Benign-appearing.  Observation.  Call clinic for new or changing moles.  Recommend daily use of broad spectrum spf 30+ sunscreen to sun-exposed areas.   Lymphadenopathy L neck  With associated hoarseness.  Patient scheduled with ENT tomorrow. Follow up with ENT or PCP.   Lentigines - Scattered tan macules - Due to sun exposure - Benign-appering, observe - Recommend daily broad spectrum sunscreen SPF 30+ to sun-exposed areas, reapply every 2 hours as needed. - Call for any changes  Seborrheic Keratoses - Stuck-on, waxy, tan-brown papules and/or plaques  - Benign-appearing - Discussed benign etiology and prognosis. - Observe - Call for any changes  Melanocytic Nevi - Tan-brown and/or pink-flesh-colored symmetric macules and papules - Benign appearing on exam today - Observation - Call clinic for new or changing moles - Recommend daily use of broad spectrum spf 30+ sunscreen to sun-exposed areas.   Hemangiomas - Red papules - Discussed benign nature - Observe - Call for any changes  Actinic Damage - Chronic condition, secondary to cumulative UV/sun exposure - diffuse scaly erythematous macules with underlying dyspigmentation - Recommend daily broad spectrum sunscreen SPF 30+ to sun-exposed areas, reapply every 2 hours as needed.  - Staying in the shade or wearing long sleeves, sun glasses (UVA+UVB protection) and wide brim hats (4-inch brim around the entire circumference of the hat) are also recommended for sun protection.  - Call for new or changing lesions.  Skin cancer screening performed today.  Return in about 3 months (around 10/21/2021) for acne follow up .  Luther Redo, CMA, am acting as scribe for Forest Gleason, MD .   Documentation: I have reviewed the above documentation for accuracy and completeness, and I agree with the above.  Forest Gleason, MD

## 2021-07-30 ENCOUNTER — Encounter: Payer: Self-pay | Admitting: Dermatology

## 2021-08-15 DIAGNOSIS — R221 Localized swelling, mass and lump, neck: Secondary | ICD-10-CM | POA: Insufficient documentation

## 2021-09-07 ENCOUNTER — Ambulatory Visit (INDEPENDENT_AMBULATORY_CARE_PROVIDER_SITE_OTHER): Payer: Medicaid Other | Admitting: Obstetrics and Gynecology

## 2021-09-07 ENCOUNTER — Other Ambulatory Visit: Payer: Self-pay

## 2021-09-07 ENCOUNTER — Other Ambulatory Visit (HOSPITAL_COMMUNITY)
Admission: RE | Admit: 2021-09-07 | Discharge: 2021-09-07 | Disposition: A | Discharge status: Home / Self Care | Payer: Medicaid Other | Source: Ambulatory Visit | Attending: Obstetrics and Gynecology | Admitting: Obstetrics and Gynecology

## 2021-09-07 ENCOUNTER — Encounter: Payer: Self-pay | Admitting: Obstetrics and Gynecology

## 2021-09-07 VITALS — BP 124/90 | HR 90 | Ht 60.0 in | Wt 127.0 lb

## 2021-09-07 DIAGNOSIS — Z3202 Encounter for pregnancy test, result negative: Secondary | ICD-10-CM

## 2021-09-07 DIAGNOSIS — N898 Other specified noninflammatory disorders of vagina: Secondary | ICD-10-CM | POA: Insufficient documentation

## 2021-09-07 DIAGNOSIS — N926 Irregular menstruation, unspecified: Secondary | ICD-10-CM | POA: Insufficient documentation

## 2021-09-07 HISTORY — DX: Irregular menstruation, unspecified: N92.6

## 2021-09-07 LAB — POCT PREGNANCY, URINE: Preg Test, Ur: NEGATIVE

## 2021-09-07 NOTE — Progress Notes (Signed)
  CC: spotting vaginal odor Subjective:    Patient ID: Whitney Gonzalez, female    DOB: 05-Nov-1991, 30 y.o.   MRN: [id number]  HPI: 30 yo seen for vaginal spotting and odor after intercourse after a long period of celibacy.  Pt takes OCP , but does admit missing one day of pills and then doubling up the following day.  Otherwise pt is asymptomatic.r     Review of Systems     Objective:   Physical Exam Vitals:   09/07/21 1320  BP: 124/90  Pulse: 90  SSE: cvx WNL.  No active bleeding or old blood in the vagina. Vagina: tissue WNL, no abnl d/c       Assessment & Plan:   1. Vaginal odor  - Cervicovaginal ancillary only( [place])  2. Irregular periods Expectant management at this time as pt continues OCP - Cervicovaginal ancillary only( [place])  3. Vaginal discharge Check findings from vaginal swab, treat as needed.    Griffin Basil, MD Faculty Attending, [facility]

## 2021-09-09 DIAGNOSIS — B9689 Other specified bacterial agents as the cause of diseases classified elsewhere: Secondary | ICD-10-CM

## 2021-09-09 LAB — CERVICOVAGINAL ANCILLARY ONLY
Bacterial Vaginitis (gardnerella): POSITIVE — AB
Candida Glabrata: NEGATIVE
Candida Vaginitis: NEGATIVE
Chlamydia: NEGATIVE
Comment: NEGATIVE
Comment: NEGATIVE
Comment: NEGATIVE
Comment: NEGATIVE
Comment: NEGATIVE
Comment: NORMAL
Neisseria Gonorrhea: NEGATIVE
Trichomonas: NEGATIVE

## 2021-09-12 ENCOUNTER — Other Ambulatory Visit: Payer: Self-pay

## 2021-09-12 ENCOUNTER — Telehealth: Payer: Self-pay

## 2021-09-12 MED ORDER — CLINDAMYCIN HCL 300 MG PO CAPS: 300.0000 mg | ORAL_CAPSULE | Freq: Two times a day (BID) | ORAL | 0 refills | Status: DC

## 2021-09-12 NOTE — Progress Notes (Signed)
Duplicate encounter

## 2021-09-12 NOTE — Telephone Encounter (Signed)
Pt calling she had a biopsy taken from her leg 07-21-21, area is not healing, she would like for this area to be checked,   Pt worked in the schedule with  Dr Laurence Ferrari 09/14/21 at 1:30 pm

## 2021-09-14 ENCOUNTER — Encounter: Payer: Self-pay | Admitting: Dermatology

## 2021-09-14 ENCOUNTER — Other Ambulatory Visit: Payer: Self-pay

## 2021-09-14 ENCOUNTER — Ambulatory Visit: Payer: Medicaid Other | Admitting: Dermatology

## 2021-09-14 DIAGNOSIS — L700 Acne vulgaris: Secondary | ICD-10-CM | POA: Diagnosis not present

## 2021-09-14 DIAGNOSIS — D2261 Melanocytic nevi of right upper limb, including shoulder: Secondary | ICD-10-CM

## 2021-09-14 DIAGNOSIS — L905 Scar conditions and fibrosis of skin: Secondary | ICD-10-CM

## 2021-09-14 DIAGNOSIS — K130 Diseases of lips: Secondary | ICD-10-CM | POA: Diagnosis not present

## 2021-09-14 DIAGNOSIS — L814 Other melanin hyperpigmentation: Secondary | ICD-10-CM

## 2021-09-14 DIAGNOSIS — L731 Pseudofolliculitis barbae: Secondary | ICD-10-CM

## 2021-09-14 DIAGNOSIS — D229 Melanocytic nevi, unspecified: Secondary | ICD-10-CM

## 2021-09-14 MED ORDER — RETIN-A MICRO PUMP 0.06 % EX GEL: CUTANEOUS | 2 refills | Status: DC

## 2021-09-14 MED ORDER — AMZEEQ 4 % EX FOAM: CUTANEOUS | 2 refills | Status: DC

## 2021-09-14 NOTE — Patient Instructions (Addendum)
Topical retinoid medications like tretinoin/Retin-A, adapalene/Differin, tazarotene/Fabior, and Epiduo/Epiduo Forte can cause dryness and irritation when first started. Only apply a pea-sized amount to the entire affected area. Avoid applying it around the eyes, edges of mouth and creases at the nose. If you experience irritation, use a good moisturizer first and/or apply the medicine less often. If you are doing well with the medicine, you can increase how often you use it until you are applying every night. Be careful with sun protection while using this medication as it can make you sensitive to the sun. This medicine should not be used by pregnant women.    Recommend Serica moisturizing scar formula cream every night or [facility] brand or Mederma silicone scar sheet every night for the first year after a scar appears to help with scar remodeling if desired. Scars remodel on their own for a full year.  If you have any questions or concerns for your doctor, please call our main line at [phone] and press option 4 to reach your doctor's medical assistant. If no one answers, please leave a voicemail as directed and we will return your call as soon as possible. Messages left after 4 pm will be answered the following business day.   You may also send [place] a message via [place]. We typically respond to MyChart messages within 1-2 business days.  For prescription refills, please ask your pharmacy to contact our office. Our fax number is [phone].  If you have an urgent issue when the clinic is closed that cannot wait until the next business day, you can page your doctor at the number below.    Please note that while we do our best to be available for urgent issues outside of office hours, we are not available 24/7.   If you have an urgent issue and are unable to reach [place], you may choose to seek medical care at your doctor's office, retail clinic, urgent care center, or emergency room.  If you have  a medical emergency, please immediately call 911 or go to the emergency department.  Pager Numbers  - Dr. Nehemiah Massed: [phone]  - Dr. Laurence Ferrari: [phone]  - Dr. Nicole Kindred: [phone]  In the event of inclement weather, please call our main line at [phone] for an update on the status of any delays or closures.  Dermatology Medication Tips: Please keep the boxes that topical medications come in in order to help keep track of the instructions about where and how to use these. Pharmacies typically print the medication instructions only on the boxes and not directly on the medication tubes.   If your medication is too expensive, please contact our office at [phone] option 4 or send [place] a message through [place].   We are unable to tell what your co-pay for medications will be in advance as this is different depending on your insurance coverage. However, we may be able to find a substitute medication at lower cost or fill out paperwork to get insurance to cover a needed medication.   If a prior authorization is required to get your medication covered by your insurance company, please allow [place] 1-2 business days to complete this process.  Drug prices often vary depending on where the prescription is filled and some pharmacies may offer cheaper prices.  The website www.goodrx.com contains coupons for medications through different pharmacies. The prices here do not account for what the cost may be with help from insurance (it may be cheaper with your insurance), but the website  can give you the price if you did not use any insurance.  - You can print the associated coupon and take it with your prescription to the pharmacy.  - You may also stop by our office during regular business hours and pick up a GoodRx coupon card.  - If you need your prescription sent electronically to a different pharmacy, notify our office through [facility] or by phone at [phone] option 4.

## 2021-09-14 NOTE — Progress Notes (Signed)
Follow-Up Visit   Subjective  Whitney Gonzalez is a 30 y.o. female who presents for the following: Skin Problem (Recheck Bx site, right medial lower leg. Dx: benign nevus. Scaly, webbing-type look. Purple color. Patient does not think is healing normally. ), Nevus (Right inner upper arm. Thinks mole is changing in color. ), and Acne (Flaring prior to menses. Using Clindamycin/Benzoyl peroxide gel qam, Tretinoin 0.025% cream qhs. Stopped Panoxyl on back as acne cleared. ).   The following portions of the chart were reviewed this encounter and updated as appropriate:  Tobacco  Allergies  Meds  Problems  Med Hx  Surg Hx  Fam Hx      Review of Systems: No other skin or systemic complaints except as noted in HPI or Assessment and Plan.   Objective  Well appearing patient in no apparent distress; mood and affect are within normal limits.  A focused examination was performed including right leg, right upper arm. Relevant physical exam findings are noted in the Assessment and Plan.  right medial lower leg Violaceous thin papule with no sign of recurrence.   Head - Anterior (Face) Trace open comedones, with several inflammatory papules at face, chest with rare inflammatory papules, back with scattered inflammatory papules and pustules and comedones.   lips Erythema with scale  Right Upper Arm - Anterior Small brown macule with adjacent tan macule  Pubic Inflamed papule with hair  Assessment & Plan  Scar right medial lower leg  Reassured.  Recommend Serica moisturizing scar formula cream every night or [facility] brand or Mederma silicone scar sheet every night for the first year after a scar appears to help with scar remodeling if desired. Scars remodel on their own for a full year.   Acne vulgaris Head - Anterior (Face)  Chronic condition with duration or expected duration over one year. Condition is bothersome to patient. Not currently at goal.  Has failed  spironolactone. Allergic to doxycycline.   D/c Retin-A 0.025% cream. Start Retin A Micro 0.06% gel QHS.  Start Amzeeq foam thin layer qhs over Retin A.  Did discuss that this is a similar medicine to the doxycycline she cannot take by mouth and that it is possible she could have a reaction to this. However, it is much less likely she would have a reaction to topical minocycline. We do have more limited options available given her allergies. She has an epi pen available and if she has any reaction, she will stop the medicine immediately and seek medical care.  May consider submitting for [facility] approval in future, though it is not listed as preferred or non-preferred for her insurance coverage  Continue benzoyl peroxide wash  Tretinoin Microsphere (RETIN-A MICRO PUMP) 0.06 % GEL - Head - Anterior (Face) Apply qhs to face  Minocycline HCl Micronized (AMZEEQ) 4 % FOAM - Head - Anterior (Face) QHS to face, over Retin A  Cheilitis lips  Recommend OTC HC cream BID PRN up to 2 weeks  Nevus Right Upper Arm - Anterior  With lentigo  Benign-appearing.  Observation.  Call clinic for new or changing lesions.  Recommend daily use of broad spectrum spf 30+ sunscreen to sun-exposed areas.    Ingrown hair Pubic  After prepping with isopropyl alcohol, hair removed from site today with forceps.  Return for acne recheck in 6 weeks.Loraine Maple, CMA, am acting as scribe for Forest Gleason, MD.  Documentation: I have reviewed the above documentation for accuracy and completeness, and I  agree with the above.  Forest Gleason, MD

## 2021-10-20 ENCOUNTER — Other Ambulatory Visit: Payer: Self-pay

## 2021-10-20 ENCOUNTER — Emergency Department
Admission: EM | Admit: 2021-10-20 | Discharge: 2021-10-20 | Disposition: A | Discharge status: Home / Self Care | Payer: Medicaid Other | Source: Home / Self Care | Attending: Family Medicine | Admitting: Family Medicine

## 2021-10-20 DIAGNOSIS — R42 Dizziness and giddiness: Secondary | ICD-10-CM

## 2021-10-20 DIAGNOSIS — R55 Syncope and collapse: Secondary | ICD-10-CM | POA: Diagnosis not present

## 2021-10-20 DIAGNOSIS — R350 Frequency of micturition: Secondary | ICD-10-CM

## 2021-10-20 LAB — POCT URINALYSIS DIP (MANUAL ENTRY)
Bilirubin, UA: NEGATIVE
Blood, UA: NEGATIVE
Glucose, UA: NEGATIVE mg/dL
Ketones, POC UA: NEGATIVE mg/dL
Leukocytes, UA: NEGATIVE
Nitrite, UA: NEGATIVE
Protein Ur, POC: NEGATIVE mg/dL
Spec Grav, UA: 1.02 (ref 1.010–1.025)
Urobilinogen, UA: 0.2 E.U./dL
pH, UA: 6.5 (ref 5.0–8.0)

## 2021-10-20 LAB — POCT URINE PREGNANCY: Preg Test, Ur: NEGATIVE

## 2021-10-20 NOTE — Discharge Instructions (Addendum)
Call your primary care doctor for follow-up visit as soon as it is available  Check your test results on MyChart

## 2021-10-20 NOTE — ED Triage Notes (Signed)
Pt present dizziness, nausea and urinary frequency. Symptom started five days ago. Pt state she is constant going to the bathroom more frequently than normal.  Pt state that she is experiencing dizziness to the point her vision goes black.  Then she feel like she going to pass out but she has not.

## 2021-10-21 ENCOUNTER — Other Ambulatory Visit (HOSPITAL_COMMUNITY)
Admission: RE | Admit: 2021-10-21 | Discharge: 2021-10-21 | Disposition: A | Discharge status: Home / Self Care | Payer: Medicaid Other | Source: Ambulatory Visit | Attending: Family Medicine | Admitting: Family Medicine

## 2021-10-21 ENCOUNTER — Ambulatory Visit: Payer: Medicaid Other

## 2021-10-21 VITALS — BP 129/85 | HR 76 | Ht 60.0 in | Wt 126.8 lb

## 2021-10-21 DIAGNOSIS — N898 Other specified noninflammatory disorders of vagina: Secondary | ICD-10-CM | POA: Diagnosis not present

## 2021-10-21 DIAGNOSIS — Z3202 Encounter for pregnancy test, result negative: Secondary | ICD-10-CM | POA: Diagnosis not present

## 2021-10-21 DIAGNOSIS — R350 Frequency of micturition: Secondary | ICD-10-CM | POA: Diagnosis not present

## 2021-10-21 LAB — COMPLETE METABOLIC PANEL WITH GFR
AG Ratio: 1.9 (calc) (ref 1.0–2.5)
ALT: 7 U/L (ref 6–29)
AST: 12 U/L (ref 10–30)
Albumin: 4.6 g/dL (ref 3.6–5.1)
Alkaline phosphatase (APISO): 52 U/L (ref 31–125)
BUN: 9 mg/dL (ref 7–25)
CO2: 24 mmol/L (ref 20–32)
Calcium: 9.8 mg/dL (ref 8.6–10.2)
Chloride: 106 mmol/L (ref 98–110)
Creat: 0.63 mg/dL (ref 0.50–0.96)
Globulin: 2.4 g/dL (calc) (ref 1.9–3.7)
Glucose, Bld: 90 mg/dL (ref 65–99)
Potassium: 3.9 mmol/L (ref 3.5–5.3)
Sodium: 141 mmol/L (ref 135–146)
Total Bilirubin: 0.3 mg/dL (ref 0.2–1.2)
Total Protein: 7 g/dL (ref 6.1–8.1)
eGFR: 123 mL/min/{1.73_m2} (ref >=60)

## 2021-10-21 LAB — CBC WITH DIFFERENTIAL/PLATELET
Absolute Monocytes: 475 cells/uL (ref 200–950)
Basophils Absolute: 29 cells/uL (ref 0–200)
Basophils Relative: 0.4 %
Eosinophils Absolute: 122 cells/uL (ref 15–500)
Eosinophils Relative: 1.7 %
HCT: 40.3 % (ref 35.0–45.0)
Hemoglobin: 13.6 g/dL (ref 11.7–15.5)
Lymphs Abs: 2477 cells/uL (ref 850–3900)
MCH: 31 pg (ref 27.0–33.0)
MCHC: 33.7 g/dL (ref 32.0–36.0)
MCV: 91.8 fL (ref 80.0–100.0)
MPV: 11.1 fL (ref 7.5–12.5)
Monocytes Relative: 6.6 %
Neutro Abs: 4097 cells/uL (ref 1500–7800)
Neutrophils Relative %: 56.9 %
Platelets: 283 10*3/uL (ref 140–400)
RBC: 4.39 10*6/uL (ref 3.80–5.10)
RDW: 12.2 % (ref 11.0–15.0)
Total Lymphocyte: 34.4 %
WBC: 7.2 10*3/uL (ref 3.8–10.8)

## 2021-10-21 LAB — POCT PREGNANCY, URINE: Preg Test, Ur: NEGATIVE

## 2021-10-21 NOTE — Progress Notes (Signed)
Patient was assessed and managed by nursing staff during this encounter. I have reviewed the chart and agree with the documentation and plan.   Verita Schneiders, MD 10/21/2021 9:35 AM

## 2021-10-21 NOTE — Progress Notes (Signed)
Pt dropped off urine today for UPT. UPT is negative. Pt states has not taken any home UPT.   Pt states having irregular cycles, so unsure of LMP. Pt has unprotected IC on 10/24 and 10/30. Pt is taking BC pill and has only missed one dose, but doubled up the next day. Pt states had unprotected IC around these days with missed doses.   Pt has symptoms of frequency urination, nausea, increased hunger, nipple soreness and feeling dizzy intermittently and increased vaginal discharge. Went to ER yesterday for dizziness. Pt advised to follow up with OB/GYN. Pt states only eating 2 times a day and 30 oz to drink daily. Pt advised to increase hydration and eat 3 healthy meals.   Pt had +BV on 10/10. Pt advised to do self swab today. Self swab collected today. Pt advised results will take 24-48 hours and will see results in mychart and will be notified if needs further treatment.   Denies urinary burning or urgency. Pt advised will send urine for UC today. Pt advised to retake a home UPT in 2 weeks and if positive, then come and establish new OB care. Pt verbalized understanding to plan of care.    Colletta Maryland, RN

## 2021-10-23 LAB — URINE CULTURE: Organism ID, Bacteria: NO GROWTH

## 2021-10-24 LAB — CERVICOVAGINAL ANCILLARY ONLY
Bacterial Vaginitis (gardnerella): NEGATIVE
Candida Glabrata: NEGATIVE
Candida Vaginitis: NEGATIVE
Chlamydia: NEGATIVE
Comment: NEGATIVE
Comment: NEGATIVE
Comment: NEGATIVE
Comment: NEGATIVE
Comment: NEGATIVE
Comment: NORMAL
Neisseria Gonorrhea: NEGATIVE
Trichomonas: NEGATIVE
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
## 2021-10-25 ENCOUNTER — Ambulatory Visit: Payer: Medicaid Other

## 2021-10-26 ENCOUNTER — Emergency Department (INDEPENDENT_AMBULATORY_CARE_PROVIDER_SITE_OTHER)
Admission: EM | Admit: 2021-10-26 | Discharge: 2021-10-26 | Disposition: A | Discharge status: Home / Self Care | Payer: Medicaid Other | Source: Home / Self Care

## 2021-10-26 ENCOUNTER — Other Ambulatory Visit: Payer: Self-pay

## 2021-10-26 ENCOUNTER — Encounter: Payer: Self-pay | Admitting: Emergency Medicine

## 2021-10-26 DIAGNOSIS — R059 Cough, unspecified: Secondary | ICD-10-CM

## 2021-10-26 DIAGNOSIS — J101 Influenza due to other identified influenza virus with other respiratory manifestations: Secondary | ICD-10-CM | POA: Diagnosis not present

## 2021-10-26 DIAGNOSIS — R509 Fever, unspecified: Secondary | ICD-10-CM

## 2021-10-26 LAB — POCT INFLUENZA A/B
Influenza A, POC: POSITIVE — AB
Influenza B, POC: NEGATIVE

## 2021-10-26 MED ORDER — OSELTAMIVIR PHOSPHATE 75 MG PO CAPS: 75.0000 mg | ORAL_CAPSULE | Freq: Two times a day (BID) | ORAL | 0 refills | Status: DC

## 2021-10-26 MED ORDER — BENZONATATE 200 MG PO CAPS: 200.0000 mg | ORAL_CAPSULE | Freq: Three times a day (TID) | ORAL | 0 refills | Status: AC | PRN

## 2021-10-26 NOTE — ED Triage Notes (Signed)
Flu exposure last week  Diarrhea yesterday - none today  Delysm for cough last night  Fever 100 this am - took Ibuprofen 400mg  & tylenol 1gm at 0900 today  Cough & fatigue today
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
## 2021-10-26 NOTE — Discharge Instructions (Addendum)
Advised patient to take medication as directed with food to completion.  Advised patient may use Tessalon Perles daily, as needed for cough.  Advised patient may alternate between OTC Tylenol 1000 mg 1-2 times daily, as needed with OTC Ibuprofen 400 to 600 mg 1-2 times daily, as needed.  Encouraged patient increase daily water intake while taking these medications work note provided per request.

## 2021-11-02 ENCOUNTER — Ambulatory Visit: Payer: Medicaid Other | Admitting: Dermatology

## 2021-11-06 ENCOUNTER — Other Ambulatory Visit: Payer: Self-pay

## 2021-11-06 ENCOUNTER — Telehealth: Payer: Self-pay

## 2021-11-06 ENCOUNTER — Emergency Department
Admission: RE | Admit: 2021-11-06 | Discharge: 2021-11-06 | Disposition: A | Discharge status: Home / Self Care | Payer: Medicaid Other | Source: Ambulatory Visit

## 2021-11-06 VITALS — BP 128/77 | HR 77 | Temp 99.1°F | Resp 20 | Ht 60.0 in | Wt 127.0 lb

## 2021-11-06 DIAGNOSIS — J0100 Acute maxillary sinusitis, unspecified: Secondary | ICD-10-CM

## 2021-11-06 DIAGNOSIS — J040 Acute laryngitis: Secondary | ICD-10-CM

## 2021-11-06 MED ORDER — CEFDINIR 300 MG PO CAPS: 300.0000 mg | ORAL_CAPSULE | Freq: Two times a day (BID) | ORAL | 0 refills | Status: AC

## 2021-11-06 MED ORDER — PREDNISONE 20 MG PO TABS: ORAL_TABLET | ORAL | 0 refills | Status: DC

## 2021-11-06 MED ORDER — ALBUTEROL SULFATE HFA 108 (90 BASE) MCG/ACT IN AERS: 1.0000 | INHALATION_SPRAY | Freq: Four times a day (QID) | RESPIRATORY_TRACT | 0 refills | Status: DC | PRN

## 2021-11-06 NOTE — Discharge Instructions (Addendum)
Advised patient to take medication as directed with food to completion.  Advised patient to take Prednisone with first dose of Cefdinir for the next 5 of 7 days.  Encouraged patient to increase daily water intake while taking these medications, preferably 48 ounces per day.

## 2021-11-06 NOTE — Telephone Encounter (Signed)
Pt requesting refill of albuterol inhaler immediately following discharge from [facility] today, stating she was out of this and is asthmatic. Kathi Ludwig, FNP authorizes. Submitted order to pharmacy.
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
## 2021-11-06 NOTE — ED Triage Notes (Signed)
Pt presents to Urgent Care with c/o nasal congestion, cough, L ear fullness x approx 2 weeks. Was dx w/ influenza approx 1.5 weeks ago. Now has laryngitis. Negative home COVID test last night.

## 2022-01-05 ENCOUNTER — Ambulatory Visit: Payer: Medicaid Other | Admitting: Dermatology

## 2022-01-05 ENCOUNTER — Other Ambulatory Visit: Payer: Self-pay

## 2022-01-05 DIAGNOSIS — L600 Ingrowing nail: Secondary | ICD-10-CM

## 2022-01-05 DIAGNOSIS — L814 Other melanin hyperpigmentation: Secondary | ICD-10-CM

## 2022-01-05 DIAGNOSIS — L905 Scar conditions and fibrosis of skin: Secondary | ICD-10-CM

## 2022-01-05 DIAGNOSIS — L731 Pseudofolliculitis barbae: Secondary | ICD-10-CM

## 2022-01-05 NOTE — Patient Instructions (Addendum)
Recommend daily broad spectrum sunscreen SPF 30+ to sun-exposed areas, reapply every 2 hours as needed. Call for new or changing lesions.  Staying in the shade or wearing long sleeves, sun glasses (UVA+UVB protection) and wide brim hats (4-inch brim around the entire circumference of the hat) are also recommended for sun protection.    If You Need Anything After Your Visit  If you have any questions or concerns for your doctor, please call our main line at [phone] and press option 4 to reach your doctor's medical assistant. If no one answers, please leave a voicemail as directed and we will return your call as soon as possible. Messages left after 4 pm will be answered the following business day.   You may also send [place] a message via [place]. We typically respond to MyChart messages within 1-2 business days.  For prescription refills, please ask your pharmacy to contact our office. Our fax number is [phone].  If you have an urgent issue when the clinic is closed that cannot wait until the next business day, you can page your doctor at the number below.    Please note that while we do our best to be available for urgent issues outside of office hours, we are not available 24/7.   If you have an urgent issue and are unable to reach [place], you may choose to seek medical care at your doctor's office, retail clinic, urgent care center, or emergency room.  If you have a medical emergency, please immediately call 911 or go to the emergency department.  Pager Numbers  - Dr. Nehemiah Massed: [phone]  - Dr. Laurence Ferrari: [phone]  - Dr. Nicole Kindred: [phone]  In the event of inclement weather, please call our main line at [phone] for an update on the status of any delays or closures.  Dermatology Medication Tips: Please keep the boxes that topical medications come in in order to help keep track of the instructions about where and how to use these. Pharmacies typically print the medication  instructions only on the boxes and not directly on the medication tubes.   If your medication is too expensive, please contact our office at [phone] option 4 or send [place] a message through [place].   We are unable to tell what your co-pay for medications will be in advance as this is different depending on your insurance coverage. However, we may be able to find a substitute medication at lower cost or fill out paperwork to get insurance to cover a needed medication.   If a prior authorization is required to get your medication covered by your insurance company, please allow [place] 1-2 business days to complete this process.  Drug prices often vary depending on where the prescription is filled and some pharmacies may offer cheaper prices.  The website www.goodrx.com contains coupons for medications through different pharmacies. The prices here do not account for what the cost may be with help from insurance (it may be cheaper with your insurance), but the website can give you the price if you did not use any insurance.  - You can print the associated coupon and take it with your prescription to the pharmacy.  - You may also stop by our office during regular business hours and pick up a GoodRx coupon card.  - If you need your prescription sent electronically to a different pharmacy, notify our office through [facility] or by phone at [phone] option 4.     Si Usted Necesita Algo Despus de Su  Visita  Tambin puede enviarnos un mensaje a travs de MyChart. Por lo general respondemos a los mensajes de MyChart en el transcurso de 1 a 2 das hbiles.  Para renovar recetas, por favor pida a su farmacia que se ponga en contacto con nuestra oficina. Harland Dingwall de fax es [place] [phone].  Si tiene un asunto urgente cuando la clnica est cerrada y que no puede esperar hasta el siguiente da hbil, puede llamar/localizar a su doctor(a) al nmero que aparece a continuacin.   Por  favor, tenga en cuenta que aunque hacemos todo lo posible para estar disponibles para asuntos urgentes fuera del horario de [place], no estamos disponibles las 24 horas del da, los 7 das de la [place].   Si tiene un problema urgente y no puede comunicarse con nosotros, puede optar por buscar atencin mdica  en el consultorio de su doctor(a), en una clnica privada, en un centro de atencin urgente o en una sala de emergencias.  Si tiene Engineering geologist, por favor llame inmediatamente al 911 o vaya a la sala de emergencias.  Nmeros de bper  - Dr. Nehemiah Massed: [phone]  - Dra. Moye: [phone]  - Dra. Nicole Kindred: [phone]  En caso de inclemencias del [place], por favor llame a Johnsie Kindred principal al [phone] para una actualizacin sobre el [place] de cualquier retraso o cierre.  Consejos para la medicacin en dermatologa: Por favor, guarde las cajas en las que vienen los medicamentos de uso tpico para ayudarle a seguir las instrucciones sobre dnde y cmo usarlos. Las farmacias generalmente imprimen las instrucciones del medicamento slo en las cajas y no directamente en los tubos del [place].   Si su medicamento es muy caro, por favor, pngase en contacto con Zigmund Daniel llamando al [phone] y presione la opcin 4 o envenos un mensaje a travs de Pharmacist, community.   No podemos decirle cul ser su copago por los medicamentos por adelantado ya que esto es diferente dependiendo de la cobertura de su seguro. Sin embargo, es posible que podamos encontrar un medicamento sustituto a Electrical engineer un formulario para que el seguro cubra el medicamento que se considera necesario.   Si se requiere una autorizacin previa para que su compaa de seguros [place] su medicamento, por favor permtanos de 1 a 2 das hbiles para completar este proceso.  Los precios de los medicamentos varan con frecuencia dependiendo del Environmental consultant de dnde se surte la receta y alguna farmacias  pueden ofrecer precios ms baratos.  El sitio web www.goodrx.com tiene cupones para medicamentos de Airline pilot. Los precios aqu no tienen en cuenta lo que podra costar con la ayuda del seguro (puede ser ms barato con su seguro), pero el sitio web puede darle el precio si no utiliz Research scientist (physical sciences).  - Puede imprimir el cupn correspondiente y llevarlo con su receta a la farmacia.  - Tambin puede pasar por nuestra oficina durante el horario de atencin regular y Charity fundraiser una tarjeta de cupones de GoodRx.  - Si necesita que su receta se enve electrnicamente a una farmacia diferente, informe a nuestra oficina a travs de MyChart de [place] o por telfono llamando al [phone] y presione la opcin 4.

## 2022-01-05 NOTE — Progress Notes (Signed)
° °  Follow-Up Visit   Subjective  Whitney Gonzalez is a 31 y.o. female who presents for the following: Skin Problem (Patient here today for some pigmentation at left upper arm at scar s/p excision for severe dysplastic nevus. 08/18/2020 was date of excision. ).  The following portions of the chart were reviewed this encounter and updated as appropriate:   Tobacco   Allergies   Meds   Problems   Med Hx   Surg Hx   Fam Hx       Review of Systems:  No other skin or systemic complaints except as noted in HPI or Assessment and Plan.  Objective  Well appearing patient in no apparent distress; mood and affect are within normal limits.  A focused examination was performed including left arm, groin. Relevant physical exam findings are noted in the Assessment and Plan.  Left Upper Arm Slightly hypertrophic scar   Left Upper Arm within scar Few scattered linear tan macules   Pubic Inflamed papules with hair x 3    Assessment & Plan  Scar Left Upper Arm  S/p excision for severe dysplastic nevus  No evidence of recurrence, call clinic for new or changing lesions.   Lentigines Left Upper Arm within scar  Benign-appearing.  Observation.  Call clinic for new or changing lesions.  Recommend daily use of broad spectrum spf 30+ sunscreen to sun-exposed areas.    Ingrown hair Pubic  After prepping with isopropyl alcohol, tip of hair removed from under skin (but not plucked) today  Avoid plucking or close shaving   Return for TBSE, as scheduled.  Graciella Belton, RMA, am acting as scribe for Forest Gleason, MD .  Documentation: I have reviewed the above documentation for accuracy and completeness, and I agree with the above.  Forest Gleason, MD

## 2022-01-12 ENCOUNTER — Encounter: Payer: Self-pay | Admitting: Dermatology

## 2022-01-26 ENCOUNTER — Ambulatory Visit: Payer: Medicaid Other | Admitting: Dermatology

## 2022-02-24 ENCOUNTER — Encounter (HOSPITAL_BASED_OUTPATIENT_CLINIC_OR_DEPARTMENT_OTHER): Payer: Self-pay | Admitting: Obstetrics and Gynecology

## 2022-02-24 ENCOUNTER — Other Ambulatory Visit: Payer: Self-pay

## 2022-02-24 ENCOUNTER — Emergency Department (HOSPITAL_BASED_OUTPATIENT_CLINIC_OR_DEPARTMENT_OTHER)
Admission: EM | Admit: 2022-02-24 | Discharge: 2022-02-24 | Disposition: A | Discharge status: Home / Self Care | Payer: Medicaid Other | Attending: Emergency Medicine | Admitting: Emergency Medicine

## 2022-02-24 ENCOUNTER — Emergency Department (HOSPITAL_BASED_OUTPATIENT_CLINIC_OR_DEPARTMENT_OTHER): Payer: Medicaid Other

## 2022-02-24 DIAGNOSIS — R0602 Shortness of breath: Secondary | ICD-10-CM | POA: Insufficient documentation

## 2022-02-24 DIAGNOSIS — J45909 Unspecified asthma, uncomplicated: Secondary | ICD-10-CM | POA: Insufficient documentation

## 2022-02-24 DIAGNOSIS — Z91040 Latex allergy status: Secondary | ICD-10-CM | POA: Diagnosis not present

## 2022-02-24 DIAGNOSIS — R590 Localized enlarged lymph nodes: Secondary | ICD-10-CM | POA: Diagnosis not present

## 2022-02-24 DIAGNOSIS — R630 Anorexia: Secondary | ICD-10-CM | POA: Diagnosis not present

## 2022-02-24 DIAGNOSIS — J029 Acute pharyngitis, unspecified: Secondary | ICD-10-CM | POA: Diagnosis not present

## 2022-02-24 DIAGNOSIS — Z7951 Long term (current) use of inhaled steroids: Secondary | ICD-10-CM | POA: Insufficient documentation

## 2022-02-24 DIAGNOSIS — R591 Generalized enlarged lymph nodes: Secondary | ICD-10-CM

## 2022-02-24 LAB — URINALYSIS, ROUTINE W REFLEX MICROSCOPIC
Bilirubin Urine: NEGATIVE
Glucose, UA: NEGATIVE mg/dL
Hgb urine dipstick: NEGATIVE
Ketones, ur: NEGATIVE mg/dL
Leukocytes,Ua: NEGATIVE
Nitrite: NEGATIVE
Protein, ur: NEGATIVE mg/dL
Specific Gravity, Urine: 1.015 (ref 1.005–1.030)
pH: 7 (ref 5.0–8.0)

## 2022-02-24 LAB — BASIC METABOLIC PANEL
Anion gap: 7 (ref 5–15)
BUN: 5 mg/dL — ABNORMAL LOW (ref 6–20)
CO2: 28 mmol/L (ref 22–32)
Calcium: 9.7 mg/dL (ref 8.9–10.3)
Chloride: 106 mmol/L (ref 98–111)
Creatinine, Ser: 0.71 mg/dL (ref 0.44–1.00)
GFR, Estimated: >60 mL/min (ref >60)
Glucose, Bld: 94 mg/dL (ref 70–99)
Potassium: 3.9 mmol/L (ref 3.5–5.1)
Sodium: 141 mmol/L (ref 135–145)

## 2022-02-24 LAB — CBC
HCT: 38.1 % (ref 36.0–46.0)
Hemoglobin: 13.2 g/dL (ref 12.0–15.0)
MCH: 30.8 pg (ref 26.0–34.0)
MCHC: 34.6 g/dL (ref 30.0–36.0)
MCV: 89 fL (ref 80.0–100.0)
Platelets: 296 10*3/uL (ref 150–400)
RBC: 4.28 MIL/uL (ref 3.87–5.11)
RDW: 12.1 % (ref 11.5–15.5)
WBC: 6.1 10*3/uL (ref 4.0–10.5)
nRBC: 0 % (ref 0.0–0.2)

## 2022-02-24 LAB — HEPATIC FUNCTION PANEL
ALT: 13 U/L (ref 0–44)
AST: 14 U/L — ABNORMAL LOW (ref 15–41)
Albumin: 4.5 g/dL (ref 3.5–5.0)
Alkaline Phosphatase: 37 U/L — ABNORMAL LOW (ref 38–126)
Bilirubin, Direct: 0.1 mg/dL (ref 0.0–0.2)
Indirect Bilirubin: 0.2 mg/dL — ABNORMAL LOW (ref 0.3–0.9)
Total Bilirubin: 0.3 mg/dL (ref 0.3–1.2)
Total Protein: 7.1 g/dL (ref 6.5–8.1)

## 2022-02-24 LAB — HCG, QUANTITATIVE, PREGNANCY: hCG, Beta Chain, Quant, S: <1 m[IU]/mL (ref <5)

## 2022-02-24 MED ORDER — IOHEXOL 300 MG/ML  SOLN
75.0000 mL | Freq: Once | INTRAMUSCULAR | Status: AC | PRN
  Administered 2022-02-24: 75 mL via INTRAVENOUS

## 2022-02-24 NOTE — ED Notes (Signed)
Patient returned from CT

## 2022-02-24 NOTE — Discharge Instructions (Addendum)
Please follow-up with Dr. Melony Overly, ENT.  His number is [phone]. His address is [address] [place] [address] ?Please return to the ED with any new or worsening symptoms such as inability to swallow, trouble swallowing secretions ?

## 2022-02-24 NOTE — ED Provider Notes (Signed)
?[facility] ?Provider Note ? ? ?CSN: [id number] ?Arrival date & time: 02/24/22  1132 ? ?  ? ?History ? ?Chief Complaint  ?Patient presents with  ? Sore Throat  ? ? ?Whitney Gonzalez is a 31 y.o. female with extensive medical history to include asthma, anemia, anxiety, bipolar 1 disorder.  Patient presents ED for evaluation of sore throat trouble swallowing.  Patient states that last summer she noticed a mass on the left side of her neck that is progressively worsened since this time.  Patient states that it is tender and painful and aggravated by swallowing.  Patient states that she was seen for this complaint by ENT back in 2021 with largely negative work-up.  Patient states that yesterday she noticed a similar mass on the right side of her neck that is causing the same symptoms.  Patient states that she is having trouble swallowing, she is short of breath, she has lost 70 pounds in the last 15 months, she had decreased appetite, she has had hair falling out.  Patient denies drooling, voice change, any fevers, chills, body aches, nausea, vomiting, diarrhea, night sweats. ? ? ?Sore Throat ?Associated symptoms include shortness of breath.  ? ?  ? ?Home Medications ?Prior to Admission medications   ?Medication Sig Start Date End Date Taking? Authorizing Provider  ?albuterol (VENTOLIN HFA) 108 (90 Base) MCG/ACT inhaler Inhale into the lungs every 6 (six) hours as needed for wheezing or shortness of breath.    [provider]  ?albuterol (VENTOLIN HFA) 108 (90 Base) MCG/ACT inhaler Inhale 1-2 puffs into the lungs every 6 (six) hours as needed for wheezing or shortness of breath. 11/06/21   Eliezer Lofts, FNP  ?calcium carbonate (TUMS - DOSED IN MG ELEMENTAL CALCIUM) 500 MG chewable tablet Chew 1 tablet by mouth daily.    [provider]  ?Dextromethorphan HBr (DELSYM) 15 MG TABS Take by mouth.    [provider]  ?dextromethorphan-guaiFENesin (MUCINEX DM)  30-600 MG 12hr tablet Take 1 tablet by mouth 2 (two) times daily.    [provider]  ?diphenhydrAMINE (BENADRYL) 25 mg capsule Take 25 mg by mouth every 6 (six) hours as needed.    [provider]  ?Drospirenone (SLYND) 4 MG TABS Take 1 tablet by mouth daily. 05/31/21   Janet Berlin, MD  ?EPINEPHrine 0.3 mg/0.3 mL IJ SOAJ injection Inject 0.3 mg into the muscle as needed for anaphylaxis.    [provider]  ?famotidine (PEPCID) 10 MG tablet Take 10 mg by mouth as needed for heartburn or indigestion.    [provider]  ?Minocycline HCl Micronized (AMZEEQ) 4 % FOAM QHS to face, over Retin A 09/14/21   Moye, [place], MD  ?oseltamivir (TAMIFLU) 75 MG capsule Take 1 capsule (75 mg total) by mouth every 12 (twelve) hours. 10/26/21   Eliezer Lofts, FNP  ?oxymetazoline (AFRIN) 0.05 % nasal spray Place 1 spray into both nostrils 2 (two) times daily.    [provider]  ?predniSONE (DELTASONE) 20 MG tablet Take 3 tabs PO daily x 5 days. 11/06/21   Eliezer Lofts, FNP  ?rizatriptan (MAXALT) 10 MG tablet 1 tablet at start of migraine. may repeat one additional dose after 2 hours if needed 09/13/10   [provider]  ?Tretinoin Microsphere (RETIN-A MICRO PUMP) 0.06 % GEL Apply qhs to face 09/14/21   Moye, [place], MD  ?escitalopram (LEXAPRO) 5 MG tablet Take 1 tablet (5 mg total) by mouth daily. 04/02/17 05/14/17  De Nurse,  Nadeem, MD  ?lamoTRIgine (LAMICTAL) 25 MG tablet Take 1 tablet (25 mg total) by mouth daily. Take one tablet daily for a week and then start taking 2 tablets. 04/02/17 05/14/17  Merian Capron, MD  ?Norethindrone Acetate-Ethinyl Estrad-FE (LOESTRIN 24 FE) 1-20 MG-MCG(24) tablet Take 1 tablet by mouth daily. 09/03/19 10/13/19  Osborne Oman, MD  ?   ? ?Allergies    ?Azithromycin, Banana, Doxycycline hyclate, Latex, Mango flavor, Other, Propranolol hcl, Shellfish allergy, Allegra [fexofenadine], Estrogens, Metronidazole, Sprintec 28 [norgestimate-eth  estradiol], Tegretol [carbamazepine], Amoxicillin-pot clavulanate, Cephalexin, Cetirizine, Codeine, Penicillins, and Sulfamethoxazole-trimethoprim   ? ?Review of Systems   ?Review of Systems  ?Constitutional:  Positive for appetite change. Negative for chills, diaphoresis and fever.  ?HENT:  Positive for sore throat and trouble swallowing. Negative for drooling and voice change.   ?Respiratory:  Positive for shortness of breath.   ?Gastrointestinal:  Negative for diarrhea, nausea and vomiting.  ?Hematological:  Positive for adenopathy.  ?All other systems reviewed and are negative. ? ?Physical Exam ?Updated Vital Signs ?BP 116/76   Pulse 74   Temp 98.4 ?F (36.9 ?C)   Resp 17   Ht 5' (1.524 m)   Wt 57.6 kg   LMP 02/10/2022 (Approximate)   SpO2 100%   BMI 24.80 kg/m?  ?Physical Exam ?Vitals and nursing note reviewed.  ?Constitutional:   ?   General: She is not in acute distress. ?   Appearance: She is well-developed. She is not ill-appearing, toxic-appearing or diaphoretic.  ?HENT:  ?   Head: Normocephalic and atraumatic.  ?   Nose: Nose normal. No congestion or rhinorrhea.  ?   Mouth/Throat:  ?   Mouth: Mucous membranes are moist.  ?   Pharynx: Uvula midline. Posterior oropharyngeal erythema present. No oropharyngeal exudate or uvula swelling.  ?   Tonsils: No tonsillar exudate or tonsillar abscesses. 0 on the right. 0 on the left.  ?Eyes:  ?   Conjunctiva/sclera: Conjunctivae normal.  ?   Pupils: Pupils are equal, round, and reactive to light.  ?Cardiovascular:  ?   Rate and Rhythm: Normal rate and regular rhythm.  ?Pulmonary:  ?   Effort: Pulmonary effort is normal.  ?   Breath sounds: Normal breath sounds. No wheezing.  ?Abdominal:  ?   General: Bowel sounds are normal.  ?   Palpations: Abdomen is soft.  ?   Tenderness: There is no abdominal tenderness.  ?Musculoskeletal:  ?   Cervical back: Normal range of motion and neck supple. No tenderness.  ?Lymphadenopathy:  ?   Cervical: Cervical adenopathy  present.  ?   Right cervical: Superficial cervical adenopathy and posterior cervical adenopathy present. No deep cervical adenopathy. ?   Left cervical: Superficial cervical adenopathy present. No deep or posterior cervical adenopathy.  ?Skin: ?   General: Skin is warm and dry.  ?   Capillary Refill: Capillary refill takes less than 2 seconds.  ?Neurological:  ?   Mental Status: She is alert and oriented to person, place, and time.  ? ? ?ED Results / Procedures / Treatments   ?Labs ?(all labs ordered are listed, but only abnormal results are displayed) ?Labs Reviewed  ?BASIC METABOLIC PANEL - Abnormal; Notable for the following components:  ?    Result Value  ? BUN 5 (*)   ? All other components within normal limits  ?HEPATIC FUNCTION PANEL - Abnormal; Notable for the following components:  ? AST 14 (*)   ? Alkaline Phosphatase 37 (*)   ?  Indirect Bilirubin 0.2 (*)   ? All other components within normal limits  ?CBC  ?URINALYSIS, ROUTINE W REFLEX MICROSCOPIC  ?HCG, QUANTITATIVE, PREGNANCY  ? ? ?EKG ?None ? ?Radiology ?No results found. ? ?Procedures ?Procedures  ? ? ?Medications Ordered in ED ?Medications  ?iohexol (OMNIPAQUE) 300 MG/ML solution 75 mL (75 mLs Intravenous Contrast Given 02/24/22 1458)  ? ? ?ED Course/ Medical Decision Making/ A&P ?  ?                        ?Medical Decision Making ?Amount and/or Complexity of Data Reviewed ?Labs: ordered. ?Radiology: ordered. ? ?Risk ?Prescription drug management. ? ? ?31 year old female presents ED for evaluation of right-sided neck mass.  Please see HPI for further details. ? ?On examination, the patient is afebrile, nontachycardic, not hypoxic.  The patient lung sounds are clear bilaterally.  Patient's abdomen is soft compressible all 4 quadrants.  Patient nontoxic in appearance. ?Patient posterior oropharynx is erythematous.  She does not have any tonsillar swelling, her uvula is midline, she is handling secretions appropriately.  The patient has 100% oxygen  saturation on room air.  No signs of RPA, PTA. ? ?Patient has obvious cervical lymphadenopathy on right and left.  Patient has been seen by ENT with the plan to perform outpatient CT scan at patient follow-up.  Patient reports t

## 2022-02-24 NOTE — ED Triage Notes (Addendum)
Patient reports to the ER for throat swelling and swollen lymph nodes. Patient reports she first noted some swelling in 2021 and feels like it has gotten bigger. Patient reports she saw ENT in November. Patient reports that yesterday she noted another swollen lymph node on the right side of her neck and she feels like it is affecting her breathing. Patient has also had weight loss 70lbs in the last year and she has had decreased appetite and lethargy.  ? ?Patient was sent by ENT with the intention to get a CT scan of the neck. She is very concerned about it potentially not being completed. ?

## 2022-04-11 ENCOUNTER — Ambulatory Visit (INDEPENDENT_AMBULATORY_CARE_PROVIDER_SITE_OTHER): Payer: Medicaid Other | Admitting: Family Medicine

## 2022-04-11 ENCOUNTER — Encounter: Payer: Self-pay | Admitting: Family Medicine

## 2022-04-11 VITALS — BP 114/75 | HR 80

## 2022-04-11 DIAGNOSIS — G43109 Migraine with aura, not intractable, without status migrainosus: Secondary | ICD-10-CM | POA: Insufficient documentation

## 2022-04-11 DIAGNOSIS — Z3201 Encounter for pregnancy test, result positive: Secondary | ICD-10-CM

## 2022-04-11 DIAGNOSIS — F419 Anxiety disorder, unspecified: Secondary | ICD-10-CM | POA: Insufficient documentation

## 2022-04-11 DIAGNOSIS — N96 Recurrent pregnancy loss: Secondary | ICD-10-CM

## 2022-04-11 DIAGNOSIS — K219 Gastro-esophageal reflux disease without esophagitis: Secondary | ICD-10-CM

## 2022-04-11 DIAGNOSIS — G43909 Migraine, unspecified, not intractable, without status migrainosus: Secondary | ICD-10-CM | POA: Insufficient documentation

## 2022-04-11 DIAGNOSIS — L723 Sebaceous cyst: Secondary | ICD-10-CM | POA: Diagnosis not present

## 2022-04-11 DIAGNOSIS — Z3200 Encounter for pregnancy test, result unknown: Secondary | ICD-10-CM

## 2022-04-11 HISTORY — DX: Migraine, unspecified, not intractable, without status migrainosus: G43.909

## 2022-04-11 HISTORY — DX: Gastro-esophageal reflux disease without esophagitis: K21.9

## 2022-04-11 NOTE — Progress Notes (Signed)
Patient reports recent beta HCG of 6 at [facility] on 04/03/22. Reports she was concerned for pregnancy due to increased hunger in the morning, dull cramping in abdomen, and "stretching feelings." LMP 03/24/22. Recently became sexually active on 03/17/22 for the first time in 6 months. Not currently using birth control.  ? ?Medications reviewed. Maxalt last taken before 04/03/22. Pt reports discontinuing Maxalt and Afrin due to pregnancy.  ? Dione Plover, MD to bedside for visit.  ? Apolonio Schneiders RN ?04/11/22 ?
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
## 2022-04-12 ENCOUNTER — Encounter: Payer: Self-pay | Admitting: Family Medicine

## 2022-04-12 DIAGNOSIS — Z3201 Encounter for pregnancy test, result positive: Secondary | ICD-10-CM

## 2022-04-12 DIAGNOSIS — L723 Sebaceous cyst: Secondary | ICD-10-CM | POA: Insufficient documentation

## 2022-04-12 HISTORY — DX: Encounter for pregnancy test, result positive: Z32.01

## 2022-04-12 HISTORY — DX: Sebaceous cyst: L72.3

## 2022-04-12 LAB — BETA HCG QUANT (REF LAB): hCG Quant: <1 m[IU]/mL

## 2022-04-12 NOTE — Assessment & Plan Note (Signed)
Does not appear c/w HSV. No erythema, no ulceration. Reviewed pictures of typical HSV and asked her to return for swab if develops this appearance but much more consistent with ingrown hair or developing comedone.  ?

## 2022-04-12 NOTE — Assessment & Plan Note (Signed)
Outside report of positive HCG, will obtain today to rule out pregnancy.  ?

## 2022-04-12 NOTE — Assessment & Plan Note (Signed)
Neg APS workup in the past. Reassured patient that given she has had a live birth I am less concerned. Can take baby ASA if desired as benefits likely outweigh any small risks.  ?

## 2022-07-03 ENCOUNTER — Encounter (HOSPITAL_BASED_OUTPATIENT_CLINIC_OR_DEPARTMENT_OTHER): Payer: Self-pay | Admitting: Emergency Medicine

## 2022-07-03 ENCOUNTER — Other Ambulatory Visit: Payer: Self-pay

## 2022-07-03 ENCOUNTER — Emergency Department (HOSPITAL_BASED_OUTPATIENT_CLINIC_OR_DEPARTMENT_OTHER)
Admission: EM | Admit: 2022-07-03 | Discharge: 2022-07-03 | Disposition: A | Discharge status: Home / Self Care | Payer: Medicaid Other | Attending: Emergency Medicine | Admitting: Emergency Medicine

## 2022-07-03 DIAGNOSIS — Z91040 Latex allergy status: Secondary | ICD-10-CM | POA: Insufficient documentation

## 2022-07-03 DIAGNOSIS — M545 Low back pain, unspecified: Secondary | ICD-10-CM | POA: Insufficient documentation

## 2022-07-03 DIAGNOSIS — R350 Frequency of micturition: Secondary | ICD-10-CM | POA: Diagnosis present

## 2022-07-03 LAB — URINALYSIS, ROUTINE W REFLEX MICROSCOPIC
Bilirubin Urine: NEGATIVE
Glucose, UA: NEGATIVE mg/dL
Hgb urine dipstick: NEGATIVE
Ketones, ur: NEGATIVE mg/dL
Leukocytes,Ua: NEGATIVE
Nitrite: NEGATIVE
Protein, ur: NEGATIVE mg/dL
Specific Gravity, Urine: 1.009 (ref 1.005–1.030)
pH: 5.5 (ref 5.0–8.0)

## 2022-07-03 LAB — PREGNANCY, URINE: Preg Test, Ur: NEGATIVE

## 2022-07-03 MED ORDER — PHENAZOPYRIDINE HCL 200 MG PO TABS: 200.0000 mg | ORAL_TABLET | Freq: Three times a day (TID) | ORAL | 0 refills | Status: DC

## 2022-07-03 NOTE — ED Triage Notes (Signed)
Pt here from home with urinary frequency and some back , hx of utis , symptoms just started today

## 2022-07-03 NOTE — Discharge Instructions (Signed)
Please read and follow all provided instructions.  Your diagnoses today include:  1. Urinary frequency     Tests performed today include: Urine test - dip stick does not suggest infection today Pregnancy test - negative Urine culture - pending, you will be notified if you a positive culture for infection Vital signs. See below for your results today.   Medications prescribed:  Pyridium - medication for urinary tract infection symptoms.   This medication will turn your urine orange. This is normal.   Please use over-the-counter NSAID medications (ibuprofen, naproxen) as directed on the packaging for pain if you do not have any reasons not to take these medications just as weak kidneys or a history of bleeding in your stomach or gut.   Home care instructions:  Follow any educational materials contained in this packet.  Follow-up instructions: Please follow-up with your primary care provider in 3 days if symptoms are not resolved for further evaluation of your symptoms.  Return instructions:  Please return to the Emergency Department if you experience worsening symptoms.  Return with fever, worsening pain, persistent vomiting, worsening pain in your back.  Please return if you have any other emergent concerns.  Additional Information:  Your vital signs today were: BP 138/87   Pulse 81   Temp 98.3 F (36.8 C) (Oral)   Resp 18   Ht 5' (1.524 m)   Wt 57.2 kg   SpO2 100%   BMI 24.61 kg/m  If your blood pressure (BP) was elevated above 135/85 this visit, please have this repeated by your doctor within one month. --------------

## 2022-07-03 NOTE — ED Provider Notes (Signed)
[facility] EMERGENCY DEPT Provider Note   CSN: [id number] Arrival date & time: 07/03/22  1512     History  Chief Complaint  Patient presents with   Flank Pain    Whitney Gonzalez is a 31 y.o. female.  Patient with history of UTI presents to the emergency department for evaluation of right lower back pain, urinary frequency, odor to the urine starting after awaking this morning.  Patient states that her symptoms reminded her of when she had a kidney infection in the past, prompting emergency department visit.  No fevers or vomiting.  She denies vaginal bleeding or discharge.  No dysuria or hematuria.  Back pain is not made worse with movement and patient does not feel that this is likely muscular in nature based on how she feels.       Home Medications Prior to Admission medications   Medication Sig Start Date End Date Taking? Authorizing Provider  albuterol (VENTOLIN HFA) 108 (90 Base) MCG/ACT inhaler Inhale 1-2 puffs into the lungs every 6 (six) hours as needed for wheezing or shortness of breath. 11/06/21   Eliezer Lofts, FNP  calcium carbonate (TUMS - DOSED IN MG ELEMENTAL CALCIUM) 500 MG chewable tablet Chew 1 tablet by mouth daily.    [provider]  diphenhydrAMINE (BENADRYL) 25 mg capsule Take 25 mg by mouth every 6 (six) hours as needed.    [provider]  EPINEPHrine 0.3 mg/0.3 mL IJ SOAJ injection Inject 0.3 mg into the muscle as needed for anaphylaxis.    [provider]  Prenatal Vit-Fe Fumarate-FA (MULTIVITAMIN-PRENATAL) 27-0.8 MG TABS tablet Take 1 tablet by mouth daily at 12 noon.    [provider]  rizatriptan (MAXALT) 10 MG tablet 1 tablet at start of migraine. may repeat one additional dose after 2 hours if needed 09/13/10   [provider]  escitalopram (LEXAPRO) 5 MG tablet Take 1 tablet (5 mg total) by mouth daily. 04/02/17 05/14/17  Merian Capron, MD  lamoTRIgine (LAMICTAL) 25 MG tablet Take 1  tablet (25 mg total) by mouth daily. Take one tablet daily for a week and then start taking 2 tablets. 04/02/17 05/14/17  Merian Capron, MD  Norethindrone Acetate-Ethinyl Estrad-FE (LOESTRIN 24 FE) 1-20 MG-MCG(24) tablet Take 1 tablet by mouth daily. 09/03/19 10/13/19  Anyanwu, Sallyanne Havers, MD      Allergies    Azithromycin, Banana, Doxycycline hyclate, Latex, Mango flavor, Other, Propranolol hcl, Shellfish allergy, Allegra [fexofenadine], Estrogens, Metronidazole, Sprintec 28 [norgestimate-eth estradiol], Tegretol [carbamazepine], Amoxicillin-pot clavulanate, Cephalexin, Cetirizine, Codeine, Penicillins, and Sulfamethoxazole-trimethoprim    Review of Systems   Review of Systems  Physical Exam Updated Vital Signs BP 138/87   Pulse 81   Temp 98.3 F (36.8 C) (Oral)   Resp 18   Ht 5' (1.524 m)   Wt 57.2 kg   SpO2 100%   BMI 24.61 kg/m  Physical Exam Vitals and nursing note reviewed.  Constitutional:      Appearance: She is well-developed.  HENT:     Head: Normocephalic and atraumatic.  Eyes:     Conjunctiva/sclera: Conjunctivae normal.  Pulmonary:     Effort: No respiratory distress.  Abdominal:     Tenderness: There is abdominal tenderness. There is no right CVA tenderness, left CVA tenderness, guarding or rebound.     Comments: Mild suprapubic tenderness.  Musculoskeletal:     Cervical back: Normal range of motion and neck supple.  Skin:    General: Skin is warm and dry.  Neurological:  Mental Status: She is alert.     ED Results / Procedures / Treatments   Labs (all labs ordered are listed, but only abnormal results are displayed) Labs Reviewed  URINALYSIS, ROUTINE W REFLEX MICROSCOPIC - Abnormal; Notable for the following components:      Result Value   Color, Urine COLORLESS (*)    All other components within normal limits  URINE CULTURE  PREGNANCY, URINE    EKG None  Radiology No results found.  Procedures Procedures    Medications Ordered in  ED Medications - No data to display  ED Course/ Medical Decision Making/ A&P    Patient seen and examined. History obtained directly from patient. Work-up including labs, imaging, EKG ordered in triage, if performed, were reviewed.    Labs/EKG: Independently reviewed and interpreted.  This included: UA negative, pregnancy negative.  Imaging: None ordered  Medications/Fluids: None ordered  Most recent vital signs reviewed and are as follows: BP 138/87   Pulse 81   Temp 98.3 F (36.8 C) (Oral)   Resp 18   Ht 5' (1.524 m)   Wt 57.2 kg   SpO2 100%   BMI 24.61 kg/m   Initial impression: Increased frequency and suprapubic pain, negative dipstick  Plan: Will add on urine culture, treat with Azo and have patient take OTC ibuprofen or Tylenol as desired.  Patient has multiple allergies.  States that she has tolerated Macrobid in the past.  Home treatment plan: Monitor symptoms  Return instructions discussed with patient: Return with worsening pain, fever, vomiting  Follow-up instructions discussed with patient: Follow-up with PCP in 1 week if not better.                          Medical Decision Making Amount and/or Complexity of Data Reviewed Labs: ordered.   Patient with increased frequency and lower back pain today.  UA is reassuring.  Patient is negative.  No vaginal symptoms.  We will give trial of Pyridium.  Urine culture sent.  I would treat her symptoms if positive.  I have low concern for sepsis or pyelonephritis at this time.  Low concern for cervicitis, vaginitis.        Final Clinical Impression(s) / ED Diagnoses Final diagnoses:  Urinary frequency    Rx / DC Orders ED Discharge Orders          Ordered    phenazopyridine (PYRIDIUM) 200 MG tablet  3 times daily        07/03/22 1748              Carlisle Cater, PA-C 07/03/22 1749    Gareth Morgan, MD 07/05/22 [phone]

## 2022-07-06 LAB — URINE CULTURE: Culture: 9000 — AB

## 2022-07-07 ENCOUNTER — Telehealth: Payer: Self-pay | Admitting: *Deleted

## 2022-07-07 NOTE — Telephone Encounter (Signed)
Post ED Visit - Positive Culture Follow-up: Successful Patient Follow-Up  Culture assessed and recommendations reviewed by:  '[]'$  Elenor Quinones, Pharm.D. '[]'$  Heide Guile, Pharm.D., BCPS AQ-ID '[]'$  Parks Neptune, Pharm.D., BCPS '[]'$  Alycia Rossetti, Pharm.D., BCPS '[]'$  [place], Pharm.D., BCPS, AAHIVP '[]'$  Legrand Como, Pharm.D., BCPS, AAHIVP '[]'$  Salome Arnt, PharmD, BCPS '[]'$  Johnnette Gourd, PharmD, BCPS '[]'$  Hughes Better, PharmD, BCPS '[]'$  Leeroy Cha, PharmD  Positive urine culture  '[x]'$  Patient discharged without antimicrobial prescription and treatment is now indicated '[]'$  Organism is resistant to prescribed ED discharge antimicrobial '[]'$  Patient with positive blood cultures  Changes discussed with ED provider: Isla Pence, MD Will need Nitrofurantoin '100mg'$  BID x 5 days if symptoms present, thinks she may be pregnant so will call back in a few days.   Contacted patient, date 07/07/2022, time [place] 07/07/2022, 11:53 AM

## 2022-07-07 NOTE — Progress Notes (Addendum)
ED Antimicrobial Stewardship Positive Culture Follow Up   Whitney Gonzalez is an 31 y.o. female who presented to [facility] on 07/03/2022 with a chief complaint of flank pain and urinary frequency. UA unremarkable. Patient discharged with Azo.   Chief Complaint  Patient presents with   Flank Pain   Recent Results (from the past 720 hour(s))  Urine Culture     Status: Abnormal   Collection Time: 07/03/22  5:45 PM   Specimen: Urine, Suprapubic  Result Value Ref Range Status   Specimen Description   Final    URINE, SUPRAPUBIC Performed at [facility], [address], [place], [place] [address]    Special Requests   Final    NONE Performed at [facility], [place], [place] [address]    Culture 9,000 COLONIES/mL STAPHYLOCOCCUS HOMINIS (A)  Final   Report Status 07/06/2022 FINAL  Final   Organism ID, Bacteria STAPHYLOCOCCUS HOMINIS (A)  Final      Susceptibility   Staphylococcus hominis - MIC*    CIPROFLOXACIN <=0.5 SENSITIVE Sensitive     GENTAMICIN <=0.5 SENSITIVE Sensitive     NITROFURANTOIN <=16 SENSITIVE Sensitive     OXACILLIN <=0.25 SENSITIVE Sensitive     TETRACYCLINE <=1 SENSITIVE Sensitive     VANCOMYCIN 1 SENSITIVE Sensitive     TRIMETH/SULFA 80 RESISTANT Resistant     CLINDAMYCIN >=8 RESISTANT Resistant     RIFAMPIN <=0.5 SENSITIVE Sensitive     Inducible Clindamycin NEGATIVE Sensitive     * 9,000 COLONIES/mL STAPHYLOCOCCUS HOMINIS   '[x]'$  Patient discharged originally without antimicrobial agent. Call pt to f/u urinary symptoms. If sx present, prescribe nitrofurantoin '100mg'$  BID PO x 5 days. If no sx, no antibiotics.   ED Provider: Isla Pence, MD    Billey Gosling, PharmD PGY1 Pharmacy Resident 8/4/20239:16 AM  Clinical Pharmacist Monday - Friday phone -  [phone] Saturday - Sunday phone - [phone]

## 2022-07-09 ENCOUNTER — Telehealth (HOSPITAL_BASED_OUTPATIENT_CLINIC_OR_DEPARTMENT_OTHER): Payer: Self-pay | Admitting: Emergency Medicine

## 2022-07-09 MED ORDER — CIPROFLOXACIN HCL 500 MG PO TABS: 500.0000 mg | ORAL_TABLET | Freq: Two times a day (BID) | ORAL | 0 refills | Status: DC

## 2022-07-09 NOTE — ED Notes (Signed)
Medication called into [facility] pharmacy, pt notified

## 2022-07-09 NOTE — ED Notes (Signed)
Pt called requesting the abx she declined last week d/t possible pregnancy. Pt confirmed she is not pregnant and after reviewing her MyChart results would like to get the abx prescription. Secure chat sent to [facility]

## 2022-07-09 NOTE — ED Notes (Signed)
Pt contacted and made aware new orders were placed. Written order from [facility] for 500 mg Cipro BID. Has been called in to pharmacy.

## 2022-11-23 ENCOUNTER — Other Ambulatory Visit: Payer: Self-pay | Admitting: Family Medicine

## 2022-11-23 DIAGNOSIS — R599 Enlarged lymph nodes, unspecified: Secondary | ICD-10-CM

## 2022-12-07 ENCOUNTER — Ambulatory Visit
Admission: RE | Admit: 2022-12-07 | Discharge: 2022-12-07 | Disposition: A | Discharge status: Home / Self Care | Payer: Medicaid Other | Source: Ambulatory Visit | Attending: Family Medicine | Admitting: Family Medicine

## 2022-12-07 DIAGNOSIS — R599 Enlarged lymph nodes, unspecified: Secondary | ICD-10-CM

## 2022-12-26 ENCOUNTER — Ambulatory Visit: Payer: Medicaid Other | Admitting: Family Medicine

## 2022-12-26 ENCOUNTER — Other Ambulatory Visit: Payer: Self-pay

## 2022-12-26 ENCOUNTER — Encounter: Payer: Self-pay | Admitting: Family Medicine

## 2022-12-26 VITALS — BP 122/86 | HR 80 | Ht 60.0 in | Wt 130.2 lb

## 2022-12-26 DIAGNOSIS — N938 Other specified abnormal uterine and vaginal bleeding: Secondary | ICD-10-CM

## 2022-12-26 HISTORY — DX: Other specified abnormal uterine and vaginal bleeding: N93.8

## 2022-12-26 NOTE — Assessment & Plan Note (Signed)
Unclear etiology, benign exam. Discussed my leading possibilities at present are structural (polyp, fibroid, etc.) vs anovulatory bleeding. Will obtain TVUS and also instructed patient to use ovulation prediction kits to rule out anovulatory bleeding in her next cycle.
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
## 2022-12-27 ENCOUNTER — Telehealth: Payer: Self-pay | Admitting: *Deleted

## 2022-12-27 ENCOUNTER — Encounter: Payer: Self-pay | Admitting: Family Medicine

## 2022-12-27 NOTE — Telephone Encounter (Signed)
Pt left VM message stating that she took an ovulation predictor test at home and it showed positive that she is ovulating. She further stated that she was asked to call back with the information. She was seen in office on 1/23 by Dr. Dione Plover. I returned pt's call and thanked her for letting us know. Message sent to Dr. Dione Plover.

## 2023-01-01 ENCOUNTER — Ambulatory Visit (HOSPITAL_BASED_OUTPATIENT_CLINIC_OR_DEPARTMENT_OTHER): Payer: Medicaid Other

## 2023-02-07 ENCOUNTER — Ambulatory Visit (HOSPITAL_BASED_OUTPATIENT_CLINIC_OR_DEPARTMENT_OTHER)
Admission: RE | Admit: 2023-02-07 | Discharge: 2023-02-07 | Disposition: A | Discharge status: Home / Self Care | Payer: Medicaid Other | Source: Ambulatory Visit | Attending: Family Medicine | Admitting: Family Medicine

## 2023-02-07 DIAGNOSIS — N938 Other specified abnormal uterine and vaginal bleeding: Secondary | ICD-10-CM | POA: Diagnosis present

## 2023-02-12 ENCOUNTER — Encounter: Payer: Self-pay | Admitting: Family Medicine

## 2023-02-26 ENCOUNTER — Ambulatory Visit
Admission: EM | Admit: 2023-02-26 | Discharge: 2023-02-26 | Disposition: A | Discharge status: Home / Self Care | Payer: Medicaid Other | Attending: Family Medicine | Admitting: Family Medicine

## 2023-02-26 ENCOUNTER — Encounter: Payer: Self-pay | Admitting: *Deleted

## 2023-02-26 DIAGNOSIS — Z91040 Latex allergy status: Secondary | ICD-10-CM | POA: Diagnosis not present

## 2023-02-26 MED ORDER — ALBUTEROL SULFATE HFA 108 (90 BASE) MCG/ACT IN AERS: 1.0000 | INHALATION_SPRAY | Freq: Four times a day (QID) | RESPIRATORY_TRACT | 0 refills | Status: DC | PRN

## 2023-02-26 MED ORDER — METHYLPREDNISOLONE SODIUM SUCC 125 MG IJ SOLR
80.0000 mg | Freq: Once | INTRAMUSCULAR | Status: AC
  Administered 2023-02-26: 80 mg via INTRAMUSCULAR

## 2023-02-26 MED ORDER — PREDNISONE 50 MG PO TABS: ORAL_TABLET | ORAL | 0 refills | Status: DC

## 2023-02-26 NOTE — ED Triage Notes (Signed)
Patient has a history of latex allergy. She was exposed to latex balloons yesterday and reported lip swelling and tingling yesterday. She awoke this AM with her throat feeling tight, CP, hoarseness. H/o asthma. Her father who is a Software engineer, gave her benadryl and Pepcid, she also used her albuterol inhaler.  Currently all vitals are WNL, 02 sat 100%, lungs are CTA and her airway is open on visualization.
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
## 2023-02-26 NOTE — Discharge Instructions (Signed)
Take prednisone as needed.  One tab ASAP with allergic reaction

## 2023-02-27 ENCOUNTER — Telehealth: Payer: Self-pay | Admitting: *Deleted

## 2023-02-27 NOTE — Telephone Encounter (Signed)
I called patient per request from Dr. Dione Plover. She reports she would prefer to see one of his partners who specialize in gynecology. She also wants him to know she saw a fertility doctor in the past and they did dye test that did not show any blockages. She states her symptoms worsened since then. She also reports her last period was 02/07/23-02/13/23.  Whitney Gonzalez

## 2023-03-27 ENCOUNTER — Encounter: Payer: Medicaid Other | Admitting: Family Medicine

## 2023-06-25 ENCOUNTER — Ambulatory Visit
Admission: EM | Admit: 2023-06-25 | Discharge: 2023-06-25 | Disposition: A | Discharge status: Home / Self Care | Payer: Medicaid Other | Attending: Physician Assistant | Admitting: Physician Assistant

## 2023-06-25 DIAGNOSIS — J4521 Mild intermittent asthma with (acute) exacerbation: Secondary | ICD-10-CM | POA: Diagnosis not present

## 2023-06-25 MED ORDER — PREDNISONE 10 MG (21) PO TBPK: ORAL_TABLET | ORAL | 0 refills | Status: DC

## 2023-06-25 MED ORDER — METHYLPREDNISOLONE ACETATE 80 MG/ML IJ SUSP
80.0000 mg | Freq: Once | INTRAMUSCULAR | Status: AC
  Administered 2023-06-25: 80 mg via INTRAMUSCULAR

## 2023-06-25 MED ORDER — ALBUTEROL SULFATE HFA 108 (90 BASE) MCG/ACT IN AERS
1.0000 | INHALATION_SPRAY | Freq: Four times a day (QID) | RESPIRATORY_TRACT | 0 refills | Status: AC | PRN
Start: 2023-06-25 — End: ?

## 2023-06-25 MED ORDER — ALBUTEROL SULFATE (2.5 MG/3ML) 0.083% IN NEBU: 2.5000 mg | INHALATION_SOLUTION | Freq: Four times a day (QID) | RESPIRATORY_TRACT | 0 refills | Status: DC | PRN

## 2023-06-25 NOTE — Discharge Instructions (Signed)
We gave an injection of steroids today.  Please start prednisone tomorrow (06/26/2023).  Do not take NSAIDs with this medication including aspirin, ibuprofen/Advil, naproxen/Aleve.  I have sent in refills of your albuterol nebulizer solution and inhaler.  Use this every 4-6 hours as needed.  As we discussed, if your symptoms persist you may benefit from a maintenance medication such as Symbicort.  Follow-up with your primary care to determine if this is appropriate.  If anything worsens and you have persistent shortness of breath, cough, nausea/vomiting you should be seen immediately.

## 2023-06-25 NOTE — ED Triage Notes (Signed)
Pt presents with c/o a cough x 2 weeks. Pt states the cough starts when breathing in or talking. Pt has used her inhaler and nebulizer treatment, states it has not helped.   Pt states her throat is irritated from coughing a lot.
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
## 2023-09-11 ENCOUNTER — Ambulatory Visit
Admission: EM | Admit: 2023-09-11 | Discharge: 2023-09-11 | Disposition: A | Discharge status: Home / Self Care | Payer: Medicaid Other | Attending: Family Medicine | Admitting: Family Medicine

## 2023-09-11 ENCOUNTER — Encounter: Payer: Self-pay | Admitting: Emergency Medicine

## 2023-09-11 DIAGNOSIS — J069 Acute upper respiratory infection, unspecified: Secondary | ICD-10-CM | POA: Diagnosis not present

## 2023-09-11 DIAGNOSIS — J4521 Mild intermittent asthma with (acute) exacerbation: Secondary | ICD-10-CM

## 2023-09-11 MED ORDER — PREDNISONE 50 MG PO TABS: ORAL_TABLET | ORAL | 0 refills | Status: DC

## 2023-09-11 NOTE — ED Triage Notes (Signed)
Patient c/o productive cough, nasal drainage, congestion since yesterday.  This morning chest tightness, voice hoarseness, discomfort when breathing.  Patient did have COVID several weeks ago.  Patient has been doing saline rinses to help with drainage.

## 2023-09-11 NOTE — Discharge Instructions (Addendum)
Take the prednisone once a day for 5 days as directed Drink lots of water May take over-the-counter cough and cold medicines See your doctor if not improving by the end of the week
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
## 2023-09-15 ENCOUNTER — Encounter: Payer: Self-pay | Admitting: Emergency Medicine

## 2023-09-15 ENCOUNTER — Ambulatory Visit
Admission: EM | Admit: 2023-09-15 | Discharge: 2023-09-15 | Disposition: A | Discharge status: Home / Self Care | Payer: Medicaid Other | Attending: Family Medicine | Admitting: Family Medicine

## 2023-09-15 DIAGNOSIS — J4521 Mild intermittent asthma with (acute) exacerbation: Secondary | ICD-10-CM | POA: Diagnosis not present

## 2023-09-15 DIAGNOSIS — J209 Acute bronchitis, unspecified: Secondary | ICD-10-CM | POA: Diagnosis not present

## 2023-09-15 MED ORDER — LEVOFLOXACIN 500 MG PO TABS: 500.0000 mg | ORAL_TABLET | Freq: Every day | ORAL | 0 refills | Status: DC

## 2023-09-15 MED ORDER — BENZONATATE 200 MG PO CAPS: 200.0000 mg | ORAL_CAPSULE | Freq: Three times a day (TID) | ORAL | 0 refills | Status: DC | PRN

## 2023-09-15 NOTE — Discharge Instructions (Signed)
Make sure you are drinking lots of water Take the antibiotic once a day for 7 days For your cough take a combination of Tessalon 2-3 times a day and a DM product like Mucinex DM or Delsym See your PCP if not improving by next week

## 2023-09-15 NOTE — ED Triage Notes (Signed)
Patient states she doesn't feel any better.  Patient was seen on 09/11/2023 and was given Prednisone, not any better.  Chest still feels tight, productive cough, hoarseness.  Afebrile.
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
## 2023-12-05 NOTE — L&D Delivery Note (Signed)
 [facility] Delivery Note  Whitney Gonzalez is a 33 y.o. [id number] s/p NVD at [redacted]w[redacted]d. She was admitted for IOL FGR.   ROM: 1h 23m with clear fluid GBS Status: Negative/-- (11/25 1110)  Maximum Maternal Temperature: 98.3  Labor Progress: Initial SVE: 1/thick/posterior. AROM, Pitocin , Cytotec , and IP Foley. She then progressed to complete.   Delivery Date/Time: 11/07/2024 2:27 AM Delivery: Called to room and patient was complete and pushing. Head delivered straight occiput anterior. nuchal cord present x1, reduced on the perineum. Shoulder and body delivered in usual fashion. Infant with spontaneous cry, placed on mother's abdomen, dried and stimulated. Cord clamped x 2 after cord was white nearest the infant, and cut by father. Cord blood collected. Placenta delivered spontaneously with gentle cord traction. Fundus firm with massage and Pitocin . Labia, perineum, vagina, and cervix were inspected and repaired.  Placenta:  spontaneous Intact, sent to pathology Complications:variable decelerations Lacerations: 1st degree QBL: Analgesia: Epidural  Newborn Data: Marylu! Living status:Living Gender:Female Apgars:9 ,9  Weight:5 lb 5.7 oz (2.43 kg)           Whitney Maier, DO [facility] [employer]

## 2024-02-06 ENCOUNTER — Ambulatory Visit
Admission: EM | Admit: 2024-02-06 | Discharge: 2024-02-06 | Disposition: A | Discharge status: Home / Self Care | Attending: Emergency Medicine | Admitting: Emergency Medicine

## 2024-02-06 ENCOUNTER — Other Ambulatory Visit: Payer: Self-pay

## 2024-02-06 DIAGNOSIS — M545 Low back pain, unspecified: Secondary | ICD-10-CM | POA: Diagnosis not present

## 2024-02-06 LAB — POCT URINALYSIS DIP (MANUAL ENTRY)
Bilirubin, UA: NEGATIVE
Blood, UA: NEGATIVE
Glucose, UA: NEGATIVE mg/dL
Ketones, POC UA: NEGATIVE mg/dL
Leukocytes, UA: NEGATIVE
Nitrite, UA: NEGATIVE
Protein Ur, POC: NEGATIVE mg/dL
Spec Grav, UA: 1.025 (ref 1.010–1.025)
Urobilinogen, UA: 0.2 U/dL
pH, UA: 6.5 (ref 5.0–8.0)

## 2024-02-06 MED ORDER — IBUPROFEN 800 MG PO TABS: 800.0000 mg | ORAL_TABLET | Freq: Three times a day (TID) | ORAL | 0 refills | Status: DC

## 2024-02-06 MED ORDER — CYCLOBENZAPRINE HCL 10 MG PO TABS: 10.0000 mg | ORAL_TABLET | Freq: Two times a day (BID) | ORAL | 0 refills | Status: DC | PRN
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
## 2024-02-06 NOTE — Discharge Instructions (Signed)
 You can take the muscle relaxer (Flexeril) twice daily. If the medication makes you drowsy, take only at bed time. Ibuprofen can be used every 6 hours for pain and inflammation Try topical therapies like lidocaine, icyhot, or biofreeze Avoid heavy lifting and strenuous activity It may take several days to a week for symptoms to improve. Please return or follow with orthopedics if needed

## 2024-02-06 NOTE — ED Triage Notes (Signed)
 Last Sunday had chills, back pain, fever. Has had continued back pain since then. Hurts to sit, walk, bend forward. Pain is on left side. Reports cloudy urine this morning. Has been urinating frequently. Has not had otc medications.

## 2024-04-08 ENCOUNTER — Inpatient Hospital Stay (HOSPITAL_COMMUNITY)
Admission: AD | Admit: 2024-04-08 | Discharge: 2024-04-08 | Disposition: A | Discharge status: Home / Self Care | Attending: Obstetrics & Gynecology | Admitting: Obstetrics & Gynecology

## 2024-04-08 ENCOUNTER — Inpatient Hospital Stay (HOSPITAL_COMMUNITY)

## 2024-04-08 ENCOUNTER — Encounter (HOSPITAL_COMMUNITY): Payer: Self-pay | Admitting: *Deleted

## 2024-04-08 DIAGNOSIS — O208 Other hemorrhage in early pregnancy: Secondary | ICD-10-CM | POA: Diagnosis present

## 2024-04-08 DIAGNOSIS — R519 Headache, unspecified: Secondary | ICD-10-CM

## 2024-04-08 DIAGNOSIS — O26891 Other specified pregnancy related conditions, first trimester: Secondary | ICD-10-CM | POA: Diagnosis not present

## 2024-04-08 DIAGNOSIS — Z3490 Encounter for supervision of normal pregnancy, unspecified, unspecified trimester: Secondary | ICD-10-CM

## 2024-04-08 DIAGNOSIS — O2691 Pregnancy related conditions, unspecified, first trimester: Secondary | ICD-10-CM

## 2024-04-08 DIAGNOSIS — Z3A01 Less than 8 weeks gestation of pregnancy: Secondary | ICD-10-CM

## 2024-04-08 LAB — CBC
HCT: 37 % (ref 36.0–46.0)
Hemoglobin: 12.8 g/dL (ref 12.0–15.0)
MCH: 30.3 pg (ref 26.0–34.0)
MCHC: 34.6 g/dL (ref 30.0–36.0)
MCV: 87.7 fL (ref 80.0–100.0)
Platelets: 267 10*3/uL (ref 150–400)
RBC: 4.22 MIL/uL (ref 3.87–5.11)
RDW: 12.4 % (ref 11.5–15.5)
WBC: 7.5 10*3/uL (ref 4.0–10.5)
nRBC: 0 % (ref 0.0–0.2)

## 2024-04-08 LAB — URINALYSIS, ROUTINE W REFLEX MICROSCOPIC
Bilirubin Urine: NEGATIVE
Glucose, UA: NEGATIVE mg/dL
Hgb urine dipstick: NEGATIVE
Ketones, ur: 5 mg/dL — AB
Leukocytes,Ua: NEGATIVE
Nitrite: NEGATIVE
Protein, ur: NEGATIVE mg/dL
Specific Gravity, Urine: 1.015 (ref 1.005–1.030)
pH: 6 (ref 5.0–8.0)

## 2024-04-08 LAB — HCG, QUANTITATIVE, PREGNANCY: hCG, Beta Chain, Quant, S: 47158 m[IU]/mL — ABNORMAL HIGH (ref <5)

## 2024-04-08 LAB — POCT PREGNANCY, URINE: Preg Test, Ur: POSITIVE — AB

## 2024-04-08 MED ORDER — ONDANSETRON HCL 4 MG PO TABS: 8.0000 mg | ORAL_TABLET | Freq: Two times a day (BID) | ORAL | 0 refills | Status: DC

## 2024-04-08 MED ORDER — SCOPOLAMINE 1 MG/3DAYS TD PT72: 1.0000 | MEDICATED_PATCH | TRANSDERMAL | 12 refills | Status: DC

## 2024-04-08 MED ORDER — PRENATAL COMPLETE 14-0.4 MG PO TABS: 1.0000 | ORAL_TABLET | Freq: Every day | ORAL | 3 refills | Status: AC
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
## 2024-04-08 NOTE — Discharge Instructions (Signed)
 [facility] [employer] [facility] [employer] at [employer] [facility]    Phone: [phone]  [facility] [employer] at [place]   Phone: [phone]  [facility] [employer] at [place]  Phone: [phone]  [facility] [employer] at [employer]  Phone: [phone]  [facility] [employer] at [place]  Phone: [phone]  [facility] at [facility]   Phone: [phone]  [facility]       Phone: [phone]  [facility]    Phone: [phone]   [facility]    Phone: [phone]  [facility]    Phone: [phone]  [facility]    Phone: [phone]  [facility]       Phone: [phone]   [facility]  Phone: [phone]  [facility]    Phone: [phone]  [facility] [place]   Phone: [phone]  [facility]      Phone: [phone]  [facility]    Phone: [phone]    Commonly Asked Questions During Pregnancy  How Will I Feel When I'm Pregnant? Pregnancy symptoms in the first trimester of pregnancy may not appear until the middle or end of the second month. Hormonal changes will cause tenderness in your breasts, and you may begin to feel more tired than usual. Food cravings, an increase in the need to urinate, and morning sickness may all be more noticeable.  Pregnancy symptoms in the second trimester are more prominent. You may start to feel the baby move and become more active. Dental issues, nasal/sinus problems, and skin irritations can begin to appear. Heartburn, leg cramps, dizziness, and a vaginal discharge are also common. Every woman is different when it comes to the symptoms they experience, and some may not experience any at all. Pregnancy symptoms in the  third trimester can include increased frequency in urination, leg cramps, constipation, ligament pain in the abdomen, and weight gain. Back pain and Braxton Hicks contractions will become increasingly more common.  Why is nutrition during pregnancy important? Eating well is one of the best things you can do during pregnancy. Good nutrition helps you handle the extra demands on your body as your pregnancy progresses. The goal is to balance getting enough nutrients to support the growth of your fetus and maintaining a healthy weight.  How much water should I drink during pregnancy? During pregnancy you should drink 8 to 12 cups (64 to 96 ounces) of water every day. Water has many benefits. It aids digestion and helps form the amniotic fluid around the fetus. Water also helps nutrients circulate in the body and helps waste leave the body.  What can I do to help with nausea? Eat dry toast or crackers in the morning before you get out of bed to avoid moving around on an empty stomach. Eat five or six "mini meals" a day to ensure that your stomach is never empty. Eat frequent bites of foods like nuts, fruits, or crackers.  What can help with constipation during pregnancy? Constipation is common near the end of pregnancy. Eating more foods with fiber can help fight constipation. Fiber is found in fruits, vegetables, whole grains, beans, nuts, and seeds. You should aim for about 25 grams of fiber in your diet each day. Drink a lot of water as you increase your fiber intake.  How much coffee can I drink while  I'm pregnant? Research suggests that moderate caffeine consumption (less than 200 milligrams per day) does not cause miscarriage or preterm birth. That's the amount in one 12-ounce cup of coffee. Remember that caffeine also is found in tea, chocolate, energy drinks, and soft drinks. Caffeine can interfere with sleep and contribute to nausea and light-headedness. Caffeine also can increase urination and  lead to dehydration.  What can I do to prevent or ease back pain during pregnancy? There are several things you can do to prevent or ease back pain. For example, wear supportive clothing and shoes. Pay attention to your position when sitting, sleeping, and lifting things. If you need to stand for a long time, rest one foot on a stool or a box to take the strain off your back. You also can use heat or cold to soothe sore muscles.  Is it safe to exercise during pregnancy? If you are healthy and your pregnancy is normal, it is safe to continue or start regular physical activity. Physical activity does not increase your risk of miscarriage, low birth weight, or early delivery. It's still important to discuss exercise with your ob-gyn provider during your early prenatal visits.   What are the benefits of exercise during pregnancy? Regular exercise during pregnancy benefits you and your fetus in these key ways: Reduces back pain Eases constipation May decrease your risk of gestational diabetes, preeclampsia, and cesarean birth Promotes healthy weight gain during pregnancy Improves your overall fitness and strengthens your heart and blood vessels Helps you to lose the baby weight after your baby is born  Is it safe to dye my hair during pregnancy? Yes, it's safe. Only a small amount of chemicals from hair dye is absorbed through the scalp.  Is it safe to keep a cat during pregnancy? Yes, you can keep your cat. You may have heard that cat feces can carry the infection toxoplasmosis. This infection is only found in cats who go outdoors and hunt prey, such as mice and other rodents. If you do have a cat who goes outdoors or eats prey, have someone else take over daily cleaning the litter box. This will keep you away from any cat feces. If you have an indoor cat who only eats cat food and doesn't have contact with outside animals, your risk of toxoplasmosis is very low.  What substances should I avoid  during pregnancy? During pregnancy, women should not use tobacco, alcohol, marijuana, illegal drugs, or prescription medications for nonmedical reasons. Avoiding these substances and getting regular prenatal care are important to having a healthy pregnancy and a healthy baby.   What foods to I need to avoid in pregnancy? To help prevent listeriosis, avoid eating the following foods while you are pregnant: Unpasteurized milk and foods made with unpasteurized milk, including soft cheeses Hot dogs and luncheon meats, unless they are heated until steaming hot just before serving Unwashed raw produce such as fruits and vegetables  Avoid all raw and undercooked seafood, eggs, meat, and poultry while you are pregnant. Do not eat sushi made with raw fish (cooked sushi is safe). Cooking and pasteurization are the only ways to kill Listeria.  Limit your exposure to mercury by not eating bigeye tuna, king mackerel, marlin, orange roughy, shark, swordfish, or tilefish. Limit eating white (albacore) tuna to 6 ounces a week. You do not have to avoid all fish during pregnancy. In fact, fish and shellfish are nutritious foods with vital nutrients for a pregnant woman and her fetus. Be sure  to eat at least 8-12 ounces of low-mercury fish and shellfish per week.  Is travel safe to during pregnancy? In most cases, pregnant women can travel safely until close to their due dates. But travel may not be recommended for women who have pregnancy complications. If you are planning a trip, talk with your (ob-gyn) provider. And no matter how you choose to travel, think ahead about your comfort and safety.  Can I use a sauna or hot tub early in pregnancy? It's best not to. Your core body temperature rises when you use saunas and hot tubs. This rise in temperature can be harmful for your fetus.  Can I get a massage while pregnant? Yes. Massage is a good way to relax and improve circulation. The best position for a massage  while you're pregnant is lying on your side, rather than facedown. Some massage tables have a cut-out for the belly, allowing you to lie facedown comfortably. Tell your massage therapist that you're pregnant if you're not showing yet. Many health spas offer special prenatal massages done by therapists who are trained to work on pregnant women.  Is Having Dental Work While Pregnant Safe? Pregnancy and dental work questions are common for expecting moms. Preventive dental cleanings and annual exams during pregnancy are not only safe but are recommended. The rise in hormone levels during pregnancy causes the gums to swell, bleed, and trap food causing increased irritation to your gums. Preventive dental work while pregnant is essential to avoid oral infections such as gum disease, which has been linked to preterm birth. The American Dental Association (ADA) recommends pregnant women eat a balanced diet, brush their teeth thoroughly with ADA-approved fluoride toothpaste twice a day, and floss daily. Have preventive exams and cleanings during your pregnancy. Let your dentist know you are pregnant. Postpone non-emergency dental work until the second trimester or after delivery, if possible. Elective procedures should be postponed until after the delivery.

## 2024-04-08 NOTE — MAU Note (Signed)
 MAU Triage Note  .Whitney Gonzalez is a 33 y.o. at Unknown here in MAU reporting: she is almost [redacted] weeks pregnant and with her last pregnancy she had high blood pressure. Reports for the last few weeks she has had extreme exhaustion, headaches, dizziness. Reports it is making it hard to do her job. Her b/p last night at home was 130/79. Feels this is elevated for her. Reports she has had a headache since this am. Denies vaginal bleeding. Reports occasional sharp pain in her lower abd.   Onset of complaint: few weeks   Pain score: 4/10 headache There were no vitals filed for this visit.   FHT: na  Lab orders placed from triage: ua

## 2024-05-12 DIAGNOSIS — N920 Excessive and frequent menstruation with regular cycle: Secondary | ICD-10-CM | POA: Insufficient documentation

## 2024-05-12 DIAGNOSIS — R030 Elevated blood-pressure reading, without diagnosis of hypertension: Secondary | ICD-10-CM | POA: Insufficient documentation

## 2024-05-15 ENCOUNTER — Telehealth (INDEPENDENT_AMBULATORY_CARE_PROVIDER_SITE_OTHER)

## 2024-05-15 VITALS — BP 125/82 | HR 82

## 2024-05-15 DIAGNOSIS — Z3A12 12 weeks gestation of pregnancy: Secondary | ICD-10-CM | POA: Diagnosis not present

## 2024-05-15 DIAGNOSIS — O0991 Supervision of high risk pregnancy, unspecified, first trimester: Secondary | ICD-10-CM

## 2024-05-15 DIAGNOSIS — O0990 Supervision of high risk pregnancy, unspecified, unspecified trimester: Secondary | ICD-10-CM | POA: Insufficient documentation

## 2024-05-15 MED ORDER — PROMETHAZINE HCL 25 MG PO TABS: 25.0000 mg | ORAL_TABLET | Freq: Four times a day (QID) | ORAL | 0 refills | Status: DC | PRN

## 2024-05-15 NOTE — Progress Notes (Signed)
 Pt stated she would like to discuss her birthing plan at first prenatal appt. She sated she would like: ~Delayed cord cutting ~No vaccines have to get baby tested to see if have the same trait where amino acids don't break down ~Explore birthing positions  ~Lip and tongue tie

## 2024-05-15 NOTE — Progress Notes (Signed)
 New OB Intake  I connected with Whitney Gonzalez  on 05/15/24 at  1:15 PM EDT by MyChart Video Visit and verified that I am speaking with the correct person using two identifiers. Nurse is located at [facility] and pt is located at home.  I discussed the limitations, risks, security and privacy concerns of performing an evaluation and management service by telephone and the availability of in person appointments. I also discussed with the patient that there may be a patient responsible charge related to this service. The patient expressed understanding and agreed to proceed.  I explained I am completing New OB Intake today. We discussed EDD of 11/22/2024, by Last Menstrual Period. Pt is [id number]. I reviewed her allergies, medications and Medical/Surgical/OB history.    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 Discussed the nature of our practice with multiple providers including residents and students. Due to the size of the practice, the delivering provider may not be the same as those providing prenatal care.   Patient is interested in water birth.  MyChart/Babyscripts MyChart access verified. I explained pt will have some visits in office and some  virtually. Babyscripts instructions given and order placed. Patient verifies receipt of registration text/e-mail. Account successfully created and app downloaded. If patient is a candidate for Optimized scheduling, add to sticky note.   Blood Pressure Cuff/Weight Scale Pt has blood pressure cuff. Explained after first prenatal appt pt will check weekly and document in Babyscripts.  Anatomy US  Explained first scheduled US  will be around 19 weeks. Anatomy US  scheduled for 07/04/2024 at 10:00am.  Is patient a CenteringPregnancy candidate?  Accepted    Is patient a Mom+Baby Combined Care candidate?  Not a candidate   If accepted, confirm patient does not intend to move from the area for at least 12 months, then notify Mom+Baby staff  Is patient a candidate for Babyscripts Optimization? No, due to centering   First visit review I reviewed new OB appt with patient. Explained pt will be seen by Dr. Cresenzo at first visit. Discussed [facility] [place] genetic screening with patient. [facility] and [employer].. Routine prenatal labs is needed at NOB visit.  Last Pap Diagnosis  Date Value Ref Range Status  05/31/2021   Final   - Negative for intraepithelial lesion or malignancy (NILM)    Whitney Gonzalez, CMA 05/15/2024  1:15 PM

## 2024-05-15 NOTE — Patient Instructions (Addendum)
 Safe Medications in Pregnancy   Acne:  Benzoyl Peroxide  Salicylic Acid   Backache/Headache:  Tylenol : 2 regular strength every 4 hours OR               2 Extra strength every 6 hours   Colds/Coughs/Allergies:  Benadryl  (alcohol free) 25 mg every 6 hours as needed  Breath right strips  Claritin  Cepacol throat lozenges  Chloraseptic throat spray  Cold-Eeze- up to three times per day  Cough drops, alcohol free  Flonase (by prescription only)  Guaifenesin  Mucinex  Robitussin DM (plain only, alcohol free)  Saline nasal spray/drops  Sudafed (pseudoephedrine) & Actifed * use only after [redacted] weeks gestation and if you do not have high blood pressure  Tylenol   Vicks Vaporub  Zinc lozenges  Zyrtec   Constipation:  Colace  Ducolax suppositories  Fleet enema  Glycerin  suppositories  Metamucil  Milk of magnesia  Miralax   Senokot  Smooth move tea   Diarrhea:  Kaopectate  Imodium A-D   *NO pepto Bismol   Hemorrhoids:  Anusol   Anusol  HC  Preparation H  Tucks   Indigestion:  Tums  Maalox  Mylanta  Zantac  Pepcid    Insomnia:  Benadryl  (alcohol free) 25mg  every 6 hours as needed  Tylenol  PM  Unisom, no Gelcaps   Leg Cramps:  Tums  MagGel   Nausea/Vomiting:  Bonine  Dramamine  Emetrol  Ginger extract  Sea bands  Meclizine  Nausea medication to take during pregnancy:  Unisom (doxylamine succinate 25 mg tablets) Take one tablet daily at bedtime. If symptoms are not adequately controlled, the dose can be increased to a maximum recommended dose of two tablets daily (1/2 tablet in the morning, 1/2 tablet mid-afternoon and one at bedtime).  Vitamin B6 100mg  tablets. Take one tablet twice a day (up to 200 mg per day).   Skin Rashes:  Aveeno products  Benadryl  cream or 25mg  every 6 hours as needed  Calamine Lotion  1% cortisone cream   Yeast infection:  Gyne-lotrimin 7  Monistat 7    **If taking multiple medications, please check labels to avoid  duplicating the same active ingredients  **take medication as directed on the label  ** Do not exceed 4000 mg of tylenol  in 24 hours  **Do not take medications that contain aspirin or ibuprofen           [facility] Pediatric Providers  [place]outheast [place] ([address]) [facility] Bevin Bucks, MD; Lanetta Pion, MD; [place] Lax, MD; Bronson Canny, MD; McDiarmid, MD; Floretta Huron, MD [address] [place] [place], [place] [address] [phone] Mon-Fri 8:30-12:30, 1:30-5:00  Providers come to see babies during newborn hospitalization Only accepting infants of Mother's who are seen at [facility] or have siblings seen at   [facility] Medicaid - Yes; Tricare - Yes   [facility] Andres, MD [address], [place], [place] [address] [phone] Mon, Tue, Thur, Fri 8:30-5:00, Wed 10:00-7:00 (closed 1-2pm daily for lunch) [facility] residents with no insurance.  [place] [employer] only with Medicaid/insurance; Tricare - no  [facility]) - [facility], MD; Bevin Bucks, MD; Deeann Fare, MD; Johnathan Myron, MD; Norberta Beans, MD; Charon Copper, MD; Particia Bolus, MD; Rochelle Chu,  MD; Julietta Ogren, MD; Maebelle Schmid, MD; Silvestre Drum, MD; Stuart Ellis, MD; Arvel Lather, MD; Rina Cheek, NP [address] [place], [place], [place] [address] [phone] Mon, Tue, Thur, Fri 8:30-5:30, Wed 9:30-5:30, Sat 8:30-12:30 Only accepting infants of first-time parents or siblings of current patients [facility]  discharge coordinator will make follow-up appointment Medicaid - yes; Tricare - yes  [place] ([address]) [facility] Cox, MD; Nolon Baxter, MD; Marjie Sieve, MD; Ree Candy, MD; Asencion Blacksmith, MD; [facility], MD; [place], MD; Anell Keep, MD; Broadus Canes, MD [address] [place], [place] [address] [phone] Mon-Fri 8:30-5:00, closed for lunch 12:30-1:30; Sat-Sun 10:00-1:00 Accepting Newborns with commercial insurance only, must call prior to  delivery to be accepted into  practice.  Medicaid - no, Tricare - yes   [facility] [address] [place], [place] [address] [phone] or [phone] Mon to Fri 8am to 10pm, Sat 8am to 1pm (virtual only on weekends) Only accepts Medicaid Healthy Blue pts  [facility] ([facility]) - Pediatrics at Cari Char, MD; Theodore Fisher, MD; Leonard Raker, MD; Rejeana Card, NP; Dominica Friends, MD; Skipper Dumas, MD [address] [place], [place] [address] [phone] Mon-Fri 8:30-5:30 Medicaid - yes, Tricare - yes  [place] [phone]) [facility] Billing, MD [address], [place], [place] [address] [phone] Roxana Copier, Wed Fri 8:30-5:00, Sat 8:30-12:00, Closed Thursdays Accepting siblings of established patients and first time mom's if you call prenatally Medicaid- yes; Tricare - yes  [facility] Merry [place], [place]; Barbar Bonus, MD; Genice Kettle; Scifres, PA; Paulene Boron, MD; Janifer Meigs, MD;  [address] [place], [place] [address] [phone] Mon-Fri 8:30-5:00, closed for lunch 1-2 Only accepting newborns of established patients Medicaid- no; Tricare - yes  [facility] [phone]) [facility] Mellissa Sprinkles, MD; [address], [place], [place] [address] [phone] Mon-Fri 8:00-5:00 Medicaid - No; Tricare - Yes  [facility], [place]; [place], [place] [address] [place], [place] [address] [phone] Mon-Fri 8:00-5:00 Medicaid - No, Tricare - Yes  [facility] Freddi Jaeger, MD; Aviva Boer, MD; [place], [place] [address] [place], [place] [address] [phone]  Mon-Fri 8:00-5:00 Medicaid - No; Tricare - Yes  [facility] [address] [place], [place] [address] [phone] Mon-Fri 8:30-5:00 (lunch 12:00-1:00) Medicaid -Yes; Tricare - Yes  [facility] [place], MD [address] [place],  [place], [place] [address] [phone] Mon-Fri 8:00-5:00 Seeing newborns of current patients only. No new patients Medicaid - No, Tricare - yes  Nature conservation officer at [place] [address], MD [address] [place], [place] [address] [phone] Mon-Fri 8:00-5:00 Medicaid -yes as secondary coverage only; Tricare - yes  [facility] [place], [place]; [place], [place]; Arsenio Bigger, MD; Melanee Spire, MD; Doc Freed, MD; [place], [place]; Smoot, NP; Kimber Pencil, MD; [place], MD [address] [place], [place] [address] [phone] Mon-Fri 8:30-5:00, Sat 9:00-11:00 Accepts commercial insurance ONLY. Offers free prenatal information sessions for families. Medicaid - No, Tricare - Call first  [facility] [place], MD; [place], [place]; [place], [place]; [place], [place] [address] [place] [address] [phone] Mon-Fri 7:30-5:30 Medicaid - Yes; Duaine German yes  [place] [phone] & [phone])  [facility], MD [address] [place], [place] [address] [phone] Mon-Thur 8:00-6:00, closed for lunch 12-2, closed Fridays Medicaid - yes; Tricare - no  [facility] Alva Jewels, NP; Charmel Cooter, MD; [place], [place]; [place], [place] [address], [place], [place] [address] [phone] Mon-Fri 7:30-4:30 Medicaid - yes, Tricare - yes  [facility]  Jules Oar, MD; Arleta Bench, NP; Rhea Cecil, MD; Clarke Crouch, NP [address], [place], [place] [address] [phone] Mon-Fri 8:30-5:00, closed for lunch 1-2, Sat 8:30-12:00 - sick visits only Providers come to see babies at  [facility] Only accepting newborns of siblings and first time parents ONLY if who have met with office prior to delivery Medicaid -Yes; Tricare - yes  [facility] - [place], [place]; Tamar Fairly, NP; Lajuana Pilar, MD; Rachel Budds, MD:  [address], [place], [place] [address] [phone] Mon- Fri 8:00-5:00, Sat 9:00-12:00 - sick  visits only Accepting siblings of established patients and first time mom/baby Medicaid - Yes; Tricare - yes Patients must have vaccinations (baby vaccines)  [place] [phone] & [phone])  Adult nurse HealthCare at [facility] [address] [place], [place] [address] [phone] Mon-Fri 8:00-5:00 Medicaid - no; Tricare - yes  [facility] [place], MD; [place], [place]; [place], [place] [address], [place], [place] [address] [phone] Mon-Fri 8:00-5:00 Medicaid- yes; Tricare - yes  [facility] - Sharmon Dec, MD; Rochelle Chu, NP; [place], [place] [address] [place], [place], [place] [address] [phone] Mon-Fri 8:00-5:00 Medicaid - Yes; Tricare - yes  [address] [place] [phone])  [facility] [place], [place]; [place], [place]; Encarnacion Harris, MD; Mills Alma, MD; [place], NP; [place], DO; [place], [place]; Arabella Beach, MD; Kenyon Pears, MD; Valda Garnet, MD; [place], [place]; [place], [place]; [place], [place] [address] [facility] [address], [place], [place] [address] [phone] Mon-Fri 8:30-5:00, Sat 9:00-12:00 - sick only Please register online [email or web address] then schedule online or call office Medicaid-Yes; Tricare -yes  [facility] - Premier  [place], MD; Ellwood Haber, MD; Brevig Mission, MD; [place], NP; [place], [place]; Blain Bulls, MD; Micael Adas, NP; Stephenie Einstein, MD [address], [place], [place] [address] [phone] Mon-Fri 8:00-5:30, Sat&Sun by appointment (phones open at 8:30) Medicaid - Yes; Tricare - yes  [place] [phone] & [phone]) [facility] Randalyn Bushman; [place], MD; Carolin Chyle, MD; Monta Anton, NP; [place], DO [address], [place], [place] [address] [phone] M-F 8:00 - 5:15, Sat/Sun 9-12 sick visits only Medicaid - No; Tricare - yes  [facility]  [place], PA-C; [place], PA-C; [place], DO; [place], PA-C; [place], PA-C; Lila Regal, MD [address], [place], [place] [address] [phone] Mon-Thur 8:00-7:00, Fri 8:00-5:00 Accepting Medicaid for 13 and under only   [facility] at [place] [facility] [place]) [place], [place]; List, FNP; Gertha Ku, MD; Gilmer Lady, PA-C; Governor Laundry, MD; Lyda Samples, FNP; [place], FNP; Gaspar Karma, MD; Gertha Ku, MD [phone] [address], [place], [place] [address] [phone] Mon-Fri 8:30-5:30 Medicaid - Yes; Tricare - yes  [facility]s - [address]  [place], [place]; Johnnette Nakayama, MD; Jerryl Morin, MD; Nancyann Aye, MD; [place], NP [address], [place], [place] [address] [phone] Mon-Thur 8:00-5:30, Fri 8:00-5:00, Sat 9:00-12:00 Medicaid - yes, Tricare - yes  [place] [phone])  [facility], [place]; Enrique Harvest, MD; [place], [place] [address], [place], [place] [address] [phone] Mon-Fri 8:00-5:00, closed for lunch 12-1 Medicaid - No; Tricare - yes  Nature conservation officer at [facility], MD [address] [place], [place] [address] [phone] Mon-Fri 8:00-5:00 Medicaid - No; Tricare - yes  [facility], MD; Lulu Sales, MD; Delmas Ferrari, MD; Rochelle Chu, MD [address], [place], [place] [address] [phone] Mon-Fri 8:00-5:00 Medicaid- Yes; Tricare - yes  [place] [phone])  Adult nurse HealthCare at [facility], [place]; [place], MD [address], [place], [place] [address] [phone] Mon-Fri  8:00-5:00 Medicaid - No; Tricare - yes  [facility] - [facility] - CPNP [address] [place], [place], [place] [address] [phone] Mon-Weds 8:00-6:00, Thurs-Fri 8:00-5:00, Sat 9:00-12:00 Medicaid - yes; Tricare - yes   [facility] Lyndal Sandy, MD; [employer], [place] [address] [place], [place] [address] [phone] Mon-Fri 8:00-5:00 Medicaid - yes; Tricare - yes  [facility]  Pediatric Providers  [facility] [address] [place], [place] [address] [phone] Emmette Harms: 8am -8pm, Tues, Weds: 8am - 5pm; Fri: 8-1 Medicaid - Yes; Tricare - yes  [facility] Kandis Ormond, MD; [place] Renshaw, MD; Karlynn Oyster, MD; [place], [place]; [place], [place] [address], [place], [place] [address] [phone] M-F 8:30 - 5:00 Medicaid - Call office; Tricare -yes  [facility] Abbott Hockey, MD; Glyn Laser, MD, Viann Graces, MD; Benedetto Brady, PNP; Abner Ables, NP [phone] [address] [place], [place] [address] [phone] M-F 8:30 - 5:00, Sat/Sun 8:30 - 12:30 (sick visits) Medicaid - Call office; Tricare -yes  [facility] Harles Lied, MD; Sylvester Evert, PNP; Tory Freiberg, MD; [place], [place]; [place], NP; Kareen Osier [address], [place], [place] [address] [phone] M-F 8:30 - 5:00 Medicaid - Call office; Tricare - yes  [facility] Orion Birks, MD; Gonzella Latin, MD; Dani Dupont, MD; Luci Russell, MD; [place], MD [phone] [address], [place], [place] [address] [phone] M-Thur: 8:00 - 5:00; Fri: 8:00 - 4:00 Medicaid - yes; Tricare - yes  [facility] [address] [place], [place] [address] [phone] M-F: 8:30- 5:00, closed for lunch 12:30 - 1:00 Medicaid - yes; Tricare -yes  [facility] [address] [place], [place] [address] [phone] M-F 8:00 - 5:00 Medicaid - yes; Tricare - yes  [place] [facility] [place], DO; Smarr, DO; [place], NP [address], [place], [place] [address] [phone] M-F 8:00 - 5:00, Closed 12-1 for lunch Medicaid - Call; Tricare - yes  [facility] Vallie Gay, MD [address], [place], [place] [address] [phone] M-F: 8:00-5:00, Sat: 8:00 - noon Medicaid - call; Tricare -yes  [facility] Pediatric Providers  [facility] [place], [place] [address] [place], [place], [place] [address] [phone] M-W: 8:00-5:00, Thur: 8:00 -  7:00, Fri: 8:00 - noon Medicaid - yes; Tricare - yes  [facility] - Debarah Faes, FNP [address], [place], [place] [address] [phone] M-F 8:00 - 5:00, Closed for lunch 12-1 Medicaid - yes; Tricare - yes  [facility] Pediatric Providers  [facility] at [place], [place], Rufina Cough, MD, [place], FNP-C [address], [facility], [address], [place], [place] [address] [phone] M-T 8:00-5:00, Wed-Fri 7:00-6:00 Medicaid - Yes; Tricare -yes  [facility] at [facility], DO; [address] [place], [place] [address] [phone] M-F 8:00 - 5:00, closed for lunch 12-1 Medicaid - Yes; Tricare - yes  [facility] and Internal Medicine  Langston Pippins, MD; Serapio Dan, MD; Jerelene Monday, MD; Gardiner Jumper, MD; Emmette Harms, MD; Dotty Gee, MD; Claire Crick, MD, Cathlene Coad, MD; Ena Harries, MD; Brian Campanile, MD; Amy Kansky, MD; Rachel Budds, MD [address], [place], [place] [address] [phone] M-F 8:00-5:00 Medicaid - yes; Tricare - yes  [facility] [place], MD (speaks [place] and Hindi) [address] [place], [place] [address] [phone] M-F: 8:30 - 5:00, closed 12:30 - 1 for lunch Medicaid - Yes; Tricare -yes  [facility] Pediatric Providers  [facility] Lamonte Pimenta, MD; Donia Furlough, MD; Tommas Fragmin, MD [phone]  [address] [place], [place] [address] [phone] M-Th: 8:00 - 5:30, Fri: 8:00 - 12:00 Medicaid - yes; Tricare - yes  [facility] at [facility], NP; Jenny Mohs, MD; Donetta Furl, MD [phone] [address], [place], [place] [address] [phone] M-F: 8:00 - 5:00 Medicaid - yes; Tricare - yes  [facility] [place], NP; Duwaine Gins, NP; Grant Lay, NP; Teresita Fent, MD; Broadus Canes, MD, [place], NP, Monty App, MD; Curlene Dotter [address] [place], [place] [address] [phone] M-F: 8:30 - 5:30p Medicaid - yes; Tricare - yes Other locations available as well  [facility], MD; Elvan Hamel, MD; Mardel Shad, PA-C [address] [place], [place] [address] [phone] M-W: 8:00am - 7:00pm, Thurs: 8:00am - 8:00pm; Fri: 8:00am - 5:00pm, closed daily from 12-1 for lunch Medicaid - yes; Tricare - yes  [facility] Pediatric Providers  [facility] at Judie Noun, MD; Maida Sciara, FNP; Rise Cheng, MD; Audrie Leatherwood, MD; [place], PNP; Catana Clinch; [place], [place]; Burley Carpenter, MD;  [address], [place], [place] [address] [phone] Melven Stable - Timm Foot: 8am - 5pm, Sat 9-noon Medicaid - Yes; Tricare -yes  [facility] at Elonda Hale, MD; Rochelle Chu, FNP; Michalene Agee, MD; Delmas Ferrari, MD [address] [place], [place] [phone] M-F 8:00 - 5:00 Medicaid - call; Tricare - yes  [facility]- Lazarus Primer, MD; [place], [place]; Olevia Bers, MD; Alva Jewels, MD; Carolene Chute, MD; Real Camp, MD; Darien Eden; Leslee Rase, MD; Arnoldo Lapping, MD; [place], MD [address], [place], [place] [address] [phone] M-F 8:00am - 5:00pm; Sat. 9:00 - 11:00 Medicaid - yes; Tricare - yes  [facility] at [facility], MD [address] [place], [place] [address] [phone] M-F 8:00 - 5:00 Medicaid - [place] Medicaid only; Tricare - yes  [facility] - Londa Rival, MD; Nolon Baxter, [place]; Selwyn Dalton, MD [address], [place], [place] [address] [phone] M-F 8:00 - 5:00 Medicaid - yes; Tricare - yes  [facility] [address] Pediatrics - Sanna Crystal, MD; Bevin Bucks, MD, [facility], MD, [place], MD; [place], MD; Felipe Horton, MD; [address] [place], [place] [address] [phone] M-F: 8-5 Medicaid - yes; Tricare - yes  [facility] - Lei Pump, [place]; [place], MD; [address] [place], [place] [address] [phone] M-F 8-5 Medicaid - yes; Tricare - yes  [address] [place], MD; Lulu Sales, MD; [place], MD; [place], DNP;  [place], PNP [address], [place], [place] [address] [phone] M-F 8-5 Medicaid - yes; Tricare - yes  [facility] Internal [facility] and Pediatrics Belia Bowl, MD; Elaine Graves; Charlotte Cookey, MD [address] [place], [place] [address] [phone] M-F 7am - 5 pm Medicaid - call; Tricare - yes  [facility] [place], [place]; Blanchard Bunk, MD; Verdia Glad, MD [address] [place], [place] [address] [phone] M-F 8-5 Medicaid - yes; Tricare - yes  [facility] Lindalou Reus, MD; Myrlene Asper, MD; Broadus Canes, FNP; Vaughn Georges, FNP; Margherita Shell, FNP; Carilyn Charles; [facility] - FNP [address] [place], [place] [address] [phone] M-F 8-5 Medicaid- yes; Tricare - yes  [facility] - Larae Plaster, [place]ively and Mariel Shope, MD; Remigio Carl, MD; Judee Norway, MD; Annette Barters, MD; [place], [place]; Dyann Glaser, MD; Blanchard Bunk, MD; Rodell Citrin, MD [address], [place], [place] [address] [phone] M-F: 8-5, Sat: 9-4, Sun 9-12 Medicaid - yes; Tricare - yes  [facility] [place], PNP; Nolon Baxter, PNP [address] [place], [place] [address]  [phone] M-F 8 - 5, closed 12-1 for lunch Medicaid - yes; Tricare - yes  [facility] Sherrie Doing, MD; Tamera Falco, MD; Rodell Citrin, MD; [place], DO [address] [place], [place] [address] [phone] M-F 8- 5:30 Medicaid - yes; Tricare - yes  [facility] - Antonieta Kitten, MD; Synetta Eves, MD; Maury Space, MD [address] [place], [place] [address] [phone] Melven Stable: Andreas Bandy; Tues-Fri: 8-5; Sat: 9-12 Medicaid - yes; Tricare - yes  [facility] - Erby Hatcher, MD; Serena Dana, MD; Fulton Job, MD; Bernhard Brine, MD; Herminia Lope, MD [address], [place], [place] [address] [phone] Melven StableAaron Aas Andreas BandyRosetta Cons: 8-5; Sat: 8:30-12:30 Medicaid - yes;  Tricare - yes  [facility]s - Thurlow Florence, MD; [place], [place] [address] [place], [place] [address] [phone] Mon-Fri: 8-5 Medicaid - yes; Tricare - yes  [facility] Pediatrics - [place], CPNP; [place], [place]; Rodell Citrin, MD; Claudio Culver, MD; Adelfa Adolph, MD; [address] [place], [place] [address] [phone] M-F: 8-5, closed 1-2 for lunch Medicaid - yes; Tricare - yes  [facility] - [facility] [place], [place]; [place], [place]; Felipe Horton, MD; [place], CPNP; [place], [place]; [place], MD; Lajuana Pilar, MD [address], [place], [place] [address] [phone] M-Thurs: Andreas Bandy; Fri: 8-6; Sat: 9-12; Sun 2-4 Medicaid - yes; Tricare - yes  [facility] Amye Kaplan, MD; Stevenson Elbe, MD; Rogelia Clarks, FNP; Lorane Rocker, DO; [address] [place], [place] [address] [phone] M-F: 8-5 Medicaid - yes; Tricare - yes  [facility] Pediatric Providers  [facility] Medicine -Anselmo Bast, MD; [place], NP [address] [place], [place] [address] [phone] M - Fri: 8am - 5pm, closed for lunch 12-1 Medicaid - Yes; Tricare - yes  [facility] and Pediatrics Gillian Lacrosse, MD; Eusebio High, MD; [place], DO; Vinocur, MD;Hall, PA; Sueanne Emerald, [place]; Daina Drum, NP [phone] [address], [place], [place] [address] [phone] M-F 8:00 - 5:00, Sat 8:00 - 11:30 Medicaid - yes; Tricare - yes  [facility] Meredeth Stallion, MD; [place], MD, [address], MD, Watkins, MD, [place], MD; [place], NP; [place], [place];  [address] [place], [place] [address] [phone] M-F 8:10am - 5:00pm Medicaid - yes; Tricare - yes  [facility] Britta Candy, MD; [place], NP [address] [place], [place] [address] [phone] M-F 8:00 - 5:00 Medicaid - [place] Medicaid only; Tricare - yes  [facility] - [facility] [address] Drake, MD; [place], NP [address], [place], [place] [address] [phone] M-F 8:00 - 5:00; Closed for lunch 12 - 1:00 Medicaid - yes; Tricare - yes  [facility] Winfred Hausen, MD; Gerrit Krill, FNP [address], [place], [place] [address] [phone] Mon 9-5; Tues/Wed 10-5; Thurs 8:30-5; Fri: 8-12:30 Medicaid - yes; Tricare - yes  [facility] Prov[facility]  [facility]  [place], MD; [place], [place] [address] [place], [place] [address] [phone] phone [phone] fax M-F 7:15 - 4:30 Medicaid - yes; Tricare - yes  [place] [facility] Ena Harries, MD; [place], DO [address] [place], [place] [address] [phone] M-Fri: 8:30 - 5:00, closed for lunch everyday noon - 1pm Medicaid - Yes; Tricare - yes  [facility] Burdine, MD; Bearl Limes, MD; Gwen Lek, MD; Marykay Snipes, MD; [place], [place]; Clarisse Crosby, [place]; [place], [place]; [place], [place]; [place],  PA [address] [place], [place] [address] [phone] M-Thurs: 7:30am - 7:00pm; Friday 7:30am - 4pm; Sat: 8:00 - 1:00 Medicaid - Yes; Tricare - yes  [place] [facility] Randalyn Bushman, MD; Arnett Lanius, MD; Trinna Furbish, MD; [place], DO [address], [place], [place] [address] [phone] M-Thur: 8:00 - 5:00, Fri: 8:00 - Noon Medicaid - yes; Tricare - yes No [place] [employer]  [place] [facility] Det[place], MD; Bonnell Butcher, DO; [place], NP; Gaylyn Keas, NP; Britta Candy, NP; Rosalynn Come, NP; Carlyle Childes, NP; Veleta Gerold, MD; [place], [place] [address] [place], [place] [address] [phone] M-F 8:00 - 5:00 Medicaid - yes; Tricare - yes  [facility], FNP-C; [place], FNP-C [address] [place], [place] [address] [phone] M, W, R 8:00-5:00, Tues: 8:00am - 7:00pm; Fri 8:00 - noon Medicaid - Yes; Tricare - yes  [facility], MD [address] [place], [place] [address] [phone]  M-Thurs 8:30-5:30, Fri: 8:30-12:30pm Medicaid - Yes; Tricare - N    Considering Waterbirth? Guide for patients at [facility]) Why consider waterbirth? Gentle birth for babies  Less pain medicine used in labor  May allow for passive descent/less pushing  May reduce perineal tears  More mobility and instinctive maternal position changes  Increased maternal relaxation   Is waterbirth safe? What are the risks of infection, drowning or other complications? Infection:  Very low risk (3.7 % for tub vs 4.8% for bed)  7 in 8000 waterbirths with documented infection  Poorly cleaned equipment most common cause  Slightly lower group B strep transmission rate  Drowning  Maternal:  Very low risk  Related to seizures or fainting  Newborn:  Very low risk. No evidence of increased risk of respiratory problems in multiple large studies  Physiological protection from breathing under water  Avoid underwater birth if there are any fetal complications  Once baby's head is out of the water, keep it out.  Birth complication  Some reports of cord trauma, but risk decreased by bringing baby to surface gradually  No evidence of increased risk of shoulder dystocia. Mothers can usually change positions faster in water than in a bed, possibly aiding the maneuvers to free the shoulder.   There are 2 things you MUST do to have a waterbirth with [facility]: Attend a waterbirth class at [facility] at [facility]   3rd Wednesday of every month from 7-9 pm (virtual during COVID) [employer] at [email or web address] or [email or web address] or by calling [phone] Bring us  the certificate from the class to your prenatal appointment or send via MyChart Meet with a midwife at 36 weeks* to see if you can still plan a waterbirth and to sign the consent.   *We also recommend that you schedule as many of your prenatal  visits with a midwife as possible.    Helpful information: You may want to bring a bathing suit top to the hospital to wear during labor but this is optional.  All other supplies are provided by the hospital. Please arrive at the hospital with signs of active labor, and do not wait at home until late in labor. It takes 45 min- 1 hour for fetal monitoring, and check in to your room to take place, plus transport and filling of the waterbirth tub.    Things that would prevent you from having a waterbirth: Premature, <37wks  Previous cesarean  birth  Presence of thick meconium-stained fluid  Multiple gestation (Twins, triplets, etc.)  Uncontrolled diabetes or gestational diabetes requiring medication  Hypertension diagnosed in pregnancy or preexisting hypertension (gestational hypertension, preeclampsia, or chronic hypertension) Fetal growth restriction (your baby measures less than 10th percentile on ultrasound) Heavy vaginal bleeding  Non-reassuring fetal heart rate  Active infection (MRSA, etc.). Group B Strep is NOT a contraindication for waterbirth.  If your labor has to be induced and induction method requires continuous monitoring of the baby's heart rate  Other risks/issues identified by your obstetrical provider   Please remember that birth is unpredictable. Under certain unforeseeable circumstances your provider may advise against giving birth in the tub. These decisions will be made on a case-by-case basis and with the safety of you and your baby as our highest priority.      CenteringPregnancy is a model of prenatal care that started 30 years ago and is used in about 600 practices around the [place] . You meet with a group of 8-12 women due around the same time as you. In Centering you will have individual time with the provider and meet as a group. There's much more time for discussion and learning. You will actually have much more time with your provider in Centering than in traditional  prenatal care.? You will come directly into the Centering room and will not wait in the lobby so there is no wasted time. You will have 2-hour visits every 4 weeks then every 2 weeks. You will know your Centering prenatal appointments in advance. In your last month of pregnancy, you may also come in for some individual visits. Additional appointments can be scheduled if you need more care. Studies have shown that CenteringPregnancy improves birth outcomes. We have seen especially big improvements in fewer Black women delivering babies who are too small or born too early. Visit the website CenteringHealthcare for more information. Let your provider or clinic staff know if you want to sign up or email [email or web address] for more information.   CenteringPregnancy Video      Foods and Drinks to Avoid or Limit: Alcohol: No amount of alcohol is safe during pregnancy.  Unpasteurized milk, cheese, and juice: These can contain harmful bacteria.  Raw or undercooked meat, poultry, and fish: These can carry bacteria that can harm the baby.  High-mercury fish: Limit or avoid high-mercury fish like swordfish, king mackerel, and shark.  Deli meats and prepared salads: These may contain bacteria, so it's best to heat them until steaming hot before eating.  Raw sprouts: These can harbor  bacteria and should be avoided.  Excessive caffeine: Limit caffeine intake to no more than 200 milligrams per day.

## 2024-05-19 ENCOUNTER — Ambulatory Visit: Payer: Self-pay | Admitting: Family Medicine

## 2024-05-19 ENCOUNTER — Other Ambulatory Visit: Payer: Self-pay

## 2024-05-19 VITALS — BP 120/84 | HR 94 | Wt 131.3 lb

## 2024-05-19 DIAGNOSIS — Z3A13 13 weeks gestation of pregnancy: Secondary | ICD-10-CM | POA: Diagnosis not present

## 2024-05-19 DIAGNOSIS — O0990 Supervision of high risk pregnancy, unspecified, unspecified trimester: Secondary | ICD-10-CM

## 2024-05-19 DIAGNOSIS — Z1332 Encounter for screening for maternal depression: Secondary | ICD-10-CM | POA: Diagnosis not present

## 2024-05-19 DIAGNOSIS — O0991 Supervision of high risk pregnancy, unspecified, first trimester: Secondary | ICD-10-CM

## 2024-05-19 DIAGNOSIS — Z3481 Encounter for supervision of other normal pregnancy, first trimester: Secondary | ICD-10-CM

## 2024-05-19 DIAGNOSIS — F419 Anxiety disorder, unspecified: Secondary | ICD-10-CM

## 2024-05-19 DIAGNOSIS — F32A Depression, unspecified: Secondary | ICD-10-CM

## 2024-05-19 DIAGNOSIS — Z3401 Encounter for supervision of normal first pregnancy, first trimester: Secondary | ICD-10-CM

## 2024-05-19 DIAGNOSIS — Z8759 Personal history of other complications of pregnancy, childbirth and the puerperium: Secondary | ICD-10-CM

## 2024-05-19 DIAGNOSIS — J45909 Unspecified asthma, uncomplicated: Secondary | ICD-10-CM

## 2024-05-19 DIAGNOSIS — E039 Hypothyroidism, unspecified: Secondary | ICD-10-CM
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
## 2024-05-20 LAB — HEMOGLOBIN A1C
Est. average glucose Bld gHb Est-mCnc: 100 mg/dL
Hgb A1c MFr Bld: 5.1 % (ref 4.8–5.6)

## 2024-05-20 LAB — CBC/D/PLT+RPR+RH+ABO+RUBIGG...
Antibody Screen: NEGATIVE
Basophils Absolute: 0 10*3/uL (ref 0.0–0.2)
Basos: 0 %
EOS (ABSOLUTE): 0.1 10*3/uL (ref 0.0–0.4)
Eos: 1 %
HCV Ab: NONREACTIVE
HIV Screen 4th Generation wRfx: NONREACTIVE
Hematocrit: 39.1 % (ref 34.0–46.6)
Hemoglobin: 13 g/dL (ref 11.1–15.9)
Hepatitis B Surface Ag: NEGATIVE
Immature Grans (Abs): 0 10*3/uL (ref 0.0–0.1)
Immature Granulocytes: 0 %
Lymphocytes Absolute: 1.9 10*3/uL (ref 0.7–3.1)
Lymphs: 22 %
MCH: 31.3 pg (ref 26.6–33.0)
MCHC: 33.2 g/dL (ref 31.5–35.7)
MCV: 94 fL (ref 79–97)
Monocytes Absolute: 0.6 10*3/uL (ref 0.1–0.9)
Monocytes: 7 %
Neutrophils Absolute: 6 10*3/uL (ref 1.4–7.0)
Neutrophils: 69 %
Platelets: 272 10*3/uL (ref 150–450)
RBC: 4.15 x10E6/uL (ref 3.77–5.28)
RDW: 13.1 % (ref 11.7–15.4)
RPR Ser Ql: NONREACTIVE
Rh Factor: POSITIVE
Rubella Antibodies, IGG: 2.64 {index} (ref >0.99)
WBC: 8.6 10*3/uL (ref 3.4–10.8)

## 2024-05-20 LAB — TSH+FREE T4
Free T4: 0.98 ng/dL (ref 0.82–1.77)
TSH: 1.11 u[IU]/mL (ref 0.450–4.500)

## 2024-05-20 LAB — HCV INTERPRETATION

## 2024-05-20 MED ORDER — ASPIRIN 81 MG PO TBEC: 162.0000 mg | DELAYED_RELEASE_TABLET | Freq: Every day | ORAL | 3 refills | Status: DC

## 2024-05-20 NOTE — Addendum Note (Signed)
 Addended by: Ferdie Housekeeper on: 05/20/2024 10:24 AM   Modules accepted: Orders

## 2024-05-21 LAB — CULTURE, OB URINE

## 2024-05-21 LAB — URINE CULTURE, OB REFLEX

## 2024-05-26 LAB — PANORAMA PRENATAL TEST FULL PANEL:PANORAMA TEST PLUS 5 ADDITIONAL MICRODELETIONS: FETAL FRACTION: 10

## 2024-05-27 LAB — HORIZON CUSTOM: REPORT SUMMARY: NEGATIVE

## 2024-06-17 ENCOUNTER — Ambulatory Visit (INDEPENDENT_AMBULATORY_CARE_PROVIDER_SITE_OTHER): Admitting: Obstetrics and Gynecology

## 2024-06-17 ENCOUNTER — Other Ambulatory Visit: Payer: Self-pay

## 2024-06-17 ENCOUNTER — Encounter: Payer: Self-pay | Admitting: Obstetrics and Gynecology

## 2024-06-17 VITALS — BP 121/83 | HR 108 | Wt 131.9 lb

## 2024-06-17 DIAGNOSIS — F32A Depression, unspecified: Secondary | ICD-10-CM

## 2024-06-17 DIAGNOSIS — J45909 Unspecified asthma, uncomplicated: Secondary | ICD-10-CM

## 2024-06-17 DIAGNOSIS — O99342 Other mental disorders complicating pregnancy, second trimester: Secondary | ICD-10-CM

## 2024-06-17 DIAGNOSIS — O0990 Supervision of high risk pregnancy, unspecified, unspecified trimester: Secondary | ICD-10-CM

## 2024-06-17 DIAGNOSIS — O99512 Diseases of the respiratory system complicating pregnancy, second trimester: Secondary | ICD-10-CM | POA: Diagnosis not present

## 2024-06-17 DIAGNOSIS — Z3A17 17 weeks gestation of pregnancy: Secondary | ICD-10-CM

## 2024-06-17 DIAGNOSIS — Z8759 Personal history of other complications of pregnancy, childbirth and the puerperium: Secondary | ICD-10-CM

## 2024-06-17 DIAGNOSIS — F419 Anxiety disorder, unspecified: Secondary | ICD-10-CM

## 2024-06-17 NOTE — Progress Notes (Signed)
   PRENATAL VISIT NOTE  Subjective:  Whitney Gonzalez is a 33 y.o. [phone] at [redacted]w[redacted]d being seen today for ongoing prenatal care.  She is currently monitored for the following issues for this low-risk pregnancy and has Bipolar I disorder (HCC); Hypothyroidism; Arthralgia; Chronic fatigue; De Quervain's tenosynovitis, left; History of recurrent miscarriages, not currently pregnant; Irregular bleeding; Anxiety; Mass of left side of neck; Positive pregnancy test; Supervision of high risk pregnancy, antepartum; Anxiety and depression; Uncomplicated asthma; and History of gestational hypertension on their problem list.  Patient reports increasing drainage and subsequent cough in the last week Doesn't think she is sick, has not had any other symptoms, and reports her asthma presents as coughing. Took guafenicine. Contractions: Irritability. Vag. Bleeding: None.  Movement: Present. Denies leaking of fluid.   The following portions of the patient's history were reviewed and updated as appropriate: allergies, current medications, past family history, past medical history, past social history, past surgical history and problem list.   Objective:    Vitals:   06/17/24 1530  BP: 121/83  Pulse: (!) 108  Weight: 131 lb 14.4 oz (59.8 kg)    Fetal Status:  Fetal Heart Rate (bpm): 151   Movement: Present    General: Alert, oriented and cooperative. Patient is in no acute distress.  Skin: Skin is warm and dry. No rash noted.   Cardiovascular: Normal heart rate noted  Respiratory: Normal respiratory effort, no problems with respiration noted  Abdomen: Soft, gravid, appropriate for gestational age.  Pain/Pressure: Present     Pelvic: Cervical exam deferred        Extremities: Normal range of motion.  Edema: Trace  Mental Status: Normal mood and affect. Normal behavior. Normal judgment and thought content.   Assessment and Plan:  Pregnancy: [id number] at [redacted]w[redacted]d  1. Supervision of high risk pregnancy,  antepartum (Primary) AFP  2. History of gestational hypertension Cont baby aspirin   3. Anxiety and depression No meds  4. Uncomplicated asthma, unspecified asthma severity, unspecified whether persistent Some drainage that has improved with benadryl , conts to use inhaler prn If no improvement, will consider adding daily meds Has been on singulair  as a kid with improvement but grew out of it   Preterm labor symptoms and general obstetric precautions including but not limited to vaginal bleeding, contractions, leaking of fluid and fetal movement were reviewed in detail with the patient. Please refer to After Visit Summary for other counseling recommendations.   Return in about 3 weeks (around 07/08/2024) for low OB.  Future Appointments  Date Time Provider Department Center  07/04/2024 10:00 AM [facility] PROVIDER 1 [facility]  07/04/2024 10:30 AM [facility] US5 [facility]  07/15/2024  4:15 PM Nicholaus Burnard HERO, MD [facility]  08/05/2024  3:55 PM Eveline Lynwood MATSU, MD [facility]  09/02/2024 10:55 AM Nicholaus Burnard HERO, MD [facility]    Burnard HERO Nicholaus, MD

## 2024-06-17 NOTE — Addendum Note (Signed)
 Addended by: LENNIE RONCO T on: 06/17/2024 04:09 PM   Modules accepted: Orders

## 2024-06-19 ENCOUNTER — Inpatient Hospital Stay (HOSPITAL_COMMUNITY)
Admission: AD | Admit: 2024-06-19 | Discharge: 2024-06-19 | Disposition: A | Discharge status: Home / Self Care | Attending: Obstetrics and Gynecology | Admitting: Obstetrics and Gynecology

## 2024-06-19 ENCOUNTER — Encounter (HOSPITAL_COMMUNITY): Payer: Self-pay | Admitting: Obstetrics and Gynecology

## 2024-06-19 ENCOUNTER — Ambulatory Visit: Payer: Self-pay | Admitting: Family Medicine

## 2024-06-19 ENCOUNTER — Encounter: Payer: Self-pay | Admitting: Family Medicine

## 2024-06-19 ENCOUNTER — Inpatient Hospital Stay (HOSPITAL_COMMUNITY)

## 2024-06-19 DIAGNOSIS — R053 Chronic cough: Secondary | ICD-10-CM

## 2024-06-19 DIAGNOSIS — R059 Cough, unspecified: Secondary | ICD-10-CM | POA: Diagnosis present

## 2024-06-19 DIAGNOSIS — J45909 Unspecified asthma, uncomplicated: Secondary | ICD-10-CM | POA: Diagnosis not present

## 2024-06-19 DIAGNOSIS — Z3A17 17 weeks gestation of pregnancy: Secondary | ICD-10-CM | POA: Diagnosis not present

## 2024-06-19 DIAGNOSIS — O99512 Diseases of the respiratory system complicating pregnancy, second trimester: Secondary | ICD-10-CM | POA: Diagnosis not present

## 2024-06-19 DIAGNOSIS — R0789 Other chest pain: Secondary | ICD-10-CM | POA: Diagnosis present

## 2024-06-19 DIAGNOSIS — J189 Pneumonia, unspecified organism: Secondary | ICD-10-CM

## 2024-06-19 LAB — COMPREHENSIVE METABOLIC PANEL WITH GFR
ALT: 14 U/L (ref 0–44)
AST: 16 U/L (ref 15–41)
Albumin: 2.8 g/dL — ABNORMAL LOW (ref 3.5–5.0)
Alkaline Phosphatase: 61 U/L (ref 38–126)
Anion gap: 9 (ref 5–15)
BUN: <5 mg/dL — ABNORMAL LOW (ref 6–20)
CO2: 22 mmol/L (ref 22–32)
Calcium: 9 mg/dL (ref 8.9–10.3)
Chloride: 106 mmol/L (ref 98–111)
Creatinine, Ser: 0.44 mg/dL (ref 0.44–1.00)
GFR, Estimated: >60 mL/min (ref >60)
Glucose, Bld: 72 mg/dL (ref 70–99)
Potassium: 4.3 mmol/L (ref 3.5–5.1)
Sodium: 137 mmol/L (ref 135–145)
Total Bilirubin: 0.4 mg/dL (ref 0.0–1.2)
Total Protein: 6.6 g/dL (ref 6.5–8.1)

## 2024-06-19 LAB — CBC
HCT: 33.7 % — ABNORMAL LOW (ref 36.0–46.0)
Hemoglobin: 11.9 g/dL — ABNORMAL LOW (ref 12.0–15.0)
MCH: 30.8 pg (ref 26.0–34.0)
MCHC: 35.3 g/dL (ref 30.0–36.0)
MCV: 87.3 fL (ref 80.0–100.0)
Platelets: 253 K/uL (ref 150–400)
RBC: 3.86 MIL/uL — ABNORMAL LOW (ref 3.87–5.11)
RDW: 12.5 % (ref 11.5–15.5)
WBC: 6.9 K/uL (ref 4.0–10.5)
nRBC: 0 % (ref 0.0–0.2)

## 2024-06-19 LAB — AFP, SERUM, OPEN SPINA BIFIDA
AFP MoM: 0.84
AFP Value: 36.5 ng/mL
Gest. Age on Collection Date: 17.4 wk
Maternal Age At EDD: 32.4 a
OSBR Risk 1 IN: 10000
Test Results:: NEGATIVE
Weight: 131 [lb_av]

## 2024-06-19 MED ORDER — MONTELUKAST SODIUM 10 MG PO TABS: 10.0000 mg | ORAL_TABLET | Freq: Every day | ORAL | 0 refills | Status: DC

## 2024-06-19 MED ORDER — ALBUTEROL SULFATE (2.5 MG/3ML) 0.083% IN NEBU
2.5000 mg | INHALATION_SOLUTION | Freq: Once | RESPIRATORY_TRACT | Status: AC
  Administered 2024-06-19: 2.5 mg via RESPIRATORY_TRACT
  Filled 2024-06-19: qty 3

## 2024-06-19 MED ORDER — AMOXICILLIN-POT CLAVULANATE 875-125 MG PO TABS: 1.0000 | ORAL_TABLET | Freq: Two times a day (BID) | ORAL | 0 refills | Status: AC
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
## 2024-06-19 NOTE — MAU Note (Signed)
 Sheena E Litaker is a 33 y.o. at [redacted]w[redacted]d here in MAU reporting: seen earlier this week by OB, ? Allergies/ asthma.  Has been coughing for two wks.  No one else at home is having problems.  Has gotten worse.  Feels like chest is being squeezed , is light headed and coughing.  Productive cough in the mornings, green/yellow at times- usually first thing in the morning, then becomes clear.  Chest pain has her concerned, hard to take a deep breath.  Cough is increased.  Has used her inhaler, has not done any nebulizer treatments.  Coughed so hard yesterday, she felt slight pelvic pressure Pees every time she coughs.  Onset of complaint: 2 wks, getting worse. Pain score: chest 4, pelvic 4 Vitals:   06/19/24 1320 06/19/24 1323  BP:  117/84  Pulse:  (!) 118  Resp:  18  Temp:  99.2 F (37.3 C)  SpO2: 97% 97%     FHT:150 Lab orders placed from triage:

## 2024-06-19 NOTE — Discharge Instructions (Signed)
 Follow up with your OB provider and call if any rashes or reactions occur with the antibiotic. Use Delsym daily as directed for the coup and Singulair  at night daily

## 2024-06-20 ENCOUNTER — Other Ambulatory Visit: Payer: Self-pay | Admitting: *Deleted

## 2024-06-20 ENCOUNTER — Telehealth: Payer: Self-pay | Admitting: Family Medicine

## 2024-06-20 MED ORDER — ALBUTEROL SULFATE (2.5 MG/3ML) 0.083% IN NEBU: 2.5000 mg | INHALATION_SOLUTION | Freq: Four times a day (QID) | RESPIRATORY_TRACT | 0 refills | Status: DC | PRN

## 2024-06-20 NOTE — Progress Notes (Signed)
 Patient's call returned and two patient identifiers were used.  Patient stated that the squeeze in her chest has returned and her albuterol  inhaler is not helping; and that the nebulizer treatment she received yesterday in MAU did.  She has had albuterol  nebulizer treatment prescription for home in the past but there are no refills on it.  Refill sent to Arloa Prior at [facility].  Notified patient that prescription was sent.

## 2024-06-20 NOTE — Telephone Encounter (Signed)
 This patient is on the phone she went to the MAU yesterday she was diagnosed with pneumonia which she was treated for but was not treated for her asthma. She says that her chest is tight and she is still coughing bad and wants to know what she should do.

## 2024-06-20 NOTE — Telephone Encounter (Signed)
 Patient's concern addressed in another encounter.

## 2024-06-23 ENCOUNTER — Other Ambulatory Visit: Payer: Self-pay | Admitting: *Deleted

## 2024-06-25 ENCOUNTER — Inpatient Hospital Stay (HOSPITAL_COMMUNITY)
Admission: AD | Admit: 2024-06-25 | Discharge: 2024-06-25 | Disposition: A | Discharge status: Home / Self Care | Payer: Self-pay | Attending: Family Medicine | Admitting: Family Medicine

## 2024-06-25 ENCOUNTER — Other Ambulatory Visit: Payer: Self-pay

## 2024-06-25 DIAGNOSIS — Z3A18 18 weeks gestation of pregnancy: Secondary | ICD-10-CM | POA: Diagnosis not present

## 2024-06-25 DIAGNOSIS — R1030 Lower abdominal pain, unspecified: Secondary | ICD-10-CM | POA: Diagnosis not present

## 2024-06-25 DIAGNOSIS — J45909 Unspecified asthma, uncomplicated: Secondary | ICD-10-CM | POA: Insufficient documentation

## 2024-06-25 DIAGNOSIS — O26892 Other specified pregnancy related conditions, second trimester: Secondary | ICD-10-CM | POA: Insufficient documentation

## 2024-06-25 DIAGNOSIS — N393 Stress incontinence (female) (male): Secondary | ICD-10-CM | POA: Diagnosis not present

## 2024-06-25 DIAGNOSIS — O99512 Diseases of the respiratory system complicating pregnancy, second trimester: Secondary | ICD-10-CM | POA: Insufficient documentation

## 2024-06-25 DIAGNOSIS — R051 Acute cough: Secondary | ICD-10-CM

## 2024-06-25 LAB — URINALYSIS, ROUTINE W REFLEX MICROSCOPIC
Bilirubin Urine: NEGATIVE
Glucose, UA: NEGATIVE mg/dL
Hgb urine dipstick: NEGATIVE
Ketones, ur: NEGATIVE mg/dL
Leukocytes,Ua: NEGATIVE
Nitrite: NEGATIVE
Protein, ur: NEGATIVE mg/dL
Specific Gravity, Urine: 1.006 (ref 1.005–1.030)
pH: 6 (ref 5.0–8.0)

## 2024-06-25 LAB — WET PREP, GENITAL
Clue Cells Wet Prep HPF POC: NONE SEEN
Sperm: NONE SEEN
Trich, Wet Prep: NONE SEEN
WBC, Wet Prep HPF POC: <10 (ref <10)
Yeast Wet Prep HPF POC: NONE SEEN

## 2024-06-25 LAB — POCT FERN TEST: POCT Fern Test: NEGATIVE

## 2024-06-25 NOTE — MAU Note (Signed)
 Whitney Gonzalez is a 33 y.o. at [redacted]w[redacted]d here in MAU reporting: cramping lasting about 20 mins each hour. I was feeling her a lot when the cramps happened but now I am not. Bloody discharged noted today on pad (orange or pink), vaginal pressure that has gotten worse. I was diagnosed with Pneumonia last week and leaking urine when coughing. Unable to tell if any of this is amnionic fluid. Has been using inhaler for cough.  Denies sex  LMP:  Onset of complaint: pressure yesterday, discharge and cramping today.  Pain score: 2/10 Vitals:   06/25/24 1658  BP: 127/82  Pulse: (!) 110  Resp: 16  Temp: 99.1 F (37.3 C)  SpO2: 98%     FHT: 160  Lab orders placed from triage: ua
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
## 2024-06-26 LAB — GC/CHLAMYDIA PROBE AMP (~~LOC~~) NOT AT ARMC
Chlamydia: NEGATIVE
Comment: NEGATIVE
Comment: NORMAL
Neisseria Gonorrhea: NEGATIVE

## 2024-07-04 ENCOUNTER — Other Ambulatory Visit: Payer: Self-pay | Admitting: *Deleted

## 2024-07-04 ENCOUNTER — Ambulatory Visit

## 2024-07-04 ENCOUNTER — Ambulatory Visit: Attending: Family Medicine | Admitting: Obstetrics and Gynecology

## 2024-07-04 VITALS — BP 99/61 | HR 81

## 2024-07-04 DIAGNOSIS — Z362 Encounter for other antenatal screening follow-up: Secondary | ICD-10-CM

## 2024-07-04 DIAGNOSIS — Z3A19 19 weeks gestation of pregnancy: Secondary | ICD-10-CM | POA: Diagnosis not present

## 2024-07-04 DIAGNOSIS — O0990 Supervision of high risk pregnancy, unspecified, unspecified trimester: Secondary | ICD-10-CM

## 2024-07-04 DIAGNOSIS — Z363 Encounter for antenatal screening for malformations: Secondary | ICD-10-CM | POA: Insufficient documentation

## 2024-07-04 DIAGNOSIS — Z7982 Long term (current) use of aspirin: Secondary | ICD-10-CM | POA: Insufficient documentation

## 2024-07-04 DIAGNOSIS — O0992 Supervision of high risk pregnancy, unspecified, second trimester: Secondary | ICD-10-CM | POA: Diagnosis present

## 2024-07-04 DIAGNOSIS — O09292 Supervision of pregnancy with other poor reproductive or obstetric history, second trimester: Secondary | ICD-10-CM | POA: Diagnosis not present

## 2024-07-04 DIAGNOSIS — Z8759 Personal history of other complications of pregnancy, childbirth and the puerperium: Secondary | ICD-10-CM

## 2024-07-04 NOTE — Progress Notes (Signed)
 Maternal-Fetal Medicine Consultation Name: Whitney Gonzalez MRN: [id number]  G5 P1031 at 19w 6d gestation.  Patient is here for fetal anatomy scan. On cell-free fetal DNA screening, the risks of aneuploidies are not increased. MSAFP screening showed low risk for open-neural tube defects. Obstetric history significant for a term vaginal delivery in 2020 of a female infant weighing 6 pounds and 12 ounces at birth.  Patient had gestational hypertension in her previous pregnancy.  She takes low-dose aspirin  prophylaxis.  Ultrasound We performed fetal anatomy scan. No makers of aneuploidies or fetal structural defects are seen. Fetal biometry is consistent with her previously-established dates. Amniotic fluid is normal and good fetal activity is seen. Patient understands the limitations of ultrasound in detecting fetal anomalies.   History of gestational hypertension/preeclampsia  I counseled the patient that history of gestational hypertension is associated with a 25% to 50% recurrence rate of gestational hypertension/preeclampsia in subsequent pregnancies.  Patient has a different partner that may modify the risk.  Low-dose aspirin  prophylaxis delays or prevents preeclampsia and I encouraged her to continue low-dose aspirin .  Recommendations - An appointment was made for her to return in 9 weeks for fetal growth assessment (and complete anatomy-evaluate aortic arch).    Consultation including face-to-face (more than 50%) counseling 15 minutes.

## 2024-07-07 ENCOUNTER — Other Ambulatory Visit: Payer: Self-pay

## 2024-07-07 ENCOUNTER — Ambulatory Visit: Admitting: Family Medicine

## 2024-07-07 VITALS — BP 109/76 | HR 92 | Wt 135.0 lb

## 2024-07-07 DIAGNOSIS — Z3A20 20 weeks gestation of pregnancy: Secondary | ICD-10-CM

## 2024-07-07 DIAGNOSIS — F419 Anxiety disorder, unspecified: Secondary | ICD-10-CM

## 2024-07-07 DIAGNOSIS — O99512 Diseases of the respiratory system complicating pregnancy, second trimester: Secondary | ICD-10-CM

## 2024-07-07 DIAGNOSIS — O0992 Supervision of high risk pregnancy, unspecified, second trimester: Secondary | ICD-10-CM | POA: Diagnosis not present

## 2024-07-07 DIAGNOSIS — N393 Stress incontinence (female) (male): Secondary | ICD-10-CM | POA: Diagnosis not present

## 2024-07-07 DIAGNOSIS — O0990 Supervision of high risk pregnancy, unspecified, unspecified trimester: Secondary | ICD-10-CM

## 2024-07-07 DIAGNOSIS — Z8759 Personal history of other complications of pregnancy, childbirth and the puerperium: Secondary | ICD-10-CM

## 2024-07-07 DIAGNOSIS — F32A Depression, unspecified: Secondary | ICD-10-CM

## 2024-07-07 DIAGNOSIS — J189 Pneumonia, unspecified organism: Secondary | ICD-10-CM

## 2024-07-07 NOTE — Patient Instructions (Signed)

## 2024-07-07 NOTE — Progress Notes (Unsigned)
   PRENATAL VISIT NOTE  Subjective:  Whitney Gonzalez is a 33 y.o. [phone] at [redacted]w[redacted]d being seen today for ongoing prenatal care.  She is currently monitored for the following issues for this {Blank single:19197::high-risk,low-risk} pregnancy and has Bipolar I disorder (HCC); Hypothyroidism; Arthralgia; Chronic fatigue; De Quervain's tenosynovitis, left; Mass of left side of neck; Supervision of high risk pregnancy, antepartum; Anxiety and depression; Uncomplicated asthma; and History of gestational hypertension on their problem list.  Patient reports {sx:14538}.  Contractions: Not present. Vag. Bleeding: None.  Movement: Present. Denies leaking of fluid.   The following portions of the patient's history were reviewed and updated as appropriate: allergies, current medications, past family history, past medical history, past social history, past surgical history and problem list.   Objective:    Vitals:   07/07/24 1534  BP: 109/76  Pulse: 92  Weight: 135 lb (61.2 kg)    Fetal Status:  Fetal Heart Rate (bpm): 150   Movement: Present    General: Alert, oriented and cooperative. Patient is in no acute distress.  Skin: Skin is warm and dry. No rash noted.   Cardiovascular: Normal heart rate noted  Respiratory: Normal respiratory effort, no problems with respiration noted  Abdomen: Soft, gravid, appropriate for gestational age.  Pain/Pressure: Present (hemorrhoid pain)     Pelvic: {Blank single:19197::Cervical exam performed in the presence of a chaperone,Cervical exam deferred}        Extremities: Normal range of motion.  Edema: None  Mental Status: Normal mood and affect. Normal behavior. Normal judgment and thought content.   Assessment and Plan:  Pregnancy: G5P1031 at [redacted]w[redacted]d 1. Supervision of high risk pregnancy, antepartum (Primary) ***  2. Stress incontinence of urine ***  3. History of gestational hypertension ***  4. Anxiety and depression ***  5. Pneumonia  affecting pregnancy in second trimester ***  6. [redacted] weeks gestation of pregnancy ***  {Blank single:19197::Term,Preterm} labor symptoms and general obstetric precautions including but not limited to vaginal bleeding, contractions, leaking of fluid and fetal movement were reviewed in detail with the patient. Please refer to After Visit Summary for other counseling recommendations.   No follow-ups on file.  Future Appointments  Date Time Provider Department Center  07/15/2024  4:15 PM Nicholaus Burnard HERO, MD [facility]  08/05/2024  3:55 PM Eveline Lynwood MATSU, MD [facility]  09/02/2024 10:55 AM Nicholaus Burnard HERO, MD [facility]  09/08/2024 11:00 AM [facility] PROVIDER 1 [facility]  09/08/2024 11:30 AM [facility] US2 [facility]    Norleen LULLA Rover, MD

## 2024-07-15 ENCOUNTER — Encounter: Admitting: Obstetrics and Gynecology

## 2024-08-05 ENCOUNTER — Other Ambulatory Visit: Payer: Self-pay

## 2024-08-05 ENCOUNTER — Ambulatory Visit: Admitting: Obstetrics & Gynecology

## 2024-08-05 VITALS — BP 131/72 | HR 91 | Wt 139.5 lb

## 2024-08-05 DIAGNOSIS — Z8759 Personal history of other complications of pregnancy, childbirth and the puerperium: Secondary | ICD-10-CM | POA: Diagnosis not present

## 2024-08-05 DIAGNOSIS — O0992 Supervision of high risk pregnancy, unspecified, second trimester: Secondary | ICD-10-CM

## 2024-08-05 DIAGNOSIS — O0990 Supervision of high risk pregnancy, unspecified, unspecified trimester: Secondary | ICD-10-CM

## 2024-08-05 DIAGNOSIS — Z3A24 24 weeks gestation of pregnancy: Secondary | ICD-10-CM

## 2024-08-05 MED ORDER — PREDNISONE 10 MG PO TABS: 10.0000 mg | ORAL_TABLET | Freq: Every day | ORAL | 0 refills | Status: DC

## 2024-08-05 NOTE — Progress Notes (Signed)
   PRENATAL VISIT NOTE  Subjective:  Whitney Gonzalez is a 33 y.o. [phone] at [redacted]w[redacted]d being seen today for ongoing prenatal care.  She is currently monitored for the following issues for this high-risk pregnancy and has Bipolar I disorder (HCC); Hypothyroidism; Arthralgia; Chronic fatigue; De Quervain's tenosynovitis, left; Mass of left side of neck; Supervision of high risk pregnancy, antepartum; Anxiety and depression; Uncomplicated asthma; and History of gestational hypertension on their problem list.  Patient reports cough and congestion following pneumonia.  Contractions: Not present. Vag. Bleeding: None.  Movement: Present. Denies leaking of fluid.   The following portions of the patient's history were reviewed and updated as appropriate: allergies, current medications, past family history, past medical history, past social history, past surgical history and problem list.   Objective:    Vitals:   08/05/24 1638  BP: 131/72  Pulse: 91  Weight: 139 lb 8 oz (63.3 kg)    Fetal Status:  Fetal Heart Rate (bpm): 139   Movement: Present    General: Alert, oriented and cooperative. Patient is in no acute distress.  Skin: Skin is warm and dry. No rash noted.   Cardiovascular: Normal heart rate noted  Respiratory: Normal respiratory effort, no problems with respiration noted  Abdomen: Soft, gravid, appropriate for gestational age.  Pain/Pressure: Absent     Pelvic: Cervical exam deferred        Extremities: Normal range of motion.  Edema: None  Mental Status: Normal mood and affect. Normal behavior. Normal judgment and thought content.   Assessment and Plan:  Pregnancy: G5P1031 at [redacted]w[redacted]d 1. Supervision of high risk pregnancy, antepartum (Primary) Chest congestion, no wheezing - predniSONE  (DELTASONE ) 10 MG tablet; Take 1 tablet (10 mg total) by mouth daily with breakfast.  Dispense: 7 tablet; Refill: 0  2. History of gestational hypertension   3. [redacted] weeks gestation of  pregnancy   Preterm labor symptoms and general obstetric precautions including but not limited to vaginal bleeding, contractions, leaking of fluid and fetal movement were reviewed in detail with the patient. Please refer to After Visit Summary for other counseling recommendations.   Return in about 4 weeks (around 09/02/2024).  Future Appointments  Date Time Provider Department Center  09/02/2024  8:20 AM [facility]  09/02/2024  8:55 AM Nicholaus Burnard HERO, MD [facility]  09/08/2024 11:00 AM [facility] PROVIDER 1 [facility]  09/08/2024 11:30 AM [facility] US2 [facility]  09/08/2024  2:00 PM Elnor Channing FALCON, PT [facility] None    Lynwood Solomons, MD

## 2024-08-11 ENCOUNTER — Other Ambulatory Visit: Payer: Self-pay

## 2024-08-11 DIAGNOSIS — O0990 Supervision of high risk pregnancy, unspecified, unspecified trimester: Secondary | ICD-10-CM

## 2024-08-11 MED ORDER — MONTELUKAST SODIUM 10 MG PO TABS: 10.0000 mg | ORAL_TABLET | Freq: Every day | ORAL | 4 refills | Status: AC

## 2024-08-11 MED ORDER — PREDNISONE 10 MG PO TABS: 10.0000 mg | ORAL_TABLET | Freq: Every day | ORAL | 1 refills | Status: DC

## 2024-08-12 ENCOUNTER — Encounter: Admitting: Obstetrics & Gynecology

## 2024-08-25 NOTE — Progress Notes (Unsigned)
   PRENATAL VISIT NOTE  Subjective:  Whitney Gonzalez is a 33 y.o. [phone] at [redacted]w[redacted]d being seen today for ongoing prenatal care.  She is currently monitored for the following issues for this {Blank single:19197::high-risk,low-risk} pregnancy and has Bipolar I disorder (HCC); Hypothyroidism; Arthralgia; Chronic fatigue; De Quervain's tenosynovitis, left; Mass of left side of neck; Supervision of high risk pregnancy, antepartum; Anxiety and depression; Uncomplicated asthma; and History of gestational hypertension on their problem list.  Patient reports {sx:14538}.   .  .   . Denies leaking of fluid.   The following portions of the patient's history were reviewed and updated as appropriate: allergies, current medications, past family history, past medical history, past social history, past surgical history and problem list.   Objective:    There were no vitals filed for this visit.  Fetal Status:           General: Alert, oriented and cooperative. Patient is in no acute distress.  Skin: Skin is warm and dry. No rash noted.   Cardiovascular: Normal heart rate noted  Respiratory: Normal respiratory effort, no problems with respiration noted  Abdomen: Soft, gravid, appropriate for gestational age.        Pelvic: {Blank single:19197::Cervical exam performed in the presence of a chaperone,Cervical exam deferred}        Extremities: Normal range of motion.     Mental Status: Normal mood and affect. Normal behavior. Normal judgment and thought content.   Assessment and Plan:  Pregnancy: G5P1031 at [redacted]w[redacted]d 1. Supervision of high risk pregnancy, antepartum (Primary) ***  2. [redacted] weeks gestation of pregnancy ***  3. History of gestational hypertension ***  4. Anxiety and depression ***  5. Uncomplicated asthma, unspecified asthma severity, unspecified whether persistent ***  {Blank single:19197::Term,Preterm} labor symptoms and general obstetric precautions including but not  limited to vaginal bleeding, contractions, leaking of fluid and fetal movement were reviewed in detail with the patient. Please refer to After Visit Summary for other counseling recommendations.   Return in about 1 week (around 09/02/2024) for HROB with GTT.  Future Appointments  Date Time Provider Department Center  08/26/2024  3:15 PM Regino Camie DELENA EDDY [facility]  09/02/2024  8:20 AM [facility]  09/02/2024  8:55 AM Nicholaus Burnard HERO, MD [facility]  09/08/2024 11:00 AM [facility] PROVIDER 1 [facility]  09/08/2024 11:30 AM [facility] US2 [facility]  09/08/2024  2:00 PM Elnor Channing FALCON, PT [facility] None    Camie DELENA Regino, CNM

## 2024-08-26 ENCOUNTER — Other Ambulatory Visit: Payer: Self-pay

## 2024-08-26 ENCOUNTER — Ambulatory Visit (INDEPENDENT_AMBULATORY_CARE_PROVIDER_SITE_OTHER): Admitting: Certified Nurse Midwife

## 2024-08-26 VITALS — BP 120/74 | HR 88 | Wt 141.1 lb

## 2024-08-26 DIAGNOSIS — Z8759 Personal history of other complications of pregnancy, childbirth and the puerperium: Secondary | ICD-10-CM

## 2024-08-26 DIAGNOSIS — F419 Anxiety disorder, unspecified: Secondary | ICD-10-CM | POA: Diagnosis not present

## 2024-08-26 DIAGNOSIS — F32A Depression, unspecified: Secondary | ICD-10-CM

## 2024-08-26 DIAGNOSIS — O0990 Supervision of high risk pregnancy, unspecified, unspecified trimester: Secondary | ICD-10-CM

## 2024-08-26 DIAGNOSIS — O0992 Supervision of high risk pregnancy, unspecified, second trimester: Secondary | ICD-10-CM | POA: Diagnosis not present

## 2024-08-26 DIAGNOSIS — J45909 Unspecified asthma, uncomplicated: Secondary | ICD-10-CM

## 2024-08-26 DIAGNOSIS — Z3A27 27 weeks gestation of pregnancy: Secondary | ICD-10-CM

## 2024-08-26 DIAGNOSIS — R002 Palpitations: Secondary | ICD-10-CM

## 2024-08-26 DIAGNOSIS — N6310 Unspecified lump in the right breast, unspecified quadrant: Secondary | ICD-10-CM

## 2024-08-26 MED ORDER — FLUTICASONE PROPIONATE 50 MCG/ACT NA SUSP: 2.0000 | Freq: Two times a day (BID) | NASAL | 2 refills | Status: DC

## 2024-08-26 NOTE — Patient Instructions (Signed)
  The 2 Hour Glucose Tolerance Test  A two-hour glucose tolerance test is a screening for gestational diabetes during pregnancy. It involves fasting, drinking a glucose (sugar) solution, and having blood drawn multiple times.   How to Prepare: Eat normally in the days before the test.   Eat something with a good bit of protein the night before the test - this can be done up to midnight. Think nuts, meat, cheeses.  Avoid food and drink for at least 8 to 12 hours before the test, except for a few sips of water. The test is usually scheduled in the morning so this means no food or drink (except small sips of water or medicines) after midnight the night before.  Do not chew gum (even sugar-free), have candy, drink coffee or tea.   Tell your healthcare provider about all medications, herbs, vitamins, and supplements you take.   Test procedure:  Have blood drawn to check your blood sugar level after fasting. Drink a liquid that contains 75 grams of glucose. Have blood drawn again every 60 minutes for two hours.  Abnormal results: Abnormal blood values for a two-hour glucose tolerance test include:   Fasting: greater than or equal to 92 mg/dL (5.1 mmol/L) 1 hour: greater than or equal to 180 mg/dL (89.9 mmol/L) 2 hour: greater than or equal to 153 mg/dL (8.5 mmol/L)  When it's performed: Most pregnant patients have a glucose screening test between 24 and 28 weeks of pregnancy. The test is sometimes done early if you have a higher risk for diabetes.

## 2024-09-01 ENCOUNTER — Other Ambulatory Visit: Payer: Self-pay

## 2024-09-01 DIAGNOSIS — Z3A28 28 weeks gestation of pregnancy: Secondary | ICD-10-CM

## 2024-09-02 ENCOUNTER — Ambulatory Visit: Admitting: Obstetrics and Gynecology

## 2024-09-02 ENCOUNTER — Other Ambulatory Visit

## 2024-09-02 ENCOUNTER — Other Ambulatory Visit: Payer: Self-pay

## 2024-09-02 ENCOUNTER — Encounter: Payer: Self-pay | Admitting: Obstetrics and Gynecology

## 2024-09-02 VITALS — BP 121/84 | HR 85 | Wt 145.6 lb

## 2024-09-02 DIAGNOSIS — E039 Hypothyroidism, unspecified: Secondary | ICD-10-CM | POA: Diagnosis not present

## 2024-09-02 DIAGNOSIS — O0993 Supervision of high risk pregnancy, unspecified, third trimester: Secondary | ICD-10-CM

## 2024-09-02 DIAGNOSIS — Z3A28 28 weeks gestation of pregnancy: Secondary | ICD-10-CM

## 2024-09-02 DIAGNOSIS — O0990 Supervision of high risk pregnancy, unspecified, unspecified trimester: Secondary | ICD-10-CM

## 2024-09-02 DIAGNOSIS — Z8759 Personal history of other complications of pregnancy, childbirth and the puerperium: Secondary | ICD-10-CM

## 2024-09-02 DIAGNOSIS — F32A Depression, unspecified: Secondary | ICD-10-CM

## 2024-09-02 DIAGNOSIS — F419 Anxiety disorder, unspecified: Secondary | ICD-10-CM

## 2024-09-02 DIAGNOSIS — Z209 Contact with and (suspected) exposure to unspecified communicable disease: Secondary | ICD-10-CM

## 2024-09-02 NOTE — Progress Notes (Signed)
   PRENATAL VISIT NOTE  Subjective:  Whitney Gonzalez is a 33 y.o. [phone] at [redacted]w[redacted]d being seen today for ongoing prenatal care.  She is currently monitored for the following issues for this high-risk pregnancy and has Bipolar I disorder (HCC); Hypothyroidism; Arthralgia; Chronic fatigue; De Quervain's tenosynovitis, left; Mass of left side of neck; Gastroesophageal reflux disease; Excessive and frequent menstruation; Finding of above normal blood pressure; Supervision of high risk pregnancy, antepartum; Anxiety and depression; Uncomplicated asthma; and History of gestational hypertension on their problem list.  Patient reports occasional contractions, pulling/pressure.  Contractions: Irritability. Vag. Bleeding: None.  Movement: Present. Denies leaking of fluid.   The following portions of the patient's history were reviewed and updated as appropriate: allergies, current medications, past family history, past medical history, past social history, past surgical history and problem list.   Objective:    Vitals:   09/02/24 0835  BP: 121/84  Pulse: 85  Weight: 145 lb 9.6 oz (66 kg)    Fetal Status:  Fetal Heart Rate (bpm): 142   Movement: Present    General: Alert, oriented and cooperative. Patient is in no acute distress.  Skin: Skin is warm and dry. No rash noted.   Cardiovascular: Normal heart rate noted  Respiratory: Normal respiratory effort, no problems with respiration noted  Abdomen: Soft, gravid, appropriate for gestational age.  Pain/Pressure: Present     Pelvic: Cervical exam deferred        Extremities: Normal range of motion.  Edema: Trace (hands)  Mental Status: Normal mood and affect. Normal behavior. Normal judgment and thought content.   Assessment and Plan:  Pregnancy: G5P1031 at [redacted]w[redacted]d  1. Supervision of high risk pregnancy, antepartum (Primary) 3rd trim labs today Counseled regarding risks/benefits of Tdap vaccine, patient declines vaccine.   2. Anxiety and  depression No meds  3. History of gestational hypertension Cont baby aspirin   4. Hypothyroidism, unspecified type Recheck TSH, fT4 today  5. [redacted] weeks gestation of pregnancy  6. Exposure to potential infection Husband has history of ureaplasma which was treated, he would like her to be tested - she has no symptoms - I reviewed that there is no data regarding testing for Ureaplasma during pregnancy and not data regarding outcomes of pregnancy in the setting of Ureaplasma, also would not plan to treat ureaplasma if positive as txt option not considered safe in pregnancy - she would like to get tested anyway   Preterm labor symptoms and general obstetric precautions including but not limited to vaginal bleeding, contractions, leaking of fluid and fetal movement were reviewed in detail with the patient. Please refer to After Visit Summary for other counseling recommendations.   Return in about 2 weeks (around 09/16/2024) for high OB.  Future Appointments  Date Time Provider Department Center  09/08/2024 11:00 AM [facility] PROVIDER 1 [facility]  09/08/2024 11:30 AM [facility] US2 [facility]  09/08/2024  2:00 PM Elnor Channing FALCON, PT [facility] None  09/16/2024  3:55 PM Izell Harari, MD [facility]  09/26/2024  3:40 PM Sheena Pugh, DO [facility] None    Burnard CHRISTELLA Moats, MD

## 2024-09-03 LAB — CBC
Hematocrit: 37.4 % (ref 34.0–46.6)
Hemoglobin: 12.5 g/dL (ref 11.1–15.9)
MCH: 32.7 pg (ref 26.6–33.0)
MCHC: 33.4 g/dL (ref 31.5–35.7)
MCV: 98 fL — ABNORMAL HIGH (ref 79–97)
Platelets: 218 x10E3/uL (ref 150–450)
RBC: 3.82 x10E6/uL (ref 3.77–5.28)
RDW: 13 % (ref 11.7–15.4)
WBC: 8 x10E3/uL (ref 3.4–10.8)

## 2024-09-03 LAB — GLUCOSE TOLERANCE, 2 HOURS W/ 1HR
Glucose, 1 hour: 131 mg/dL (ref 70–179)
Glucose, 2 hour: 126 mg/dL (ref 70–152)
Glucose, Fasting: 73 mg/dL (ref 70–91)

## 2024-09-03 LAB — RPR: RPR Ser Ql: NONREACTIVE

## 2024-09-03 LAB — MYCOPLASMA / UREAPLASMA CULTURE

## 2024-09-03 LAB — TSH+FREE T4
Free T4: 0.78 ng/dL — ABNORMAL LOW (ref 0.82–1.77)
TSH: 2.96 u[IU]/mL (ref 0.450–4.500)

## 2024-09-03 LAB — HIV ANTIBODY (ROUTINE TESTING W REFLEX): HIV Screen 4th Generation wRfx: NONREACTIVE
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
## 2024-09-06 ENCOUNTER — Ambulatory Visit: Payer: Self-pay | Admitting: Obstetrics and Gynecology

## 2024-09-06 MED ORDER — LEVOTHYROXINE SODIUM 25 MCG PO TABS: 25.0000 ug | ORAL_TABLET | Freq: Every day | ORAL | 2 refills | Status: DC

## 2024-09-08 ENCOUNTER — Other Ambulatory Visit: Payer: Self-pay | Admitting: *Deleted

## 2024-09-08 ENCOUNTER — Other Ambulatory Visit: Payer: Self-pay

## 2024-09-08 ENCOUNTER — Encounter: Payer: Self-pay | Admitting: Physical Therapy

## 2024-09-08 ENCOUNTER — Other Ambulatory Visit: Payer: Self-pay | Admitting: Obstetrics and Gynecology

## 2024-09-08 ENCOUNTER — Ambulatory Visit

## 2024-09-08 ENCOUNTER — Ambulatory Visit: Attending: Family Medicine | Admitting: Physical Therapy

## 2024-09-08 ENCOUNTER — Ambulatory Visit: Attending: Obstetrics and Gynecology | Admitting: Maternal & Fetal Medicine

## 2024-09-08 VITALS — BP 114/75 | HR 101

## 2024-09-08 DIAGNOSIS — Z362 Encounter for other antenatal screening follow-up: Secondary | ICD-10-CM | POA: Diagnosis present

## 2024-09-08 DIAGNOSIS — R278 Other lack of coordination: Secondary | ICD-10-CM | POA: Insufficient documentation

## 2024-09-08 DIAGNOSIS — Z3A29 29 weeks gestation of pregnancy: Secondary | ICD-10-CM

## 2024-09-08 DIAGNOSIS — E039 Hypothyroidism, unspecified: Secondary | ICD-10-CM

## 2024-09-08 DIAGNOSIS — O99283 Endocrine, nutritional and metabolic diseases complicating pregnancy, third trimester: Secondary | ICD-10-CM

## 2024-09-08 DIAGNOSIS — O09293 Supervision of pregnancy with other poor reproductive or obstetric history, third trimester: Secondary | ICD-10-CM | POA: Diagnosis not present

## 2024-09-08 DIAGNOSIS — Z8759 Personal history of other complications of pregnancy, childbirth and the puerperium: Secondary | ICD-10-CM

## 2024-09-08 DIAGNOSIS — O0990 Supervision of high risk pregnancy, unspecified, unspecified trimester: Secondary | ICD-10-CM

## 2024-09-08 DIAGNOSIS — N393 Stress incontinence (female) (male): Secondary | ICD-10-CM | POA: Insufficient documentation

## 2024-09-08 NOTE — Progress Notes (Signed)
 Patient information  Patient Name: Whitney Gonzalez  Patient MRN:   [id number]  Referring practice: MFM Referring Provider: [facility] Gonzalez)  Problem List   Patient Active Problem List   Diagnosis Date Noted   Anxiety and depression 05/19/2024   Uncomplicated asthma 05/19/2024   History of gestational hypertension 05/19/2024   Supervision of high risk pregnancy, antepartum 05/15/2024   Excessive and frequent menstruation 05/12/2024   Finding of above normal blood pressure 05/12/2024   Gastroesophageal reflux disease 04/11/2022   Mass of left side of neck 08/15/2021   De Quervain's tenosynovitis, left 10/27/2019   Arthralgia 08/06/2019   Chronic fatigue 08/06/2019   Hypothyroidism 05/12/2019   Bipolar I disorder (HCC) 05/28/2012   Maternal Fetal medicine Consult  Whitney Gonzalez is a 33 y.o. G5P1031 at [redacted]w[redacted]d here for ultrasound and consultation. Whitney Gonzalez is doing well today with no acute concerns. Today we focused on the following:   The patient was recently diagnosed with a possible thyroid  condition.  She was experiencing extreme fatigue and had a TSH drawn as well as a free T4.  Her free T4 was slightly below normal.  I encouraged the patient that this likely has little to no effect on her fetus given how close to the normal range the hormone level is at this time.  She was prescribed Synthroid which is okay to take during pregnancy.  She will need to have her thyroid  levels as well as thyroid  receptor antibodies checked 12 weeks after delivery to see if she truly has a thyroid  problem.  During her pregnancy her free T4 and TSH can be checked every 4 to 6 weeks with the dose of her thyroid  medicine adjusted as needed to keep the free T4 in the upper limit of normal.  I discussed the fetal growth is at the lower end of normal and that this is likely consistent with her genetic potential given that she is only 5 feet tall.     Sonographic findings Single intrauterine pregnancy at 29w 2d.  Fetal cardiac activity:  Observed and appears normal. Presentation: Breech. Interval fetal anatomy appears normal. Fetal biometry shows the estimated fetal weight at the 17 percentile. Amniotic fluid volume: Within normal limits. MVP: 6.13 cm. Placenta: Posterior.  There are limitations of prenatal ultrasound such as the inability to detect certain abnormalities due to poor visualization. Various factors such as fetal position, gestational age and maternal body habitus may increase the difficulty in visualizing the fetal anatomy.    Recommendations -Follow-up growth ultrasound in 4 weeks -T4 and TSH can be checked every 4 to 6 weeks with the dose of her thyroid  medicine adjusted as needed to keep the free T4 in the upper limit of normal -12 weeks after delivery thyroid  levels should be assessed with thyroid  antibodies checked to see if she has an autoimmune thyroid  condition.  Review of Systems: A review of systems was performed and was negative except per HPI   Vitals and Physical Exam    09/08/2024   11:03 AM 09/02/2024    8:35 AM 08/26/2024    3:18 PM  Vitals with BMI  Weight  145 lbs 10 oz 141 lbs 2 oz  Systolic 114 121 879  Diastolic 75 84 74  Pulse 101 85 88  Sitting comfortably on the sonogram table Nonlabored breathing Normal rate and rhythm Abdomen is nontender  Past pregnancies OB History  Gravida Para Term Preterm AB Living  5  1 1 0 3 1  SAB IAB Ectopic Multiple Live Births  3 0 0 0 1    # Outcome Date GA Lbr Len/2nd Weight Sex Type Anes PTL Lv  5 Current           4 Term 05/14/19 [redacted]w[redacted]d 32:28 / 00:30 6 lb 12 oz (3.062 kg) M Vag-Spont EPI  LIV  3 SAB           2 SAB           1 SAB              I spent 30 minutes reviewing the patients chart, including labs and images as well as counseling the patient about her medical conditions. Greater than 50% of the time was spent in direct face-to-face  patient counseling.  Whitney Gonzalez  MFM, [place]   09/08/2024  11:50 AM

## 2024-09-16 ENCOUNTER — Ambulatory Visit: Admitting: Obstetrics and Gynecology

## 2024-09-16 ENCOUNTER — Other Ambulatory Visit: Payer: Self-pay

## 2024-09-16 VITALS — BP 110/78 | HR 105 | Wt 149.0 lb

## 2024-09-16 DIAGNOSIS — Z202 Contact with and (suspected) exposure to infections with a predominantly sexual mode of transmission: Secondary | ICD-10-CM | POA: Diagnosis not present

## 2024-09-16 DIAGNOSIS — O0993 Supervision of high risk pregnancy, unspecified, third trimester: Secondary | ICD-10-CM

## 2024-09-16 DIAGNOSIS — J45909 Unspecified asthma, uncomplicated: Secondary | ICD-10-CM | POA: Diagnosis not present

## 2024-09-16 DIAGNOSIS — O0990 Supervision of high risk pregnancy, unspecified, unspecified trimester: Secondary | ICD-10-CM

## 2024-09-16 DIAGNOSIS — Z8759 Personal history of other complications of pregnancy, childbirth and the puerperium: Secondary | ICD-10-CM

## 2024-09-16 DIAGNOSIS — Z3A30 30 weeks gestation of pregnancy: Secondary | ICD-10-CM

## 2024-09-16 DIAGNOSIS — E039 Hypothyroidism, unspecified: Secondary | ICD-10-CM

## 2024-09-16 NOTE — Patient Instructions (Signed)

## 2024-09-16 NOTE — Progress Notes (Unsigned)
 LC to room per provider request, mom stated she has red breast and leaking colostrum.  LC reviewed: Breast changes  Colostrum phase  Ice over heat for breast inflammation No hard massages No harsh soap  Flanged fit  Breast pump recommendation  LC assessed breast, no lumps or hard spots in breast, within normal redden breast from increased blood flow, no rash. Nipples fattened but erect with stimulation.   [place] [employer] IBCLC and [employer] IBCLC
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
## 2024-09-23 LAB — MYCOPLASMA / UREAPLASMA CULTURE
Mycoplasma hominis Culture: NEGATIVE
Ureaplasma urealyticum: NEGATIVE

## 2024-09-24 ENCOUNTER — Telehealth: Payer: Self-pay | Admitting: Family Medicine

## 2024-09-24 NOTE — Telephone Encounter (Signed)
 Patient has requested a call back because she is experiencing painful hemorrhoids. She has tried everything, and nothing is helping.

## 2024-09-25 ENCOUNTER — Telehealth: Payer: Self-pay | Admitting: Family Medicine

## 2024-09-25 DIAGNOSIS — O2243 Hemorrhoids in pregnancy, third trimester: Secondary | ICD-10-CM

## 2024-09-25 MED ORDER — LIDOCAINE HCL 2 % EX GEL: 1.0000 | CUTANEOUS | 2 refills | Status: DC | PRN

## 2024-09-25 MED ORDER — POLYETHYLENE GLYCOL 3350 17 GM/SCOOP PO POWD: 17.0000 g | Freq: Every day | ORAL | 0 refills | Status: DC

## 2024-09-25 NOTE — Telephone Encounter (Signed)
 Patient is following up on her call from yesterday she says she has not spoken to a nurse and she believes she needs to come in for a sooner appointment. A message was sent to clinical staff when the patient called yesterday and she wants me to add that she has developed swelling in her fingers as well. I let the patient know that I have spoke with Nurse Diane who will be returning her call today.

## 2024-09-25 NOTE — Addendum Note (Signed)
 Addended by: MICHAE LOA CROME on: 09/25/2024 03:33 PM   Modules accepted: Orders

## 2024-09-25 NOTE — Telephone Encounter (Signed)
 Called pt and discussed her concern. She stated that since 10/18 she has had increased pelvic pressure which is constant. The hemorrhoid which she has had since birth of her son in 2020 has also become much larger since 10/18. There has been some bleeding from the hemorrhoid and yesterday there was either mucus or pus observed. She has been using Tucks pads and taking epsom salts baths with no relief. She denies constipation. Additionally pt reports that her fingers are swollen. I advised pt that finger swelling can be common during pregnancy and can be transient. She needs to be sure to drink 6-8 glasses of water daily. Dr. Lola was consulted re: hemorrhoid and recommends Miralax  daily, topical Lidocaine  jelly as needed, Preparation H ointment and/or Anusol  HC suppositories and referral to [facility] Surgery if pt desires. Pt was advised of all recommendations. She voiced understanding and desires referral to general surgery as offered. Rx for Lidocaine  jelly and Miralax  e-prescribed.

## 2024-09-26 ENCOUNTER — Ambulatory Visit: Admitting: Cardiology

## 2024-09-26 ENCOUNTER — Ambulatory Visit: Attending: Cardiology

## 2024-09-26 ENCOUNTER — Encounter: Payer: Self-pay | Admitting: Cardiology

## 2024-09-26 VITALS — BP 146/94 | HR 95 | Ht 60.0 in | Wt 150.4 lb

## 2024-09-26 DIAGNOSIS — R002 Palpitations: Secondary | ICD-10-CM

## 2024-09-26 DIAGNOSIS — Z3A32 32 weeks gestation of pregnancy: Secondary | ICD-10-CM

## 2024-09-26 DIAGNOSIS — Z8759 Personal history of other complications of pregnancy, childbirth and the puerperium: Secondary | ICD-10-CM | POA: Diagnosis not present

## 2024-09-26 DIAGNOSIS — R0602 Shortness of breath: Secondary | ICD-10-CM | POA: Diagnosis not present

## 2024-09-26 NOTE — Patient Instructions (Signed)
 Lab Work:  Your physician recommends that you return for lab work AT ANY [employer]  If you have labs (blood work) drawn today and your tests are completely normal, you will receive your results only by: MyChart Message (if you have MyChart) OR A paper copy in the mail If you have any lab test that is abnormal or we need to change your treatment, we will call you to review the results.  Testing/Procedures:  Your physician has requested that you have an echocardiogram. Echocardiography is a painless test that uses sound waves to create images of your heart. It provides your doctor with information about the size and shape of your heart and how well your heart's chambers and valves are working. This procedure takes approximately one hour. There are no restrictions for this procedure. Please do NOT wear cologne, perfume, aftershave, or lotions (deodorant is allowed). Please arrive 15 minutes prior to your appointment time.  Please note: We ask at that you not bring children with you during ultrasound (echo/ vascular) testing. Due to room size and safety concerns, children are not allowed in the ultrasound rooms during exams. Our front office staff cannot provide observation of children in our lobby area while testing is being conducted. An adult accompanying a patient to their appointment will only be allowed in the ultrasound room at the discretion of the ultrasound technician under special circumstances. We apologize for any inconvenience. [address]   ZIO XT- Long Term Monitor Instructions  Your physician has requested you wear a ZIO patch monitor for 7 days.  This is a single patch monitor. [employer] supplies one patch monitor per enrollment. Additional stickers are not available. Please do not apply patch if you will be having a Nuclear Stress Test,  Echocardiogram, Cardiac CT, MRI, or Chest Xray during the period you would be wearing the  monitor. The patch cannot  be worn during these tests. You cannot remove and re-apply the  ZIO XT patch monitor.  Your ZIO patch monitor will be mailed 3 day USPS to your address on file. It may take 3-5 days  to receive your monitor after you have been enrolled.  Once you have received your monitor, please review the enclosed instructions. Your monitor  has already been registered assigning a specific monitor serial # to you.  Billing and Patient Assistance Program Information  We have supplied [employer] with any of your insurance information on file for billing purposes. [employer] offers a sliding scale Patient Assistance Program for patients that do not have  insurance, or whose insurance does not completely cover the cost of the ZIO monitor.  You must apply for the Patient Assistance Program to qualify for this discounted rate.  To apply, please call [employer] at [phone], select option 4, select option 2, ask to apply for  Patient Assistance Program. Meredeth will ask your household income, and how many people  are in your household. They will quote your out-of-pocket cost based on that information.  [employer] will also be able to set up a 36-month, interest-free payment plan if needed.  Applying the monitor   Shave hair from upper left chest.  Hold abrader disc by orange tab. Rub abrader in 40 strokes over the upper left chest as  indicated in your monitor instructions.  Clean area with 4 enclosed alcohol pads. Let dry.  Apply patch as indicated in monitor instructions. Patch will be placed under collarbone on left  side of chest with arrow pointing upward.  Rub patch adhesive wings for 2 minutes. Remove white label marked 1. Remove the white  label marked 2. Rub patch adhesive wings for 2 additional minutes.  While looking in a mirror, press and release button in center of patch. A small green light will  flash 3-4 times. This will be your only indicator that the monitor has been turned on.  Do not shower  for the first 24 hours. You may shower after the first 24 hours.  Press the button if you feel a symptom. You will hear a small click. Record Date, Time and  Symptom in the Patient Logbook.  When you are ready to remove the patch, follow instructions on the last 2 pages of Patient  Logbook. Stick patch monitor onto the last page of Patient Logbook.  Place Patient Logbook in the blue and white box. Use locking tab on box and tape box closed  securely. The blue and white box has prepaid postage on it. Please place it in the mailbox as  soon as possible. Your physician should have your test results approximately 7 days after the  monitor has been mailed back to [facility].  Call [facility] Customer Care at [phone] if you have questions regarding  your ZIO XT patch monitor. Call them immediately if you see an orange light blinking on your  monitor.  If your monitor falls off in less than 4 days, contact our Monitor department at [phone].  If your monitor becomes loose or falls off after 4 days call [employer] at [phone] for  suggestions on securing your monitor   Follow-Up: At [facility], you and your health needs are our priority.  As part of our continuing mission to provide you with exceptional heart care, our providers are all part of one team.  This team includes your primary Cardiologist (physician) and Advanced Practice Providers or APPs (Physician Assistants and Nurse Practitioners) who all work together to provide you with the care you need, when you need it.  Your next appointment:   12 week(s)  Provider:   CRIST HUNTSMAN   Other Instructions  IN 2 WEEKS SEND A LLIST OF BLOOD PRESSURE READINGS TO DR TOBB

## 2024-09-26 NOTE — Progress Notes (Unsigned)
 Enrolled patient for a 7 day Zio XT monitor to be mailed to patients home.
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
## 2024-10-02 ENCOUNTER — Other Ambulatory Visit: Payer: Self-pay

## 2024-10-02 ENCOUNTER — Ambulatory Visit (INDEPENDENT_AMBULATORY_CARE_PROVIDER_SITE_OTHER): Admitting: Obstetrics & Gynecology

## 2024-10-02 VITALS — BP 118/82 | HR 98 | Wt 150.6 lb

## 2024-10-02 DIAGNOSIS — O0993 Supervision of high risk pregnancy, unspecified, third trimester: Secondary | ICD-10-CM

## 2024-10-02 DIAGNOSIS — J45909 Unspecified asthma, uncomplicated: Secondary | ICD-10-CM

## 2024-10-02 DIAGNOSIS — O0990 Supervision of high risk pregnancy, unspecified, unspecified trimester: Secondary | ICD-10-CM

## 2024-10-02 DIAGNOSIS — Z3A32 32 weeks gestation of pregnancy: Secondary | ICD-10-CM | POA: Diagnosis not present

## 2024-10-02 MED ORDER — BUDESONIDE 90 MCG/ACT IN AEPB: 1.0000 | INHALATION_SPRAY | Freq: Two times a day (BID) | RESPIRATORY_TRACT | 2 refills | Status: AC

## 2024-10-02 MED ORDER — ALBUTEROL SULFATE (2.5 MG/3ML) 0.083% IN NEBU: 2.5000 mg | INHALATION_SOLUTION | Freq: Four times a day (QID) | RESPIRATORY_TRACT | 0 refills | Status: DC | PRN

## 2024-10-02 NOTE — Progress Notes (Signed)
   PRENATAL VISIT NOTE  Subjective:  Whitney Gonzalez is a 33 y.o. [phone] at [redacted]w[redacted]d being seen today for ongoing prenatal care.  She is currently monitored for the following issues for this high-risk pregnancy and has Bipolar I disorder (HCC); Hypothyroidism; Arthralgia; Chronic fatigue; De Quervain's tenosynovitis, left; Mass of left side of neck; Gastroesophageal reflux disease; Supervision of high risk pregnancy, antepartum; Anxiety and depression; Uncomplicated asthma; and History of gestational hypertension on their problem list.  Patient reports wheezing using albuterol  daily.  Contractions: Irritability. Vag. Bleeding: None.  Movement: Present. Denies leaking of fluid.   The following portions of the patient's history were reviewed and updated as appropriate: allergies, current medications, past family history, past medical history, past social history, past surgical history and problem list.   Objective:    Vitals:   10/02/24 1639  BP: 118/82  Pulse: 98  Weight: 150 lb 9.6 oz (68.3 kg)    Fetal Status:  Fetal Heart Rate (bpm): 143   Movement: Present    General: Alert, oriented and cooperative. Patient is in no acute distress.  Skin: Skin is warm and dry. No rash noted.   Cardiovascular: Normal heart rate noted  Respiratory: Normal respiratory effort, no problems with respiration noted  Abdomen: Soft, gravid, appropriate for gestational age.  Pain/Pressure: Present     Pelvic: Cervical exam deferred        Extremities: Normal range of motion.  Edema: Trace (hands)  Mental Status: Normal mood and affect. Normal behavior. Normal judgment and thought content.   Assessment and Plan:  Pregnancy: G5P1031 at [redacted]w[redacted]d 1. Mild asthma without complication, unspecified whether persistent (Primary)  - albuterol  (PROVENTIL ) (2.5 MG/3ML) 0.083% nebulizer solution; Take 3 mLs (2.5 mg total) by nebulization every 6 (six) hours as needed for wheezing or shortness of breath.  Dispense: 75  mL; Refill: 0 - Budesonide  90 MCG/ACT inhaler; Inhale 1 puff into the lungs 2 (two) times daily.  Dispense: 1 each; Refill: 2  2. [redacted] weeks gestation of pregnancy   3. Supervision of high risk pregnancy, antepartum Has cardiology f/u  Preterm labor symptoms and general obstetric precautions including but not limited to vaginal bleeding, contractions, leaking of fluid and fetal movement were reviewed in detail with the patient. Please refer to After Visit Summary for other counseling recommendations.   Return in about 2 weeks (around 10/16/2024).  Future Appointments  Date Time Provider Department Center  10/07/2024  2:00 PM [facility] PROVIDER 1 [facility]  10/07/2024  2:15 PM [facility] US2 [facility]  10/13/2024  3:15 PM Nicholaus Burnard HERO, MD [facility]  10/20/2024  2:45 PM Elnor Channing FALCON, PT [facility] None  10/23/2024  1:30 PM [facility] [address]  10/27/2024  4:15 PM Ilean Norleen GAILS, MD [facility]  11/03/2024  2:45 PM Elnor Channing FALCON, PT [facility] None  11/03/2024  3:55 PM Eldonna Suzen Octave, MD [facility]  11/11/2024  4:15 PM Anyanwu, Gloris LABOR, MD [facility]  11/17/2024  2:45 PM Elnor Channing FALCON, PT [facility] None  11/17/2024  4:15 PM Cleatus Moccasin, MD [facility]  11/24/2024 10:55 AM Eldonna Suzen Octave, MD [facility]  12/17/2024  2:40 PM Tobb, Dub, DO [facility]    Lynwood Solomons, MD

## 2024-10-02 NOTE — Progress Notes (Signed)
 LC to room per RN request, pt had questions about pump options and ordering pump. Provided guidance on pump option and explained how to order via insurance. Raciel Caffrey IBCLC and [employer] IBCLC

## 2024-10-07 ENCOUNTER — Ambulatory Visit

## 2024-10-07 ENCOUNTER — Ambulatory Visit: Attending: Maternal & Fetal Medicine | Admitting: Maternal & Fetal Medicine

## 2024-10-07 ENCOUNTER — Other Ambulatory Visit: Payer: Self-pay | Admitting: *Deleted

## 2024-10-07 ENCOUNTER — Other Ambulatory Visit: Payer: Self-pay | Admitting: Maternal & Fetal Medicine

## 2024-10-07 VITALS — BP 132/74

## 2024-10-07 DIAGNOSIS — O133 Gestational [pregnancy-induced] hypertension without significant proteinuria, third trimester: Secondary | ICD-10-CM

## 2024-10-07 DIAGNOSIS — E039 Hypothyroidism, unspecified: Secondary | ICD-10-CM

## 2024-10-07 DIAGNOSIS — O365930 Maternal care for other known or suspected poor fetal growth, third trimester, not applicable or unspecified: Secondary | ICD-10-CM

## 2024-10-07 DIAGNOSIS — Z362 Encounter for other antenatal screening follow-up: Secondary | ICD-10-CM | POA: Insufficient documentation

## 2024-10-07 DIAGNOSIS — E032 Hypothyroidism due to medicaments and other exogenous substances: Secondary | ICD-10-CM | POA: Diagnosis not present

## 2024-10-07 DIAGNOSIS — Z3A33 33 weeks gestation of pregnancy: Secondary | ICD-10-CM

## 2024-10-07 DIAGNOSIS — O0990 Supervision of high risk pregnancy, unspecified, unspecified trimester: Secondary | ICD-10-CM | POA: Diagnosis not present

## 2024-10-07 DIAGNOSIS — O99283 Endocrine, nutritional and metabolic diseases complicating pregnancy, third trimester: Secondary | ICD-10-CM | POA: Diagnosis not present

## 2024-10-07 DIAGNOSIS — O09293 Supervision of pregnancy with other poor reproductive or obstetric history, third trimester: Secondary | ICD-10-CM

## 2024-10-07 DIAGNOSIS — Z8759 Personal history of other complications of pregnancy, childbirth and the puerperium: Secondary | ICD-10-CM

## 2024-10-07 NOTE — Progress Notes (Signed)
 Patient information  Patient Name: Whitney Gonzalez  Patient MRN:   [id number]  Referring practice: MFM Referring Provider: [facility])  Problem List   Patient Active Problem List   Diagnosis Date Noted   Anxiety and depression 05/19/2024   Uncomplicated asthma 05/19/2024   History of gestational hypertension 05/19/2024   Supervision of high risk pregnancy, antepartum 05/15/2024   Gastroesophageal reflux disease 04/11/2022   Mass of left side of neck 08/15/2021   De Quervain's tenosynovitis, left 10/27/2019   Arthralgia 08/06/2019   Chronic fatigue 08/06/2019   Hypothyroidism 05/12/2019   Bipolar I disorder (HCC) 05/28/2012    Maternal Fetal medicine Consult  Whitney Gonzalez is a 33 y.o. G5P1031 at [redacted]w[redacted]d here for ultrasound and consultation. Whitney Gonzalez is doing well today with no acute concerns. Today we focused on the following:   There is very borderline fetal growth restriction today with estimated fetal weight at the 9th percentile overall and the abdominal circumference within normal range.  Umbilical artery Dopplers are normal.  This likely represents constitutional growth but close follow-up should be arranged.  The patient will return in 1 week for nonstress test and then in 2 weeks for BPP and Dopplers.  Growth will be assessed again in 3 weeks.  If there is a decline in the fetal growth percentiles this may represent true pathologic fetal growth restriction and closer surveillance will be done.  However, if the fetal growth maintains then this likely should not result in significant alterations in the pregnancy plan of care and delivery could occur around the EDD.  The patient reports that her thyroid  is well-controlled on Synthroid.  She will continue to follow-up with her OB provider. The patient had time to ask questions that were answered to her satisfaction.  She verbalized understanding and agrees to proceed with the  plan below.  Recommendations - NST next week. - Continue UA dopplers every 1-2 weeks (next in 2 weeks)   - Serial growth US  every 3 weeks until delivery.  Review of Systems: A review of systems was performed and was negative except per HPI   Vitals and Physical Exam    10/07/2024    1:47 PM 10/02/2024    4:39 PM 09/26/2024    3:17 PM  Vitals with BMI  Height   5' 0  Weight  150 lbs 10 oz 150 lbs 6 oz  BMI  29.41 29.37  Systolic 132 118 853  Diastolic 74 82 94  Pulse  98 95    Sitting comfortably on the sonogram table Nonlabored breathing Normal rate and rhythm Abdomen is nontender  Past pregnancies OB History  Gravida Para Term Preterm AB Living  5 1 1  0 3 1  SAB IAB Ectopic Multiple Live Births  3 0 0 0 1    # Outcome Date GA Lbr Len/2nd Weight Sex Type Anes PTL Lv  5 Current           4 Term 05/14/19 [redacted]w[redacted]d 32:28 / 00:30 6 lb 12 oz (3.062 kg) M Vag-Spont EPI  LIV  3 SAB           2 SAB           1 SAB              I spent 30 minutes reviewing the patients chart, including labs and images as well as counseling the patient about her medical conditions. Greater than 50% of  the time was spent in direct face-to-face patient counseling.  [name]  MFM, [facility]   10/07/2024  2:56 PM

## 2024-10-13 ENCOUNTER — Other Ambulatory Visit: Payer: Self-pay

## 2024-10-13 ENCOUNTER — Ambulatory Visit: Attending: Obstetrics and Gynecology | Admitting: *Deleted

## 2024-10-13 ENCOUNTER — Ambulatory Visit (INDEPENDENT_AMBULATORY_CARE_PROVIDER_SITE_OTHER): Admitting: Obstetrics and Gynecology

## 2024-10-13 ENCOUNTER — Encounter: Payer: Self-pay | Admitting: Obstetrics and Gynecology

## 2024-10-13 VITALS — BP 118/81 | HR 118 | Wt 156.0 lb

## 2024-10-13 VITALS — BP 118/70 | HR 88

## 2024-10-13 DIAGNOSIS — F32A Depression, unspecified: Secondary | ICD-10-CM

## 2024-10-13 DIAGNOSIS — O0993 Supervision of high risk pregnancy, unspecified, third trimester: Secondary | ICD-10-CM

## 2024-10-13 DIAGNOSIS — J45909 Unspecified asthma, uncomplicated: Secondary | ICD-10-CM

## 2024-10-13 DIAGNOSIS — E039 Hypothyroidism, unspecified: Secondary | ICD-10-CM

## 2024-10-13 DIAGNOSIS — O365930 Maternal care for other known or suspected poor fetal growth, third trimester, not applicable or unspecified: Secondary | ICD-10-CM

## 2024-10-13 DIAGNOSIS — Z8759 Personal history of other complications of pregnancy, childbirth and the puerperium: Secondary | ICD-10-CM | POA: Diagnosis not present

## 2024-10-13 DIAGNOSIS — Z3A34 34 weeks gestation of pregnancy: Secondary | ICD-10-CM | POA: Insufficient documentation

## 2024-10-13 DIAGNOSIS — F419 Anxiety disorder, unspecified: Secondary | ICD-10-CM | POA: Diagnosis not present

## 2024-10-13 DIAGNOSIS — O0990 Supervision of high risk pregnancy, unspecified, unspecified trimester: Secondary | ICD-10-CM

## 2024-10-13 DIAGNOSIS — R030 Elevated blood-pressure reading, without diagnosis of hypertension: Secondary | ICD-10-CM

## 2024-10-13 NOTE — Procedures (Signed)
 Whitney Gonzalez 1991/06/28 [redacted]w[redacted]d  Fetus A Non-Stress Test Interpretation for 10/13/24  Indication: IUGR  Fetal Heart Rate A Mode: External Baseline Rate (A): 130 bpm Variability: Moderate Accelerations: 15 x 15 Decelerations: None Multiple birth?: No  Uterine Activity Mode: Toco Contraction Frequency (min): None Resting Tone Palpated: Relaxed  Interpretation (Fetal Testing) Nonstress Test Interpretation: Reactive Comments: Tracing reviewed by Dr. William

## 2024-10-13 NOTE — Progress Notes (Signed)
 PRENATAL VISIT NOTE  Subjective:  Whitney Gonzalez is a 33 y.o. [phone] at [redacted]w[redacted]d being seen today for ongoing prenatal care.  She is currently monitored for the following issues for this high-risk pregnancy and has Bipolar I disorder (HCC); Hypothyroidism; Arthralgia; Chronic fatigue; De Quervain's tenosynovitis, left; Mass of left side of neck; Gastroesophageal reflux disease; Supervision of high risk pregnancy, antepartum; Anxiety and depression; Uncomplicated asthma; History of gestational hypertension; and Poor fetal growth affecting management of mother in third trimester on their problem list.  Patient reports feeling much better on synthroid and steroid inhaler, has not had to nebulize since started using the steroid inhaler. Has noted a couple of headaches that are intermittent and lessen. Only on right, feels like pressure. A couple of floaters when headache. Has similar pressure with her migraines however with migraines are all over and this is localized.  Contractions: Irritability. Vag. Bleeding: None.  Movement: Present. Denies leaking of fluid.   The following portions of the patient's history were reviewed and updated as appropriate: allergies, current medications, past family history, past medical history, past social history, past surgical history and problem list.   Objective:   Vitals:   10/13/24 1505  BP: 118/81  Pulse: (!) 118  Weight: 156 lb (70.8 kg)    Fetal Status:  Fetal Heart Rate (bpm): 154   Movement: Present    General: Alert, oriented and cooperative. Patient is in no acute distress.  Skin: Skin is warm and dry. No rash noted.   Cardiovascular: Normal heart rate noted  Respiratory: Normal respiratory effort, no problems with respiration noted  Abdomen: Soft, gravid, appropriate for gestational age.  Pain/Pressure: Present (both pain and pressure lower abd)     Pelvic: Cervical exam deferred        Extremities: Normal range of motion.  Edema: Trace   Mental Status: Normal mood and affect. Normal behavior. Normal judgment and thought content.      05/19/2024    4:02 PM 12/26/2022   10:08 AM 04/11/2022    2:34 PM  Depression screen PHQ 2/9  Decreased Interest 0 0 0  Down, Depressed, Hopeless 0 0 0  PHQ - 2 Score 0 0 0  Altered sleeping 1 1 0  Tired, decreased energy 1 1 3   Change in appetite 1 0 0  Feeling bad or failure about yourself  0 0 0  Trouble concentrating 0 1 0  Moving slowly or fidgety/restless 0 0 0  Suicidal thoughts 0 0 0  PHQ-9 Score 3  3  3    Difficult doing work/chores Not difficult at all Not difficult at all      Data saved with a previous flowsheet row definition        05/19/2024    4:03 PM 12/26/2022   10:09 AM 04/11/2022    2:35 PM 09/07/2021    1:21 PM  GAD 7 : Generalized Anxiety Score  Nervous, Anxious, on Edge 0 1 1 1   Control/stop worrying 0 0 0 0  Worry too much - different things 0 1 0 1  Trouble relaxing 0 1 0 0  Restless 0 0 0 0  Easily annoyed or irritable 1 0 1 0  Afraid - awful might happen 0 1 0 0  Total GAD 7 Score 1 4 2 2   Anxiety Difficulty Not difficult at all Not difficult at all      Assessment and Plan:  Pregnancy: G5P1031 at [redacted]w[redacted]d  1. Supervision of high risk pregnancy,  antepartum (Primary)  2. Anxiety and depression  3. History of gestational hypertension  4. Poor fetal growth affecting management of mother in third trimester, single or unspecified fetus Last growth 9th%tile Weekly NST Reviewed potential need for early delivery given IUGR, she is agreeable to plan  5. Mild asthma without complication, unspecified whether persistent Doing better  6. [redacted] weeks gestation of pregnancy  7. Hypothyroidism, unspecified type - TSH + free T4  8. Elevated blood pressure reading without diagnosis of hypertension One elevated reading in MFM office None since Headaches Precautions given, reasons to present to MAU CBC/CMP/UPRCr today    Preterm labor symptoms and  general obstetric precautions including but not limited to vaginal bleeding, contractions, leaking of fluid and fetal movement were reviewed in detail with the patient. Please refer to After Visit Summary for other counseling recommendations.   Return in about 2 weeks (around 10/27/2024) for 36 week swabs, high OB.  Future Appointments  Date Time Provider Department Center  10/20/2024  2:45 PM Elnor Channing FALCON, PT [facility] None  10/23/2024  1:30 PM [facility] [id number]  10/27/2024  4:15 PM Ilean Norleen GAILS, MD [facility]  11/03/2024  2:45 PM Elnor Channing FALCON, PT [facility] None  11/03/2024  3:55 PM Eldonna Suzen Octave, MD [facility]  11/11/2024  4:15 PM Herchel, Gloris LABOR, MD [facility]  11/17/2024  2:45 PM Elnor Channing FALCON, PT [facility] None  11/17/2024  4:15 PM Cleatus Moccasin, MD [facility]  11/24/2024 10:55 AM Eldonna Suzen Octave, MD [facility]  12/17/2024  2:40 PM Tobb, Dub, DO [facility]    Burnard CHRISTELLA Moats, MD

## 2024-10-14 ENCOUNTER — Ambulatory Visit: Payer: Self-pay | Admitting: Obstetrics and Gynecology

## 2024-10-14 LAB — TSH+FREE T4
Free T4: 0.81 ng/dL — ABNORMAL LOW (ref 0.82–1.77)
TSH: 0.835 u[IU]/mL (ref 0.450–4.500)

## 2024-10-14 LAB — COMPREHENSIVE METABOLIC PANEL WITH GFR
ALT: 10 IU/L (ref 0–32)
AST: 15 IU/L (ref 0–40)
Albumin: 3.4 g/dL — ABNORMAL LOW (ref 3.9–4.9)
Alkaline Phosphatase: 98 IU/L (ref 41–116)
BUN/Creatinine Ratio: 14 (ref 9–23)
BUN: 7 mg/dL (ref 6–20)
Bilirubin Total: <0.2 mg/dL (ref 0.0–1.2)
CO2: 19 mmol/L — ABNORMAL LOW (ref 20–29)
Calcium: 8.5 mg/dL — ABNORMAL LOW (ref 8.7–10.2)
Chloride: 105 mmol/L (ref 96–106)
Creatinine, Ser: 0.51 mg/dL — ABNORMAL LOW (ref 0.57–1.00)
Globulin, Total: 2.1 g/dL (ref 1.5–4.5)
Glucose: 97 mg/dL (ref 70–99)
Potassium: 4.1 mmol/L (ref 3.5–5.2)
Sodium: 139 mmol/L (ref 134–144)
Total Protein: 5.5 g/dL — ABNORMAL LOW (ref 6.0–8.5)
eGFR: 127 mL/min/1.73 (ref >59)

## 2024-10-14 LAB — CBC
Hematocrit: 36.2 % (ref 34.0–46.6)
Hemoglobin: 12.4 g/dL (ref 11.1–15.9)
MCH: 31.8 pg (ref 26.6–33.0)
MCHC: 34.3 g/dL (ref 31.5–35.7)
MCV: 93 fL (ref 79–97)
Platelets: 185 x10E3/uL (ref 150–450)
RBC: 3.9 x10E6/uL (ref 3.77–5.28)
RDW: 13.1 % (ref 11.7–15.4)
WBC: 7.3 x10E3/uL (ref 3.4–10.8)

## 2024-10-16 ENCOUNTER — Inpatient Hospital Stay (HOSPITAL_COMMUNITY)
Admission: AD | Admit: 2024-10-16 | Discharge: 2024-10-16 | Disposition: A | Discharge status: Home / Self Care | Attending: Obstetrics and Gynecology | Admitting: Obstetrics and Gynecology

## 2024-10-16 ENCOUNTER — Encounter (HOSPITAL_COMMUNITY): Payer: Self-pay | Admitting: Obstetrics and Gynecology

## 2024-10-16 DIAGNOSIS — N760 Acute vaginitis: Secondary | ICD-10-CM

## 2024-10-16 DIAGNOSIS — O368130 Decreased fetal movements, third trimester, not applicable or unspecified: Secondary | ICD-10-CM | POA: Insufficient documentation

## 2024-10-16 DIAGNOSIS — B9689 Other specified bacterial agents as the cause of diseases classified elsewhere: Secondary | ICD-10-CM | POA: Diagnosis not present

## 2024-10-16 DIAGNOSIS — O23593 Infection of other part of genital tract in pregnancy, third trimester: Secondary | ICD-10-CM | POA: Diagnosis not present

## 2024-10-16 DIAGNOSIS — Z882 Allergy status to sulfonamides status: Secondary | ICD-10-CM | POA: Insufficient documentation

## 2024-10-16 DIAGNOSIS — Z880 Allergy status to penicillin: Secondary | ICD-10-CM | POA: Insufficient documentation

## 2024-10-16 DIAGNOSIS — Z881 Allergy status to other antibiotic agents status: Secondary | ICD-10-CM | POA: Insufficient documentation

## 2024-10-16 DIAGNOSIS — O0990 Supervision of high risk pregnancy, unspecified, unspecified trimester: Secondary | ICD-10-CM

## 2024-10-16 DIAGNOSIS — O4703 False labor before 37 completed weeks of gestation, third trimester: Secondary | ICD-10-CM | POA: Insufficient documentation

## 2024-10-16 DIAGNOSIS — Z3A34 34 weeks gestation of pregnancy: Secondary | ICD-10-CM

## 2024-10-16 DIAGNOSIS — O479 False labor, unspecified: Secondary | ICD-10-CM | POA: Diagnosis not present

## 2024-10-16 LAB — WET PREP, GENITAL
Sperm: NONE SEEN
Trich, Wet Prep: NONE SEEN
WBC, Wet Prep HPF POC: >=10 — AB (ref <10)
Yeast Wet Prep HPF POC: NONE SEEN

## 2024-10-16 LAB — URINALYSIS, ROUTINE W REFLEX MICROSCOPIC
Bilirubin Urine: NEGATIVE
Glucose, UA: NEGATIVE mg/dL
Hgb urine dipstick: NEGATIVE
Ketones, ur: NEGATIVE mg/dL
Leukocytes,Ua: NEGATIVE
Nitrite: NEGATIVE
Protein, ur: NEGATIVE mg/dL
Specific Gravity, Urine: 1.004 — ABNORMAL LOW (ref 1.005–1.030)
pH: 7 (ref 5.0–8.0)

## 2024-10-16 MED ORDER — CLINDAMYCIN HCL 300 MG PO CAPS: 300.0000 mg | ORAL_CAPSULE | Freq: Two times a day (BID) | ORAL | 0 refills | Status: AC

## 2024-10-16 MED ORDER — CLINDAMYCIN HCL 300 MG PO CAPS: 300.0000 mg | ORAL_CAPSULE | Freq: Two times a day (BID) | ORAL | 0 refills | Status: DC

## 2024-10-16 NOTE — MAU Note (Signed)
 Whitney Gonzalez is a 33 y.o. at [redacted]w[redacted]d here in MAU reporting ctxs since 1530. Denies LOF or VB. Reports decreased FM since 1530. Pt is being followed for fetal growth restriction  LMP: na Onset of complaint: 1530 Pain score: 5 Vitals:   10/16/24 2016  Pulse: 77  Resp: 17  Temp: 98.3 F (36.8 C)     FHT:  150 Lab orders placed from triage: u/a
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
## 2024-10-17 ENCOUNTER — Ambulatory Visit: Attending: Maternal & Fetal Medicine

## 2024-10-17 ENCOUNTER — Ambulatory Visit (HOSPITAL_BASED_OUTPATIENT_CLINIC_OR_DEPARTMENT_OTHER): Admitting: Maternal & Fetal Medicine

## 2024-10-17 VITALS — BP 120/76 | HR 93

## 2024-10-17 DIAGNOSIS — O133 Gestational [pregnancy-induced] hypertension without significant proteinuria, third trimester: Secondary | ICD-10-CM | POA: Diagnosis not present

## 2024-10-17 DIAGNOSIS — E039 Hypothyroidism, unspecified: Secondary | ICD-10-CM

## 2024-10-17 DIAGNOSIS — O99283 Endocrine, nutritional and metabolic diseases complicating pregnancy, third trimester: Secondary | ICD-10-CM | POA: Insufficient documentation

## 2024-10-17 DIAGNOSIS — O0990 Supervision of high risk pregnancy, unspecified, unspecified trimester: Secondary | ICD-10-CM | POA: Diagnosis not present

## 2024-10-17 DIAGNOSIS — O365930 Maternal care for other known or suspected poor fetal growth, third trimester, not applicable or unspecified: Secondary | ICD-10-CM | POA: Diagnosis present

## 2024-10-17 DIAGNOSIS — Z3A34 34 weeks gestation of pregnancy: Secondary | ICD-10-CM

## 2024-10-17 DIAGNOSIS — Z8759 Personal history of other complications of pregnancy, childbirth and the puerperium: Secondary | ICD-10-CM | POA: Diagnosis not present

## 2024-10-17 LAB — GC/CHLAMYDIA PROBE AMP (~~LOC~~) NOT AT ARMC
Chlamydia: NEGATIVE
Comment: NEGATIVE
Comment: NORMAL
Neisseria Gonorrhea: NEGATIVE

## 2024-10-17 NOTE — Progress Notes (Signed)
   Patient information  Patient Name: Whitney Gonzalez  Patient MRN:   [id number]  Referring practice: MFM Referring Provider: [facility])  Problem List   Patient Active Problem List   Diagnosis Date Noted   Poor fetal growth affecting management of mother in third trimester 10/07/2024   Anxiety and depression 05/19/2024   Uncomplicated asthma 05/19/2024   History of gestational hypertension 05/19/2024   Supervision of high risk pregnancy, antepartum 05/15/2024   Gastroesophageal reflux disease 04/11/2022   Mass of left side of neck 08/15/2021   De Quervain's tenosynovitis, left 10/27/2019   Arthralgia 08/06/2019   Chronic fatigue 08/06/2019   Hypothyroidism 05/12/2019   Bipolar I disorder (HCC) 05/28/2012   Maternal Fetal medicine Consult  Whitney Gonzalez is a 33 y.o. G5P1031 at [redacted]w[redacted]d here for ultrasound and consultation. Whitney Gonzalez is doing well today with no acute concerns. Today we focused on the following:   FGR: Patient is here for 2 umbilical artery Dopplers and BPP.  10 days ago the growth is at the 9th percentile overall.  I discussed this likely represents constitutional growth especially since the Dopplers have been normal.  Today the umbilical artery Dopplers are normal and BPP is 8 out of 8.  She will return in approximately 1 week for BPP and Dopplers and then in 2 weeks we will add growth.  The patient is accompanied by her mother who is a Nurse, Learning Disability.  We discussed importance of fetal movement assessment at home and to go to the MAU with any concerns.  She was seen recently for contractions and is dilated to 1 cm today she feels well and has no concerns.  The patient had time to ask questions that were answered to her satisfaction.  She verbalized understanding and agrees to proceed with the plan below.  Recommendations - She will return in approximately 1 week for BPP and Dopplers and then in 2 weeks we will add  growth.  Review of Systems: A review of systems was performed and was negative except per HPI   Vitals and Physical Exam    10/17/2024    1:48 PM 10/16/2024    9:41 PM 10/16/2024    8:40 PM  Vitals with BMI  Systolic 120 126 871  Diastolic 76 82 83  Pulse 93 80 92    Sitting comfortably on the sonogram table Nonlabored breathing Normal rate and rhythm Abdomen is nontender  Past pregnancies OB History  Gravida Para Term Preterm AB Living  5 1 1  0 3 1  SAB IAB Ectopic Multiple Live Births  3 0 0 0 1    # Outcome Date GA Lbr Len/2nd Weight Sex Type Anes PTL Lv  5 Current           4 Term 05/14/19 [redacted]w[redacted]d 32:28 / 00:30 6 lb 12 oz (3.062 kg) M Vag-Spont EPI  LIV  3 SAB           2 SAB           1 SAB              I spent 30 minutes reviewing the patients chart, including labs and images as well as counseling the patient about her medical conditions. Greater than 50% of the time was spent in direct face-to-face patient counseling.  [name]  MFM, [place]   10/17/2024  1:53 PM

## 2024-10-19 ENCOUNTER — Encounter (HOSPITAL_COMMUNITY): Payer: Self-pay | Admitting: Obstetrics & Gynecology

## 2024-10-19 ENCOUNTER — Inpatient Hospital Stay (HOSPITAL_COMMUNITY)
Admission: AD | Admit: 2024-10-19 | Discharge: 2024-10-19 | Disposition: A | Discharge status: Home / Self Care | Payer: Self-pay | Attending: Obstetrics & Gynecology | Admitting: Obstetrics & Gynecology

## 2024-10-19 DIAGNOSIS — O26893 Other specified pregnancy related conditions, third trimester: Secondary | ICD-10-CM | POA: Diagnosis not present

## 2024-10-19 DIAGNOSIS — R030 Elevated blood-pressure reading, without diagnosis of hypertension: Secondary | ICD-10-CM | POA: Diagnosis present

## 2024-10-19 DIAGNOSIS — Z3A35 35 weeks gestation of pregnancy: Secondary | ICD-10-CM | POA: Diagnosis not present

## 2024-10-19 DIAGNOSIS — O4703 False labor before 37 completed weeks of gestation, third trimester: Secondary | ICD-10-CM | POA: Diagnosis present

## 2024-10-19 DIAGNOSIS — R519 Headache, unspecified: Secondary | ICD-10-CM | POA: Insufficient documentation

## 2024-10-19 LAB — COMPREHENSIVE METABOLIC PANEL WITH GFR
ALT: 12 U/L (ref 0–44)
AST: 19 U/L (ref 15–41)
Albumin: 2.5 g/dL — ABNORMAL LOW (ref 3.5–5.0)
Alkaline Phosphatase: 89 U/L (ref 38–126)
Anion gap: 8 (ref 5–15)
BUN: 5 mg/dL — ABNORMAL LOW (ref 6–20)
CO2: 20 mmol/L — ABNORMAL LOW (ref 22–32)
Calcium: 8.5 mg/dL — ABNORMAL LOW (ref 8.9–10.3)
Chloride: 108 mmol/L (ref 98–111)
Creatinine, Ser: 0.46 mg/dL (ref 0.44–1.00)
GFR, Estimated: >60 mL/min (ref >60)
Glucose, Bld: 94 mg/dL (ref 70–99)
Potassium: 3.9 mmol/L (ref 3.5–5.1)
Sodium: 136 mmol/L (ref 135–145)
Total Bilirubin: 0.5 mg/dL (ref 0.0–1.2)
Total Protein: 5.7 g/dL — ABNORMAL LOW (ref 6.5–8.1)

## 2024-10-19 LAB — CBC
HCT: 35.6 % — ABNORMAL LOW (ref 36.0–46.0)
Hemoglobin: 12.5 g/dL (ref 12.0–15.0)
MCH: 31.5 pg (ref 26.0–34.0)
MCHC: 35.1 g/dL (ref 30.0–36.0)
MCV: 89.7 fL (ref 80.0–100.0)
Platelets: 202 K/uL (ref 150–400)
RBC: 3.97 MIL/uL (ref 3.87–5.11)
RDW: 13.1 % (ref 11.5–15.5)
WBC: 8.3 K/uL (ref 4.0–10.5)
nRBC: 0 % (ref 0.0–0.2)

## 2024-10-19 LAB — PROTEIN / CREATININE RATIO, URINE
Creatinine, Urine: 72 mg/dL
Protein Creatinine Ratio: 0.1 mg/mg{creat} (ref 0.00–0.15)
Total Protein, Urine: 7 mg/dL

## 2024-10-19 MED ORDER — ACETAMINOPHEN-CAFFEINE 500-65 MG PO TABS
2.0000 | ORAL_TABLET | Freq: Once | ORAL | Status: DC
Start: 2024-10-19 — End: 2024-10-19
  Filled 2024-10-19: qty 2

## 2024-10-19 NOTE — MAU Note (Signed)
 Checked home b/p machine against one in MAU.  120/85 home 121/81 MAU. Reminded pt to keep the machine level with the cuff.
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
## 2024-10-19 NOTE — Discharge Instructions (Addendum)
 Your pre-eclampsia labs are within normal range today. You do not have pre-eclampsia or gestational hypertension.  Safe Medications in Pregnancy   Acne: Benzoyl Peroxide Salicylic Acid  Backache/Headache: Tylenol : 2 regular strength every 4 hours OR              2 Extra strength every 6 hours  Colds/Coughs/Allergies: Benadryl  (alcohol free) 25 mg every 6 hours as needed Breath right strips Claritin Cepacol throat lozenges Chloraseptic throat spray Cold-Eeze- up to three times per day Cough drops, alcohol free Flonase  (by prescription only) Guaifenesin Mucinex Robitussin DM (plain only, alcohol free) Saline nasal spray/drops Sudafed (pseudoephedrine) & Actifed ** use only after [redacted] weeks gestation and if you do not have high blood pressure Tylenol  Vicks Vaporub Zinc lozenges Zyrtec   Constipation: Colace Ducolax suppositories Fleet enema Glycerin  suppositories Metamucil Milk of magnesia Miralax  Senokot Smooth move tea  Diarrhea: Kaopectate Imodium A-D  *NO pepto Bismol  Hemorrhoids: Anusol  Anusol  HC Preparation H Tucks  Indigestion: Tums Maalox Mylanta Zantac  Pepcid   Insomnia: Benadryl  (alcohol free) 25mg  every 6 hours as needed Tylenol  PM Unisom, no Gelcaps  Leg Cramps: Tums MagGel  Nausea/Vomiting:  Bonine Dramamine Emetrol Ginger extract Sea bands Meclizine  Nausea medication to take during pregnancy:  Unisom (doxylamine succinate 25 mg tablets) Take one tablet daily at bedtime. If symptoms are not adequately controlled, the dose can be increased to a maximum recommended dose of two tablets daily (1/2 tablet in the morning, 1/2 tablet mid-afternoon and one at bedtime). Vitamin B6 100mg  tablets. Take one tablet twice a day (up to 200 mg per day).  Skin Rashes: Aveeno products Benadryl  cream or 25mg  every 6 hours as needed Calamine Lotion 1% cortisone cream  Yeast infection: Gyne-lotrimin 7 Monistat 7   **If taking multiple  medications, please check labels to avoid duplicating the same active ingredients **take medication as directed on the label ** Do not exceed 4000 mg of tylenol  in 24 hours **Do not take medications that contain aspirin  or ibuprofen 

## 2024-10-19 NOTE — MAU Note (Addendum)
 Whitney Gonzalez is a 33 y.o. at [redacted]w[redacted]d here in MAU reporting: has had a mild headache off and on for a week  ( not taking anything. )Stated her hands are more swollen today and she has taken her b/p several times and had one very high at 180/90 then next one was 122/82 115/83 and few more with diastaltic in 90's. Pt does have history of PIH with previous pregnancy. Reports some ctx (was seen on Friday for preterm ctx). Also stated baby has been moving but not as often as usual.   LMP:  Onset of complaint: today Pain score: 1-2 Vitals:   10/19/24 1405  BP: 122/84  Pulse: 94  Resp: 18  Temp: 99.5 F (37.5 C)     FHT: 130  Lab orders placed from triage: Urine PCR.

## 2024-10-20 ENCOUNTER — Ambulatory Visit: Admitting: Physical Therapy

## 2024-10-20 ENCOUNTER — Telehealth: Payer: Self-pay | Admitting: Family Medicine

## 2024-10-20 NOTE — Telephone Encounter (Signed)
 Attempted to return pt call.  Left voicemail with office call back number.    [place], RN

## 2024-10-20 NOTE — Telephone Encounter (Signed)
 Was seen the ED on Thursday due to cramping,contractions,abdominal back pain and pressure. She is being monitored for HTN and she just woke up snd it was 125/86.

## 2024-10-23 ENCOUNTER — Ambulatory Visit (INDEPENDENT_AMBULATORY_CARE_PROVIDER_SITE_OTHER)

## 2024-10-23 DIAGNOSIS — R0602 Shortness of breath: Secondary | ICD-10-CM | POA: Diagnosis not present

## 2024-10-23 LAB — ECHOCARDIOGRAM COMPLETE
Area-P 1/2: 4.57 cm2
S' Lateral: 2.6 cm

## 2024-10-24 ENCOUNTER — Ambulatory Visit (HOSPITAL_BASED_OUTPATIENT_CLINIC_OR_DEPARTMENT_OTHER): Admitting: Obstetrics and Gynecology

## 2024-10-24 ENCOUNTER — Ambulatory Visit: Attending: Maternal & Fetal Medicine

## 2024-10-24 VITALS — BP 115/72 | HR 85

## 2024-10-24 DIAGNOSIS — Z3A35 35 weeks gestation of pregnancy: Secondary | ICD-10-CM | POA: Insufficient documentation

## 2024-10-24 DIAGNOSIS — E039 Hypothyroidism, unspecified: Secondary | ICD-10-CM | POA: Diagnosis present

## 2024-10-24 DIAGNOSIS — O365930 Maternal care for other known or suspected poor fetal growth, third trimester, not applicable or unspecified: Secondary | ICD-10-CM | POA: Insufficient documentation

## 2024-10-24 DIAGNOSIS — O0990 Supervision of high risk pregnancy, unspecified, unspecified trimester: Secondary | ICD-10-CM

## 2024-10-24 DIAGNOSIS — O99283 Endocrine, nutritional and metabolic diseases complicating pregnancy, third trimester: Secondary | ICD-10-CM | POA: Diagnosis present

## 2024-10-24 DIAGNOSIS — O133 Gestational [pregnancy-induced] hypertension without significant proteinuria, third trimester: Secondary | ICD-10-CM | POA: Insufficient documentation

## 2024-10-24 NOTE — Progress Notes (Signed)
 After review, MFM consult with provider is not indicated for today  Arna Ranks, MD 10/24/2024 4:47 PM  [facility]

## 2024-10-27 ENCOUNTER — Other Ambulatory Visit: Payer: Self-pay

## 2024-10-27 ENCOUNTER — Ambulatory Visit (INDEPENDENT_AMBULATORY_CARE_PROVIDER_SITE_OTHER): Admitting: Family Medicine

## 2024-10-27 ENCOUNTER — Inpatient Hospital Stay (HOSPITAL_COMMUNITY)
Admission: AD | Admit: 2024-10-27 | Discharge: 2024-10-27 | Disposition: A | Discharge status: Home / Self Care | Payer: Self-pay | Attending: Obstetrics and Gynecology | Admitting: Obstetrics and Gynecology

## 2024-10-27 ENCOUNTER — Encounter (HOSPITAL_COMMUNITY): Payer: Self-pay | Admitting: Obstetrics and Gynecology

## 2024-10-27 ENCOUNTER — Other Ambulatory Visit (HOSPITAL_COMMUNITY)
Admission: RE | Admit: 2024-10-27 | Discharge: 2024-10-27 | Disposition: A | Discharge status: Home / Self Care | Source: Ambulatory Visit | Attending: Family Medicine | Admitting: Family Medicine

## 2024-10-27 VITALS — BP 132/87 | HR 97 | Wt 159.6 lb

## 2024-10-27 DIAGNOSIS — O0993 Supervision of high risk pregnancy, unspecified, third trimester: Secondary | ICD-10-CM

## 2024-10-27 DIAGNOSIS — J45909 Unspecified asthma, uncomplicated: Secondary | ICD-10-CM

## 2024-10-27 DIAGNOSIS — O0990 Supervision of high risk pregnancy, unspecified, unspecified trimester: Secondary | ICD-10-CM | POA: Diagnosis present

## 2024-10-27 DIAGNOSIS — Z8759 Personal history of other complications of pregnancy, childbirth and the puerperium: Secondary | ICD-10-CM

## 2024-10-27 DIAGNOSIS — N898 Other specified noninflammatory disorders of vagina: Secondary | ICD-10-CM | POA: Diagnosis present

## 2024-10-27 DIAGNOSIS — O365930 Maternal care for other known or suspected poor fetal growth, third trimester, not applicable or unspecified: Secondary | ICD-10-CM | POA: Diagnosis not present

## 2024-10-27 DIAGNOSIS — Z3A36 36 weeks gestation of pregnancy: Secondary | ICD-10-CM

## 2024-10-27 DIAGNOSIS — E039 Hypothyroidism, unspecified: Secondary | ICD-10-CM

## 2024-10-27 DIAGNOSIS — O4290 Premature rupture of membranes, unspecified as to length of time between rupture and onset of labor, unspecified weeks of gestation: Secondary | ICD-10-CM

## 2024-10-27 DIAGNOSIS — Z0371 Encounter for suspected problem with amniotic cavity and membrane ruled out: Secondary | ICD-10-CM | POA: Insufficient documentation

## 2024-10-27 LAB — POCT FERN TEST: POCT Fern Test: NEGATIVE

## 2024-10-27 LAB — RUPTURE OF MEMBRANE (ROM)PLUS: Rom Plus: NEGATIVE
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
## 2024-10-27 NOTE — MAU Note (Signed)
.  Whitney Gonzalez is a 33 y.o. at [redacted]w[redacted]d here in MAU reporting: was seen in the office today at 1630 and noticed a small amount of clear fluid. She was instructed to bee seen in [place] for r/o rupture. She has also noticed increased pressure in her abdomen/vagina today.   Endorse FM and denies vaginal bleeding   Onset of complaint: 10/27/2024 Pain score: 3/10 Vitals:   10/27/24 1757  BP: 117/85  Pulse: 75  Resp: 18     FHT: 135  Lab orders placed from triage: [facility]

## 2024-10-27 NOTE — Progress Notes (Signed)
 PRENATAL VISIT NOTE  Subjective:  Whitney Gonzalez is a 33 y.o. [phone] at [redacted]w[redacted]d being seen today for ongoing prenatal care.  She is currently monitored for the following issues for this high-risk pregnancy and has Bipolar I disorder (HCC); Hypothyroidism; Arthralgia; Chronic fatigue; De Quervain's tenosynovitis, left; Mass of left side of neck; Gastroesophageal reflux disease; Supervision of high risk pregnancy, antepartum; Anxiety and depression; Uncomplicated asthma; History of gestational hypertension; and Poor fetal growth affecting management of mother in third trimester on their problem list.  Patient reports no bleeding, no cramping, occasional contractions, and leaking of fluid, patient unsure if it is urine or amniotic fluid..  Contractions: Irregular. Vag. Bleeding: None.  Movement: Present. Denies leaking of fluid.   The following portions of the patient's history were reviewed and updated as appropriate: allergies, current medications, past family history, past medical history, past social history, past surgical history and problem list.   Objective:   Vitals:   10/27/24 1627  BP: 132/87  Pulse: 97  Weight: 159 lb 9.6 oz (72.4 kg)    Fetal Status:  Fetal Heart Rate (bpm): 141   Movement: Present    General: Alert, oriented and cooperative. Patient is in no acute distress.  Skin: Skin is warm and dry. No rash noted.   Cardiovascular: Normal heart rate noted  Respiratory: Normal respiratory effort, no problems with respiration noted  Abdomen: Soft, gravid, appropriate for gestational age.  Pain/Pressure: Present (pressure)     Pelvic: Cervical exam performed in the presence of a chaperone      1/thick/-3 clear liquid noticed on glove after check  Extremities: Normal range of motion.  Edema: Trace (hands/feet)  Mental Status: Normal mood and affect. Normal behavior. Normal judgment and thought content.      05/19/2024    4:02 PM 12/26/2022   10:08 AM 04/11/2022    2:34  PM  Depression screen PHQ 2/9  Decreased Interest 0 0 0  Down, Depressed, Hopeless 0 0 0  PHQ - 2 Score 0 0 0  Altered sleeping 1 1 0  Tired, decreased energy 1 1 3   Change in appetite 1 0 0  Feeling bad or failure about yourself  0 0 0  Trouble concentrating 0 1 0  Moving slowly or fidgety/restless 0 0 0  Suicidal thoughts 0 0 0  PHQ-9 Score 3  3  3    Difficult doing work/chores Not difficult at all Not difficult at all      Data saved with a previous flowsheet row definition        05/19/2024    4:03 PM 12/26/2022   10:09 AM 04/11/2022    2:35 PM 09/07/2021    1:21 PM  GAD 7 : Generalized Anxiety Score  Nervous, Anxious, on Edge 0 1 1 1   Control/stop worrying 0 0 0 0  Worry too much - different things 0 1 0 1  Trouble relaxing 0 1 0 0  Restless 0 0 0 0  Easily annoyed or irritable 1 0 1 0  Afraid - awful might happen 0 1 0 0  Total GAD 7 Score 1 4 2 2   Anxiety Difficulty Not difficult at all Not difficult at all      Assessment and Plan:  Pregnancy: G5P1031 at [redacted]w[redacted]d 1. Supervision of high risk pregnancy, antepartum (Primary) FHR BP appropriate today - Strep Gp B Culture+Rflx - Cervicovaginal ancillary only  2. History of gestational hypertension Blood pressure within normal limits today  3. Poor  fetal growth affecting management of mother in third trimester, single or unspecified fetus 9th percentile most recent ultrasound Supposed to be scheduled for growth ultrasound follow-up but does not appear that it was scheduled.  Message sent to MFM for scheduling.  4. Mild asthma without complication, unspecified whether persistent Well-controlled at this time  5. Amniotic fluid leaking Concern for rupture of membranes.  Initially noted discharge but no fluid.  Cervical exam performed and noticed clear liquid on glove.  Will send to the MAU for rule out rupture.  6. Hypothyroidism, unspecified type Normal lab work at recent visit  7. [redacted] weeks gestation of  pregnancy GBS and GC collected today  Preterm labor symptoms and general obstetric precautions including but not limited to vaginal bleeding, contractions, leaking of fluid and fetal movement were reviewed in detail with the patient. Please refer to After Visit Summary for other counseling recommendations.   No follow-ups on file.  Future Appointments  Date Time Provider Department Center  11/03/2024  2:45 PM Elnor Channing FALCON, PT [facility] None  11/03/2024  3:55 PM Eldonna Suzen Octave, MD [facility]  11/11/2024  4:15 PM Herchel, Gloris LABOR, MD [facility]  11/17/2024  2:45 PM Elnor Channing FALCON, PT [facility] None  11/17/2024  4:15 PM Cleatus Moccasin, MD [facility]  11/24/2024 10:55 AM Eldonna Suzen Octave, MD [facility]  12/17/2024  2:40 PM Tobb, Dub, DO [facility]    Norleen LULLA Rover, MD

## 2024-10-28 ENCOUNTER — Other Ambulatory Visit: Payer: Self-pay | Admitting: *Deleted

## 2024-10-28 ENCOUNTER — Encounter: Payer: Self-pay | Admitting: Cardiology

## 2024-10-28 DIAGNOSIS — R002 Palpitations: Secondary | ICD-10-CM

## 2024-10-28 DIAGNOSIS — O133 Gestational [pregnancy-induced] hypertension without significant proteinuria, third trimester: Secondary | ICD-10-CM

## 2024-10-28 DIAGNOSIS — E039 Hypothyroidism, unspecified: Secondary | ICD-10-CM

## 2024-10-28 DIAGNOSIS — O365930 Maternal care for other known or suspected poor fetal growth, third trimester, not applicable or unspecified: Secondary | ICD-10-CM

## 2024-10-29 LAB — CERVICOVAGINAL ANCILLARY ONLY
Chlamydia: NEGATIVE
Comment: NEGATIVE
Comment: NORMAL
Neisseria Gonorrhea: NEGATIVE

## 2024-11-01 LAB — STREP GP B CULTURE+RFLX: Strep Gp B Culture+Rflx: NEGATIVE

## 2024-11-03 ENCOUNTER — Encounter: Admitting: Physical Therapy

## 2024-11-03 ENCOUNTER — Ambulatory Visit: Attending: Obstetrics and Gynecology

## 2024-11-03 ENCOUNTER — Ambulatory Visit: Admitting: Family Medicine

## 2024-11-03 ENCOUNTER — Encounter: Payer: Self-pay | Admitting: Maternal & Fetal Medicine

## 2024-11-03 ENCOUNTER — Ambulatory Visit: Admitting: Maternal & Fetal Medicine

## 2024-11-03 VITALS — BP 127/82 | HR 84 | Wt 162.0 lb

## 2024-11-03 VITALS — BP 117/81 | HR 103

## 2024-11-03 DIAGNOSIS — Z3A37 37 weeks gestation of pregnancy: Secondary | ICD-10-CM

## 2024-11-03 DIAGNOSIS — O365930 Maternal care for other known or suspected poor fetal growth, third trimester, not applicable or unspecified: Secondary | ICD-10-CM

## 2024-11-03 DIAGNOSIS — E039 Hypothyroidism, unspecified: Secondary | ICD-10-CM | POA: Insufficient documentation

## 2024-11-03 DIAGNOSIS — O133 Gestational [pregnancy-induced] hypertension without significant proteinuria, third trimester: Secondary | ICD-10-CM | POA: Insufficient documentation

## 2024-11-03 DIAGNOSIS — O0990 Supervision of high risk pregnancy, unspecified, unspecified trimester: Secondary | ICD-10-CM | POA: Insufficient documentation

## 2024-11-03 DIAGNOSIS — Z8759 Personal history of other complications of pregnancy, childbirth and the puerperium: Secondary | ICD-10-CM | POA: Diagnosis not present

## 2024-11-03 DIAGNOSIS — O99283 Endocrine, nutritional and metabolic diseases complicating pregnancy, third trimester: Secondary | ICD-10-CM | POA: Insufficient documentation

## 2024-11-03 DIAGNOSIS — M654 Radial styloid tenosynovitis [de Quervain]: Secondary | ICD-10-CM

## 2024-11-03 DIAGNOSIS — O0993 Supervision of high risk pregnancy, unspecified, third trimester: Secondary | ICD-10-CM

## 2024-11-03 DIAGNOSIS — O09293 Supervision of pregnancy with other poor reproductive or obstetric history, third trimester: Secondary | ICD-10-CM

## 2024-11-03 NOTE — Progress Notes (Signed)
 Patient information  Patient Name: Whitney Gonzalez  Patient MRN:   [id number]  Referring practice: MFM Referring Provider: [facility])  Problem List   Patient Active Problem List   Diagnosis Date Noted   Poor fetal growth affecting management of mother in third trimester 10/07/2024   Anxiety and depression 05/19/2024   Uncomplicated asthma 05/19/2024   History of gestational hypertension 05/19/2024   Supervision of high risk pregnancy, antepartum 05/15/2024   Gastroesophageal reflux disease 04/11/2022   Mass of left side of neck 08/15/2021   De Quervain's tenosynovitis, left 10/27/2019   Arthralgia 08/06/2019   Chronic fatigue 08/06/2019   Hypothyroidism 05/12/2019   Bipolar I disorder (HCC) 05/28/2012    Maternal Fetal medicine Consult  Whitney Gonzalez is a 33 y.o. G5P1031 at [redacted]w[redacted]d here for ultrasound and consultation. Whitney Gonzalez is doing well today with no acute concerns. Today we focused on the following:   The patient presents at 37 weeks and 2 days with fetal growth restriction at the 1st percentile overall and umbilical artery Dopplers in the 39th percentile, which are considered within a reassuring range. Today's biophysical profile is 8/8, indicating good fetal well-being. I discussed with her that delivery is recommended at this gestational age for severe growth restriction, and I offered induction of labor today. The patient prefers to wait until Wednesday, which is reasonable given the currently reassuring Dopplers and antenatal testing, and she understands the importance of timely delivery.  I discussed the risk of stillbirth is largely based on the very poor fetal growth and this is why she needs to be delivered and 37-week of pregnancy.  She reports that she has had a few borderline home blood pressures, but her blood pressure in clinic today is normal, and she denies headache, visual changes, right upper quadrant pain,  shortness of breath, or any other symptoms of preeclampsia. She will attend her routine prenatal visit following today's consultation to arrange her induction. All preeclampsia precautions, fetal movement precautions, and instructions for when to present to labor and delivery were reviewed in detail, and she verbalized understanding.  The patient had time to ask questions that were answered to her satisfaction.  She verbalized understanding and agrees to proceed with the plan below.  Standard OB precautions were given to the patient when appropriate based on the gestational age (fetal kick counts, importance of attending prenatal visits, any antenatal testing or ultrasounds that are scheduled as well as monitoring for signs and symptoms that is labor, vaginal bleeding, loss of amniotic fluid, or decreased fetal movement).  Recommendations - Delivery should be scheduled for this coming Wednesday at 37 weeks and 4 days per the patient preference.  I offered earlier delivery today but the patient states Wednesday is better.  Given the BPP of 8 out of 8 and normal Dopplers I think this is reasonable.  I spent 30 minutes reviewing the patients chart, including labs and images as well as counseling the patient about her medical conditions. Greater than 50% of the time was spent in direct face-to-face patient counseling.  [name]  MFM, [place]   11/03/2024  4:01 PM   Review of Systems: A review of systems was performed and was negative except per HPI   Vitals and Physical Exam    11/03/2024    3:17 PM 10/27/2024    5:57 PM 10/27/2024    4:27 PM  Vitals with BMI  Weight   159 lbs  10 oz  BMI   31.17  Systolic 117 117 867  Diastolic 81 85 87  Pulse 103 75 97    Sitting comfortably on the sonogram table Nonlabored breathing Normal rate and rhythm Abdomen is nontender  Past pregnancies OB History  Gravida Para Term Preterm AB Living  5 1 1  0 3 1  SAB IAB Ectopic Multiple Live  Births  3 0 0 0 1    # Outcome Date GA Lbr Len/2nd Weight Sex Type Anes PTL Lv  5 Current           4 Term 05/14/19 [redacted]w[redacted]d 32:28 / 00:30 6 lb 12 oz (3.062 kg) M Vag-Spont EPI  LIV  3 SAB           2 SAB           1 SAB              Future Appointments  Date Time Provider Department Center  11/07/2024 10:00 AM Sheena Pugh, DO [facility]  11/11/2024  4:15 PM Anyanwu, Gloris LABOR, MD [facility]  11/17/2024  2:45 PM Elnor Channing FALCON, PT [facility] None  11/17/2024  4:15 PM Cleatus Moccasin, MD [facility]  11/24/2024 10:55 AM Eldonna Suzen Octave, MD [facility]  12/17/2024  2:40 PM Tobb, Pugh, DO [facility]

## 2024-11-03 NOTE — Progress Notes (Unsigned)
 PRENATAL VISIT NOTE  Subjective:  Whitney Gonzalez is a 33 y.o. [phone] at [redacted]w[redacted]d being seen today for ongoing prenatal care.  She is currently monitored for the following issues for this {Blank single:19197::high-risk,low-risk} pregnancy and has Bipolar I disorder (HCC); Hypothyroidism; Arthralgia; Chronic fatigue; De Quervain's tenosynovitis, left; Mass of left side of neck; Gastroesophageal reflux disease; Supervision of high risk pregnancy, antepartum; Anxiety and depression; Uncomplicated asthma; History of gestational hypertension; and Poor fetal growth affecting management of mother in third trimester on their problem list.  Patient reports {sx:14538}.  Contractions: Not present. Vag. Bleeding: None.  Movement: Present. Denies leaking of fluid.   The following portions of the patient's history were reviewed and updated as appropriate: allergies, current medications, past family history, past medical history, past social history, past surgical history and problem list.   Objective:   Vitals:   11/03/24 1614  BP: (!) 138/94  Pulse: 90  Weight: 162 lb (73.5 kg)    Fetal Status:  Fetal Heart Rate (bpm): 147   Movement: Present    General: Alert, oriented and cooperative. Patient is in no acute distress.  Skin: Skin is warm and dry. No rash noted.   Cardiovascular: Normal heart rate noted  Respiratory: Normal respiratory effort, no problems with respiration noted  Abdomen: Soft, gravid, appropriate for gestational age.  Pain/Pressure: Absent     Pelvic: {Blank single:19197::Cervical exam performed in the presence of a chaperone,Cervical exam deferred}        Extremities: Normal range of motion.  Edema: None  Mental Status: Normal mood and affect. Normal behavior. Normal judgment and thought content.      05/19/2024    4:02 PM 12/26/2022   10:08 AM 04/11/2022    2:34 PM  Depression screen PHQ 2/9  Decreased Interest 0 0 0  Down, Depressed, Hopeless 0 0 0  PHQ - 2  Score 0 0 0  Altered sleeping 1 1 0  Tired, decreased energy 1 1 3   Change in appetite 1 0 0  Feeling bad or failure about yourself  0 0 0  Trouble concentrating 0 1 0  Moving slowly or fidgety/restless 0 0 0  Suicidal thoughts 0 0 0  PHQ-9 Score 3  3  3    Difficult doing work/chores Not difficult at all Not difficult at all      Data saved with a previous flowsheet row definition        05/19/2024    4:03 PM 12/26/2022   10:09 AM 04/11/2022    2:35 PM 09/07/2021    1:21 PM  GAD 7 : Generalized Anxiety Score  Nervous, Anxious, on Edge 0 1 1 1   Control/stop worrying 0 0 0 0  Worry too much - different things 0 1 0 1  Trouble relaxing 0 1 0 0  Restless 0 0 0 0  Easily annoyed or irritable 1 0 1 0  Afraid - awful might happen 0 1 0 0  Total GAD 7 Score 1 4 2 2   Anxiety Difficulty Not difficult at all Not difficult at all      Assessment and Plan:  Pregnancy: [id number] at [redacted]w[redacted]d 1. Supervision of high risk pregnancy, antepartum (Primary) ***  2. Poor fetal growth affecting management of mother in third trimester, single or unspecified fetus ***  3. History of gestational hypertension ***  4. Hypothyroidism, unspecified type ***  5. De Quervain's tenosynovitis, left ***  {Blank single:19197::Term,Preterm} labor symptoms and general obstetric precautions including but not limited  to vaginal bleeding, contractions, leaking of fluid and fetal movement were reviewed in detail with the patient. Please refer to After Visit Summary for other counseling recommendations.   No follow-ups on file.  Future Appointments  Date Time Provider Department Center  11/07/2024 10:00 AM Tobb, Kardie, DO [facility]  11/11/2024  4:15 PM Anyanwu, Gloris LABOR, MD [facility]  11/17/2024  2:45 PM Elnor Channing FALCON, PT [facility] None  11/17/2024  4:15 PM Cleatus Moccasin, MD [facility]  11/24/2024 10:55 AM Eldonna Suzen Octave, MD [facility]  12/17/2024  2:40 PM Tobb, Dub, DO [facility]     Suzen Octave Eldonna, MD

## 2024-11-04 ENCOUNTER — Encounter (HOSPITAL_COMMUNITY): Payer: Self-pay | Admitting: *Deleted

## 2024-11-04 ENCOUNTER — Telehealth (HOSPITAL_COMMUNITY): Payer: Self-pay | Admitting: *Deleted

## 2024-11-04 NOTE — Telephone Encounter (Signed)
 Preadmission screen

## 2024-11-06 ENCOUNTER — Encounter (HOSPITAL_COMMUNITY): Payer: Self-pay | Admitting: Obstetrics & Gynecology

## 2024-11-06 ENCOUNTER — Inpatient Hospital Stay (HOSPITAL_COMMUNITY)

## 2024-11-06 ENCOUNTER — Inpatient Hospital Stay (HOSPITAL_COMMUNITY)
Admission: AD | Admit: 2024-11-06 | Discharge: 2024-11-09 | DRG: 807 | Disposition: A | Payer: Self-pay | Attending: Obstetrics and Gynecology | Admitting: Obstetrics and Gynecology

## 2024-11-06 ENCOUNTER — Other Ambulatory Visit: Payer: Self-pay

## 2024-11-06 ENCOUNTER — Inpatient Hospital Stay (HOSPITAL_COMMUNITY): Admitting: Anesthesiology

## 2024-11-06 DIAGNOSIS — F419 Anxiety disorder, unspecified: Secondary | ICD-10-CM | POA: Diagnosis present

## 2024-11-06 DIAGNOSIS — F32A Depression, unspecified: Secondary | ICD-10-CM | POA: Diagnosis present

## 2024-11-06 DIAGNOSIS — J45909 Unspecified asthma, uncomplicated: Secondary | ICD-10-CM | POA: Diagnosis present

## 2024-11-06 DIAGNOSIS — O365990 Maternal care for other known or suspected poor fetal growth, unspecified trimester, not applicable or unspecified: Principal | ICD-10-CM | POA: Diagnosis present

## 2024-11-06 LAB — CBC
HCT: 41.7 % (ref 36.0–46.0)
Hemoglobin: 14.4 g/dL (ref 12.0–15.0)
MCH: 31.6 pg (ref 26.0–34.0)
MCHC: 34.5 g/dL (ref 30.0–36.0)
MCV: 91.6 fL (ref 80.0–100.0)
Platelets: 231 K/uL (ref 150–400)
RBC: 4.55 MIL/uL (ref 3.87–5.11)
RDW: 13.2 % (ref 11.5–15.5)
WBC: 8 K/uL (ref 4.0–10.5)
nRBC: 0 % (ref 0.0–0.2)

## 2024-11-06 LAB — SYPHILIS: RPR W/REFLEX TO RPR TITER AND TREPONEMAL ANTIBODIES, TRADITIONAL SCREENING AND DIAGNOSIS ALGORITHM: RPR Ser Ql: NONREACTIVE

## 2024-11-06 LAB — TYPE AND SCREEN
ABO/RH(D): O POS
Antibody Screen: NEGATIVE

## 2024-11-06 MED ORDER — EPHEDRINE 5 MG/ML INJ: 10.0000 mg | INTRAVENOUS | Status: DC | PRN

## 2024-11-06 MED ORDER — LACTATED RINGERS IV SOLN
500.0000 mL | INTRAVENOUS | Status: DC | PRN
  Administered 2024-11-07: 500 mL via INTRAVENOUS

## 2024-11-06 MED ORDER — LACTATED RINGERS IV SOLN: INTRAVENOUS | Status: DC

## 2024-11-06 MED ORDER — SOD CITRATE-CITRIC ACID 500-334 MG/5ML PO SOLN: 30.0000 mL | ORAL | Status: DC | PRN

## 2024-11-06 MED ORDER — FLUTICASONE PROPIONATE 50 MCG/ACT NA SUSP
1.0000 | Freq: Every day | NASAL | Status: DC
  Administered 2024-11-06: 1 via NASAL
  Filled 2024-11-06: qty 16

## 2024-11-06 MED ORDER — OXYTOCIN-SODIUM CHLORIDE 30-0.9 UT/500ML-% IV SOLN
1.0000 m[IU]/min | INTRAVENOUS | Status: DC
  Administered 2024-11-06: 2 m[IU]/min via INTRAVENOUS

## 2024-11-06 MED ORDER — TERBUTALINE SULFATE 1 MG/ML IJ SOLN
0.2500 mg | Freq: Once | INTRAMUSCULAR | Status: DC | PRN
  Filled 2024-11-06: qty 1

## 2024-11-06 MED ORDER — ONDANSETRON HCL 4 MG/2ML IJ SOLN
4.0000 mg | Freq: Four times a day (QID) | INTRAMUSCULAR | Status: DC | PRN
  Filled 2024-11-06: qty 2

## 2024-11-06 MED ORDER — LIDOCAINE HCL (PF) 1 % IJ SOLN: 30.0000 mL | INTRAMUSCULAR | Status: DC | PRN

## 2024-11-06 MED ORDER — OXYMETAZOLINE HCL 0.05 % NA SOLN
1.0000 | Freq: Two times a day (BID) | NASAL | Status: DC
  Filled 2024-11-06: qty 30

## 2024-11-06 MED ORDER — DIPHENHYDRAMINE HCL 50 MG/ML IJ SOLN
12.5000 mg | INTRAMUSCULAR | Status: DC | PRN
  Administered 2024-11-06 (×2): 12.5 mg via INTRAVENOUS
  Filled 2024-11-06: qty 1

## 2024-11-06 MED ORDER — PHENYLEPHRINE 80 MCG/ML (10ML) SYRINGE FOR IV PUSH (FOR BLOOD PRESSURE SUPPORT)
80.0000 ug | PREFILLED_SYRINGE | INTRAVENOUS | Status: DC | PRN
  Filled 2024-11-06: qty 10

## 2024-11-06 MED ORDER — TERBUTALINE SULFATE 1 MG/ML IJ SOLN
0.2500 mg | Freq: Once | INTRAMUSCULAR | Status: AC | PRN
  Administered 2024-11-07: 0.25 mg via SUBCUTANEOUS
  Filled 2024-11-06: qty 1

## 2024-11-06 MED ORDER — LIDOCAINE HCL (PF) 1 % IJ SOLN
INTRAMUSCULAR | Status: DC | PRN
  Administered 2024-11-06: 8 mL via EPIDURAL

## 2024-11-06 MED ORDER — LACTATED RINGERS IV SOLN
500.0000 mL | Freq: Once | INTRAVENOUS | Status: AC
  Administered 2024-11-06: 500 mL via INTRAVENOUS

## 2024-11-06 MED ORDER — OXYTOCIN BOLUS FROM INFUSION
333.0000 mL | Freq: Once | INTRAVENOUS | Status: AC
  Administered 2024-11-07: 333 mL via INTRAVENOUS

## 2024-11-06 MED ORDER — OXYTOCIN-SODIUM CHLORIDE 30-0.9 UT/500ML-% IV SOLN
2.5000 [IU]/h | INTRAVENOUS | Status: DC
  Filled 2024-11-06: qty 500

## 2024-11-06 MED ORDER — ZOLPIDEM TARTRATE 5 MG PO TABS: 5.0000 mg | ORAL_TABLET | Freq: Every evening | ORAL | Status: DC | PRN

## 2024-11-06 MED ORDER — OXYCODONE-ACETAMINOPHEN 5-325 MG PO TABS: 2.0000 | ORAL_TABLET | ORAL | Status: DC | PRN

## 2024-11-06 MED ORDER — MISOPROSTOL 50MCG HALF TABLET
50.0000 ug | ORAL_TABLET | ORAL | Status: DC | PRN
  Administered 2024-11-06 (×2): 50 ug via ORAL
  Filled 2024-11-06 (×2): qty 1

## 2024-11-06 MED ORDER — FENTANYL CITRATE (PF) 100 MCG/2ML IJ SOLN: 100.0000 ug | INTRAMUSCULAR | Status: DC | PRN

## 2024-11-06 MED ORDER — OXYCODONE-ACETAMINOPHEN 5-325 MG PO TABS: 1.0000 | ORAL_TABLET | ORAL | Status: DC | PRN

## 2024-11-06 MED ORDER — LEVOTHYROXINE SODIUM 50 MCG PO TABS
25.0000 ug | ORAL_TABLET | Freq: Every day | ORAL | Status: DC
  Administered 2024-11-07 – 2024-11-09 (×3): 25 ug via ORAL
  Filled 2024-11-06 (×5): qty 1

## 2024-11-06 MED ORDER — FENTANYL-BUPIVACAINE-NACL 0.5-0.125-0.9 MG/250ML-% EP SOLN
12.0000 mL/h | EPIDURAL | Status: DC | PRN
  Administered 2024-11-06: 12 mL/h via EPIDURAL
  Filled 2024-11-06: qty 250

## 2024-11-06 MED ORDER — ACETAMINOPHEN 325 MG PO TABS: 650.0000 mg | ORAL_TABLET | ORAL | Status: DC | PRN

## 2024-11-06 MED ORDER — PHENYLEPHRINE 80 MCG/ML (10ML) SYRINGE FOR IV PUSH (FOR BLOOD PRESSURE SUPPORT): 80.0000 ug | PREFILLED_SYRINGE | INTRAVENOUS | Status: DC | PRN

## 2024-11-06 NOTE — Anesthesia Preprocedure Evaluation (Signed)
 Anesthesia Evaluation  Patient identified by MRN, date of birth, ID band Patient awake    Reviewed: Allergy & Precautions, Patient's Chart, lab work & pertinent test results  Airway Mallampati: II  TM Distance: >3 FB Neck ROM: Full    Dental no notable dental hx.    Pulmonary asthma    Pulmonary exam normal breath sounds clear to auscultation       Cardiovascular hypertension, Normal cardiovascular exam Rhythm:Regular Rate:Normal     Neuro/Psych  Headaches PSYCHIATRIC DISORDERS Anxiety Depression Bipolar Disorder      GI/Hepatic Neg liver ROS,GERD  Controlled,,  Endo/Other  Hypothyroidism  BMI 32  Renal/GU negative Renal ROS  negative genitourinary   Musculoskeletal negative musculoskeletal ROS (+)    Abdominal   Peds negative pediatric ROS (+)  Hematology Hb 14.4, plt 231   Anesthesia Other Findings   Reproductive/Obstetrics (+) Pregnancy                              Anesthesia Physical Anesthesia Plan  ASA: 3  Anesthesia Plan: Epidural   Post-op Pain Management:    Induction:   PONV Risk Score and Plan: 2  Airway Management Planned: Natural Airway  Additional Equipment: None  Intra-op Plan:   Post-operative Plan:   Informed Consent: I have reviewed the patients History and Physical, chart, labs and discussed the procedure including the risks, benefits and alternatives for the proposed anesthesia with the patient or authorized representative who has indicated his/her understanding and acceptance.       Plan Discussed with:   Anesthesia Plan Comments: (Significant anxiety surrounding labor experience, discussed extensively with patient and family prior to placing epidural. All questions answered. Labor epidural during last delivery at [facility] worked well.)        Anesthesia Quick Evaluation

## 2024-11-06 NOTE — Progress Notes (Signed)
 LABOR PROGRESS NOTE D/w RN at central station, patient feeling SOB and wheezy. Sitting upright, saying she can't breathe, SpO2 100%, slight wheeze on R per RN exam. Patient has used albuterol  inhaler, Anesthesia has turned down the rate on epidural. Considering DuoNeb.   In to meet the patient at 2115. Patient sitting comfortably in bed, states SOB/heaviness has resolved. Feels turning down the epidural was helpful. On exam, slight EE wheeze RLL posteriorly, resolved with cough. Otherwise CTA.   Discussed AROM and starting pitocin .  Agree 5/60/-2, head well applied. Attempted AROM, unsuccessful. Bloody show.   FHT: baseline 120, mod variability, +accels, -decels; overall category I. Toco: 2 in  A/P:  Start pitocin  2x2, titrate per protocol Attempt AROM next check  Whitney Maier, DO 9:37 PM

## 2024-11-06 NOTE — Progress Notes (Addendum)
 Whitney Gonzalez is a 33 y.o. [phone] at [redacted]w[redacted]d admitted for induction of labor due to IUGR.  Subjective: Patient reports feeling more consistent, uncomfortable contractions. She wishes to get things going and would like a foley balloon, but is very concerned about pain control as it related to the foley balloon placement. Options discussed, patient would prefer an epidural but desires to speak with anesthesia. Patient high anxiety, related to labor progress and expresses concerns about constipation. She is requesting an enema, as well as something to aid in her getting rest.    Objective: BP 115/73   Pulse 81   Temp 98.1 F (36.7 C) (Oral)   Resp 18   Ht 5' (1.524 m)   Wt 74 kg   LMP 02/16/2024   BMI 31.87 kg/m  I/O last 3 completed shifts: In: 1028.8 [I.V.:1028.8] Out: -  No intake/output data recorded.  FHT:  FHR: 125 bpm, variability: moderate,  accelerations:  Present,  decelerations:  Absent UC:   regular, every 2-3 minutes SVE:   Dilation: 2.5 Effacement (%): 60 Station: -3 Exam by:: Monico Caprice, SNM  Labs: Lab Results  Component Value Date   WBC 8.0 11/06/2024   HGB 14.4 11/06/2024   HCT 41.7 11/06/2024   MCV 91.6 11/06/2024   PLT 231 11/06/2024    Assessment / Plan: Induction of labor due to IUGR and gestational hypertension. CNM and SNM to bedside to discuss placement for Foley Balloon. Reviewed risks and benefits with patient. Patient desires to proceed with  placement. FHT Cat 1 prior to placement. SVE 2.5/60/-3 Monico Caprice, SNM. Fetal head felt on exam. Foley balloon placed with 60mL, successful on first attempt.  Patient and fetus tolerated well and FHT remained Cat 1 following placement with consistent contractions.   Labor: Progressing normally, contracting q 2-14m following placement of foley balloon Fetal Wellbeing:  Category I Pain Control:  Labor support without medications. Desires epidural but wishes to speak with anesthesia.  I/D:   n/a Anticipated MOD:  NSVD  Quintus Premo L Janalyn Higby, Student-MidWife 11/06/2024, 7:02 PM

## 2024-11-06 NOTE — Anesthesia Procedure Notes (Signed)
 Epidural Patient location during procedure: OB Start time: 11/06/2024 7:40 PM End time: 11/06/2024 7:50 PM  Staffing Anesthesiologist: Merla Almarie HERO, DO Performed: anesthesiologist   Preanesthetic Checklist Completed: patient identified, IV checked, risks and benefits discussed, monitors and equipment checked, pre-op evaluation and timeout performed  Epidural Patient position: sitting Prep: DuraPrep and site prepped and draped Patient monitoring: continuous pulse ox, blood pressure, heart rate and cardiac monitor Approach: midline Location: L3-L4 Injection technique: LOR air  Needle:  Needle type: Tuohy  Needle gauge: 17 G Needle length: 9 cm Needle insertion depth: 7 cm Catheter type: closed end flexible Catheter size: 19 Gauge Catheter at skin depth: 12 cm Test dose: negative  Assessment Sensory level: T8 Events: blood not aspirated, no cerebrospinal fluid, injection not painful, no injection resistance, no paresthesia and negative IV test  Additional Notes Patient identified. Risks/Benefits/Options discussed with patient including but not limited to bleeding, infection, nerve damage, paralysis, failed block, incomplete pain control, headache, blood pressure changes, nausea, vomiting, reactions to medication both or allergic, itching and postpartum back pain. Confirmed with bedside nurse the patient's most recent platelet count. Confirmed with patient that they are not currently taking any anticoagulation, have any bleeding history or any family history of bleeding disorders. Patient expressed understanding and wished to proceed. All questions were answered. Sterile technique was used throughout the entire procedure. Please see nursing notes for vital signs. Test dose was given through epidural catheter and negative prior to continuing to dose epidural or start infusion. Warning signs of high block given to the patient including shortness of breath, tingling/numbness in  hands, complete motor block, or any concerning symptoms with instructions to call for help. Patient was given instructions on fall risk and not to get out of bed. All questions and concerns addressed with instructions to call with any issues or inadequate analgesia.  Reason for block:procedure for pain

## 2024-11-06 NOTE — Progress Notes (Cosign Needed Addendum)
 Sentoria E Nand is a 33 y.o. [phone] at [redacted]w[redacted]d.  Subjective: Patient walking around on unit, reports that she's feeling baby moving a lot Currently denies feeling contractions at this time.  Denies LOF, or vaginal bleeding.   Objective: BP 115/73   Pulse 81   Temp 98.1 F (36.7 C) (Oral)   Resp 18   Ht 5' (1.524 m)   Wt 74 kg   LMP 02/16/2024   BMI 31.87 kg/m    FHT:  FHR: 130s bpm, variability: Moderate,  accelerations:  15x15 Present,  decelerations:  None UC:  Irregular Q 4-7 minutes, with some irritability Dilation: 1 Effacement (%): 40 Cervical Position: Posterior Station: -3 Presentation: Vertex (done by C. Goodnight RN) Exam by:: Nat Sharps RN  Labs: Results for orders placed or performed during the hospital encounter of 11/06/24 (from the past 24 hours)  Type and screen [facility]     Status: None   Collection Time: 11/06/24  7:10 AM  Result Value Ref Range   ABO/RH(D) O POS    Antibody Screen NEG    Sample Expiration      11/09/2024,2359 Performed at [facility], [address], [place], [place] [address]   CBC     Status: None   Collection Time: 11/06/24  7:13 AM  Result Value Ref Range   WBC 8.0 4.0 - 10.5 K/uL   RBC 4.55 3.87 - 5.11 MIL/uL   Hemoglobin 14.4 12.0 - 15.0 g/dL   HCT 58.2 63.9 - 53.9 %   MCV 91.6 80.0 - 100.0 fL   MCH 31.6 26.0 - 34.0 pg   MCHC 34.5 30.0 - 36.0 g/dL   RDW 86.7 88.4 - 84.4 %   Platelets 231 150 - 400 K/uL   nRBC 0.0 0.0 - 0.2 %  RPR     Status: None   Collection Time: 11/06/24  7:13 AM  Result Value Ref Range   RPR Ser Ql NON REACTIVE NON REACTIVE    Assessment / Plan: [redacted]w[redacted]d week IUP Membranes Intact Labor: Latent labor. Patient has received 2 doses of oral cytotec . SNM discussed possible placement of foley balloon at next cervical exam. Patient denies right now, but wishes to revisit conversation later this evening. Fetal Wellbeing:  Category 1 Pain Control:  Desires  epidural Anticipated MOD:  Hopeful for a vaginal delivery  Ege Muckey L, Student-MidWife 11/06/2024 5:07 PM
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
## 2024-11-07 ENCOUNTER — Encounter (HOSPITAL_COMMUNITY): Payer: Self-pay | Admitting: Obstetrics & Gynecology

## 2024-11-07 ENCOUNTER — Other Ambulatory Visit: Payer: Self-pay | Admitting: Obstetrics and Gynecology

## 2024-11-07 ENCOUNTER — Ambulatory Visit: Admitting: Cardiology

## 2024-11-07 DIAGNOSIS — O134 Gestational [pregnancy-induced] hypertension without significant proteinuria, complicating childbirth: Secondary | ICD-10-CM

## 2024-11-07 DIAGNOSIS — O3663X0 Maternal care for excessive fetal growth, third trimester, not applicable or unspecified: Secondary | ICD-10-CM

## 2024-11-07 DIAGNOSIS — Z3A37 37 weeks gestation of pregnancy: Secondary | ICD-10-CM

## 2024-11-07 DIAGNOSIS — O365930 Maternal care for other known or suspected poor fetal growth, third trimester, not applicable or unspecified: Secondary | ICD-10-CM

## 2024-11-07 DIAGNOSIS — O99284 Endocrine, nutritional and metabolic diseases complicating childbirth: Secondary | ICD-10-CM

## 2024-11-07 LAB — CBC WITH DIFFERENTIAL/PLATELET
Abs Immature Granulocytes: 0.15 K/uL — ABNORMAL HIGH (ref 0.00–0.07)
Basophils Absolute: 0 K/uL (ref 0.0–0.1)
Basophils Relative: 0 %
Eosinophils Absolute: 0.2 K/uL (ref 0.0–0.5)
Eosinophils Relative: 2 %
HCT: 36.9 % (ref 36.0–46.0)
Hemoglobin: 12.8 g/dL (ref 12.0–15.0)
Immature Granulocytes: 1 %
Lymphocytes Relative: 22 %
Lymphs Abs: 2.4 K/uL (ref 0.7–4.0)
MCH: 32 pg (ref 26.0–34.0)
MCHC: 34.7 g/dL (ref 30.0–36.0)
MCV: 92.3 fL (ref 80.0–100.0)
Monocytes Absolute: 1 K/uL (ref 0.1–1.0)
Monocytes Relative: 9 %
Neutro Abs: 6.9 K/uL (ref 1.7–7.7)
Neutrophils Relative %: 66 %
Platelets: 187 K/uL (ref 150–400)
RBC: 4 MIL/uL (ref 3.87–5.11)
RDW: 13.4 % (ref 11.5–15.5)
WBC: 10.7 K/uL — ABNORMAL HIGH (ref 4.0–10.5)
nRBC: 0 % (ref 0.0–0.2)

## 2024-11-07 LAB — COMPREHENSIVE METABOLIC PANEL WITH GFR
ALT: 17 U/L (ref 0–44)
AST: 23 U/L (ref 15–41)
Albumin: 2.5 g/dL — ABNORMAL LOW (ref 3.5–5.0)
Alkaline Phosphatase: 98 U/L (ref 38–126)
Anion gap: 10 (ref 5–15)
BUN: <5 mg/dL — ABNORMAL LOW (ref 6–20)
CO2: 23 mmol/L (ref 22–32)
Calcium: 8.9 mg/dL (ref 8.9–10.3)
Chloride: 104 mmol/L (ref 98–111)
Creatinine, Ser: 0.74 mg/dL (ref 0.44–1.00)
GFR, Estimated: >60 mL/min (ref >60)
Glucose, Bld: 91 mg/dL (ref 70–99)
Potassium: 3.9 mmol/L (ref 3.5–5.1)
Sodium: 137 mmol/L (ref 135–145)
Total Bilirubin: 0.4 mg/dL (ref 0.0–1.2)
Total Protein: 5.7 g/dL — ABNORMAL LOW (ref 6.5–8.1)

## 2024-11-07 MED ORDER — BENZOCAINE-MENTHOL 20-0.5 % EX AERO: 1.0000 | INHALATION_SPRAY | CUTANEOUS | Status: DC | PRN

## 2024-11-07 MED ORDER — SENNOSIDES-DOCUSATE SODIUM 8.6-50 MG PO TABS
2.0000 | ORAL_TABLET | Freq: Every evening | ORAL | Status: DC | PRN
  Administered 2024-11-07: 2 via ORAL
  Filled 2024-11-07: qty 2

## 2024-11-07 MED ORDER — IBUPROFEN 600 MG PO TABS
600.0000 mg | ORAL_TABLET | Freq: Four times a day (QID) | ORAL | Status: DC
  Administered 2024-11-07 – 2024-11-08 (×5): 600 mg via ORAL
  Filled 2024-11-07 (×6): qty 1

## 2024-11-07 MED ORDER — TETANUS-DIPHTH-ACELL PERTUSSIS 5-2-15.5 LF-MCG/0.5 IM SUSP: 0.5000 mL | Freq: Once | INTRAMUSCULAR | Status: DC

## 2024-11-07 MED ORDER — FUROSEMIDE 20 MG PO TABS
20.0000 mg | ORAL_TABLET | Freq: Every day | ORAL | Status: DC
  Administered 2024-11-07 – 2024-11-09 (×3): 20 mg via ORAL
  Filled 2024-11-07 (×3): qty 1

## 2024-11-07 MED ORDER — COCONUT OIL OIL
1.0000 | TOPICAL_OIL | Status: DC | PRN
  Administered 2024-11-07 (×2): 1 via TOPICAL

## 2024-11-07 MED ORDER — FLUTICASONE PROPIONATE 50 MCG/ACT NA SUSP
1.0000 | Freq: Two times a day (BID) | NASAL | Status: DC
  Administered 2024-11-07 – 2024-11-09 (×5): 1 via NASAL
  Filled 2024-11-07: qty 16

## 2024-11-07 MED ORDER — ACETAMINOPHEN 500 MG PO TABS
1000.0000 mg | ORAL_TABLET | Freq: Four times a day (QID) | ORAL | Status: DC
  Filled 2024-11-07 (×4): qty 2

## 2024-11-07 MED ORDER — PRENATAL MULTIVITAMIN CH
1.0000 | ORAL_TABLET | Freq: Every day | ORAL | Status: DC
  Administered 2024-11-07 – 2024-11-09 (×3): 1 via ORAL
  Filled 2024-11-07 (×3): qty 1

## 2024-11-07 MED ORDER — SIMETHICONE 80 MG PO CHEW: 80.0000 mg | CHEWABLE_TABLET | ORAL | Status: DC | PRN

## 2024-11-07 MED ORDER — OXYMETAZOLINE HCL 0.05 % NA SOLN
1.0000 | Freq: Two times a day (BID) | NASAL | Status: DC
  Administered 2024-11-07 – 2024-11-09 (×5): 1 via NASAL
  Filled 2024-11-07: qty 30

## 2024-11-07 MED ORDER — MAGNESIUM HYDROXIDE 400 MG/5ML PO SUSP: 30.0000 mL | Freq: Every day | ORAL | Status: DC | PRN

## 2024-11-07 MED ORDER — WITCH HAZEL-GLYCERIN EX PADS: 1.0000 | MEDICATED_PAD | CUTANEOUS | Status: DC | PRN

## 2024-11-07 MED ORDER — MONTELUKAST SODIUM 10 MG PO TABS
10.0000 mg | ORAL_TABLET | Freq: Every day | ORAL | Status: DC
  Administered 2024-11-07 – 2024-11-08 (×2): 10 mg via ORAL
  Filled 2024-11-07 (×2): qty 1

## 2024-11-07 MED ORDER — POTASSIUM CHLORIDE CRYS ER 20 MEQ PO TBCR
20.0000 meq | EXTENDED_RELEASE_TABLET | Freq: Every day | ORAL | Status: DC
  Administered 2024-11-07 – 2024-11-09 (×3): 20 meq via ORAL
  Filled 2024-11-07 (×3): qty 1

## 2024-11-07 MED ORDER — ONDANSETRON HCL 4 MG PO TABS: 4.0000 mg | ORAL_TABLET | ORAL | Status: DC | PRN

## 2024-11-07 MED ORDER — MEASLES, MUMPS & RUBELLA VAC ~~LOC~~ SUSR: 0.5000 mL | Freq: Once | SUBCUTANEOUS | Status: DC

## 2024-11-07 MED ORDER — ONDANSETRON HCL 4 MG/2ML IJ SOLN: 4.0000 mg | INTRAMUSCULAR | Status: DC | PRN

## 2024-11-07 MED ORDER — DIPHENHYDRAMINE HCL 25 MG PO CAPS: 25.0000 mg | ORAL_CAPSULE | Freq: Four times a day (QID) | ORAL | Status: DC | PRN

## 2024-11-07 MED ORDER — NIFEDIPINE ER OSMOTIC RELEASE 30 MG PO TB24
30.0000 mg | ORAL_TABLET | Freq: Every day | ORAL | Status: DC
  Administered 2024-11-07 – 2024-11-09 (×3): 30 mg via ORAL
  Filled 2024-11-07 (×3): qty 1

## 2024-11-07 MED ORDER — DIBUCAINE (PERIANAL) 1 % EX OINT: 1.0000 | TOPICAL_OINTMENT | CUTANEOUS | Status: DC | PRN

## 2024-11-07 NOTE — Progress Notes (Signed)
 Pt complaining of face and hands swelling. She is also reporting blurry vision. BP 122/97. Per Dr. Magali, check BP q15 minutes for the next hours. Orders for pt labs received.

## 2024-11-07 NOTE — Discharge Summary (Addendum)
 Postpartum Discharge Summary  Date of Service updated 11/09/24     Patient Name: Whitney Gonzalez DOB: March 11, 1991 MRN: [id number]  Date of admission: 11/06/2024 Delivery date:11/07/2024 Delivering provider: DANNY GERALDS Date of discharge: 11/07/2024  Admitting diagnosis: Fetal growth restriction antepartum [O36.5990] Intrauterine pregnancy: [redacted]w[redacted]d     Secondary diagnosis:  Principal Problem:   Fetal growth restriction antepartum  Additional problems: none     Discharge diagnosis: Term Pregnancy Delivered                                              Post partum procedures:none Augmentation: AROM, Pitocin , Cytotec , and IP Foley Complications: None  Hospital course: Induction of Labor With Vaginal Delivery   33 y.o. yo [id number] at [redacted]w[redacted]d was admitted to the hospital 11/06/2024 for induction of labor.  Indication for induction: FGR 1%.  Patient had an labor course complicated by none. Membrane Rupture Time/Date: 12:50 AM,11/07/2024  Delivery Method:Vaginal, Spontaneous Operative Delivery:N/A Episiotomy: None Lacerations:  1st degree;Perineal Details of delivery can be found in separate delivery note.    Patient had a postpartum course complicated by nausea, back pain , and headache, resolved with PO medications/conservative measures. Patient is discharged home 11/07/24.  Newborn Data: Birth date:11/07/2024 Birth time:2:27 AM Gender:Female Living status:Living Apgars:9 ,9  Weight:   Magnesium  Sulfate received: No BMZ received: No Rhophylac:N/A MMR:N/A T-DaP:declined Flu: No RSV Vaccine received: No Transfusion:No  Immunizations received: Immunization History  Administered Date(s) Administered   DTaP 08/12/1992, 10/10/1992, 12/13/1992, 09/19/1993, 06/24/1997   Fluzone Influenza virus vaccine,trivalent (IIV3), split virus 08/30/2010, 09/08/2011, 08/12/2012   HIB (PRP-T) 08/12/1992, 10/10/1992, 12/13/1992, 09/19/1993   HPV Quadrivalent 08/01/2007, 10/08/2007,  02/07/2008   Hepatitis A, Ped/Adol-2 Dose 08/01/2007, 02/07/2008   Hepatitis B, PED/ADOLESCENT 08/12/1992, 10/10/1992, 07/01/1993   Influenza Split 10/05/2006, 10/08/2007, 08/30/2009   Influenza,inj,Quad PF,6+ Mos 09/28/2016, 08/17/2017, 11/15/2018   Influenza,inj,quad, With Preservative 10/16/2014, 10/13/2015   Influenza-Unspecified 08/15/2017   MMR 09/19/1993, 06/24/1997   Meningococcal Conjugate 08/01/2007   OPV 08/12/1992, 10/10/1992, 06/24/1993, 09/19/1993   Td 11/15/2004   Td (Adult) 11/15/2004   Tdap 11/15/2004, 04/27/2009   Varicella 06/24/1997    Physical exam  Vitals:   11/07/24 0030 11/07/24 0059 11/07/24 0100 11/07/24 0130  BP: 108/69 122/85 122/85 128/84  Pulse: 81 91 91 84  Resp:      Temp:   98.3 F (36.8 C)   TempSrc:   Oral   SpO2:      Weight:      Height:       General: alert, cooperative, and no distress Lochia: appropriate Uterine Fundus: firm Incision: N/A DVT Evaluation: No evidence of DVT seen on physical exam.  Labs: Lab Results  Component Value Date   WBC 8.0 11/06/2024   HGB 14.4 11/06/2024   HCT 41.7 11/06/2024   MCV 91.6 11/06/2024   PLT 231 11/06/2024      Latest Ref Rng & Units 10/19/2024    2:45 PM  CMP  Glucose 70 - 99 mg/dL 94   BUN 6 - 20 mg/dL 5   Creatinine 9.55 - 8.99 mg/dL 9.53   Sodium 864 - 854 mmol/L 136   Potassium 3.5 - 5.1 mmol/L 3.9   Chloride 98 - 111 mmol/L 108   CO2 22 - 32 mmol/L 20   Calcium 8.9 - 10.3 mg/dL 8.5   Total Protein 6.5 -  8.1 g/dL 5.7   Total Bilirubin 0.0 - 1.2 mg/dL 0.5   Alkaline Phos 38 - 126 U/L 89   AST 15 - 41 U/L 19   ALT 0 - 44 U/L 12    Edinburgh Score:    06/19/2019    2:30 PM  Edinburgh Postnatal Depression Scale Screening Tool  I have been able to laugh and see the funny side of things. 0   I have looked forward with enjoyment to things. 0   I have blamed myself unnecessarily when things went wrong. 1   I have been anxious or worried for no good reason. 0   I have felt  scared or panicky for no good reason. 0   Things have been getting on top of me. 1   I have been so unhappy that I have had difficulty sleeping. 0   I have felt sad or miserable. 0   I have been so unhappy that I have been crying. 0   The thought of harming myself has occurred to me. 0   Edinburgh Postnatal Depression Scale Total 2      Data saved with a previous flowsheet row definition   No data recorded  After visit meds:   Allergies as of 11/09/2024       Reactions   Azithromycin Other (See Comments), Dermatitis   Steven-Johnsons Other Reaction(s): Zpak Rash   Banana Anaphylaxis   Cetirizine Nausea Only, Other (See Comments)   Flushing H/A   Codeine Palpitations, Other (See Comments)   Heart rate goes over 200bpm Other Reaction(s): palpitations, panic attacks Heart rate goes over 200bpm    Panic Attacks   Doxycycline  Hyclate Shortness Of Breath, Nausea Only   Latex Anaphylaxis, Hives, Rash   Other reaction(s): hives   Mango Flavoring Agent (non-screening) Anaphylaxis   Other Anaphylaxis, Other (See Comments), Rash, Swelling   Banana, mango, avocado Other Reaction(s): Migraines   Propranolol Hcl Shortness Of Breath   Other reaction(s): asthma symptoms worsened   Shellfish Allergy Anaphylaxis   Other reaction(s): throat swelling Scallops only   Cephalexin Rash, Dermatitis   Metronidazole  Hives, Rash   Sulfamethoxazole-trimethoprim Rash, Dermatitis   Other Reaction(s): rash, past last dose of med   Allegra [fexofenadine] Hives   Avocado    Other Reaction(s): told to stay away from due to banana/mango/latex allergy   Carbamazepine Other (See Comments)   Other reaction(s): steven's -johnson syndrome Mannie Louder Other Reaction(s): steven's -johnson syndrome Other reaction(s): steven's -johnson syndrome Mannie Louder    Steven's-Johnson Syndrome   Chlorpheniramine    Other Reaction(s): dizzines, flushing h/a, nausea   Estrogens Other (See Comments)    Estrogen related BCP- Migraines Other Reaction(s): Estrogen based oral Contraceptives - Migraines   Sprintec 28 [norgestimate-eth Estradiol ] Nausea And Vomiting   Tobacco Swelling   Amoxicillin -pot Clavulanate Nausea And Vomiting, Rash   Other reaction(s): rash, mood destabilization Other Reaction(s): rash, mood destabilization   Penicillins Rash, Dermatitis, Nausea And Vomiting, Other (See Comments)   Has patient had a PCN reaction causing immediate rash, facial/tongue/throat swelling, SOB or lightheadedness with hypotension: unknown Has patient had a PCN reaction causing severe rash involving mucus membranes or skin necrosis: unknown Has patient had a PCN reaction that required hospitalization unknown Has patient had a PCN reaction occurring within the last 10 years: unknown If all of the above answers are NO, then may proceed with Cephalosporin use. Other Reaction(s): rash, mood destabilization, rash/nausea Other reaction(s): rash, mood destabilization    Has patient had  a PCN reaction causing immediate rash, facial/tongue/throat swelling, SOB or lightheadedness with hypotension: unknown Has patient had a PCN reaction causing severe rash involving mucus membranes or skin necrosis: unknown Has patient had a PCN reaction that required hospitalization unknown Has patient had a PCN reaction occurring within the last 10 years: unknown If all of the above answers are NO, then may proceed with Cephalosporin use.    Mood Destabilization    Mood destabilization        Medication List     TAKE these medications    albuterol  108 (90 Base) MCG/ACT inhaler Commonly known as: VENTOLIN  HFA Inhale 1-2 puffs into the lungs every 6 (six) hours as needed for wheezing or shortness of breath.   albuterol  (2.5 MG/3ML) 0.083% nebulizer solution Commonly known as: PROVENTIL  Take 3 mLs (2.5 mg total) by nebulization every 6 (six) hours as needed for wheezing or shortness of breath.   aspirin  EC  81 MG tablet Take 2 tablets (162 mg total) by mouth daily. Swallow whole.   Budesonide  90 MCG/ACT inhaler Inhale 1 puff into the lungs 2 (two) times daily.   EPINEPHrine 0.3 mg/0.3 mL Soaj injection Commonly known as: EPI-PEN Inject 0.3 mg into the muscle as needed for anaphylaxis.   furosemide  20 MG tablet Commonly known as: LASIX  Take 1 tablet (20 mg total) by mouth daily.   ibuprofen  800 MG tablet Commonly known as: ADVIL  Take 1 tablet (800 mg total) by mouth every 6 (six) hours.   levothyroxine  25 MCG tablet Commonly known as: SYNTHROID  TAKE 1 TABLET BY MOUTH DAILY BEFORE BREAKFAST   montelukast  10 MG tablet Commonly known as: Singulair  Take 1 tablet (10 mg total) by mouth at bedtime.   NIFEdipine  30 MG 24 hr tablet Commonly known as: ADALAT  CC Take 1 tablet (30 mg total) by mouth daily.   potassium chloride  SA 20 MEQ tablet Commonly known as: KLOR-CON  M Take 1 tablet (20 mEq total) by mouth daily.   Prenatal Complete 14-0.4 MG Tabs Take 1 tablet by mouth daily.          Discharge home in stable condition Infant Feeding: Breast Infant Disposition:home with mother Discharge instruction: per After Visit Summary and Postpartum booklet. Activity: Advance as tolerated. Pelvic rest for 6 weeks.  Diet: routine diet Future Appointments: Future Appointments  Date Time Provider Department Center  11/07/2024 10:00 AM Tobb, Kardie, DO [facility]  11/11/2024  4:15 PM Anyanwu, Gloris LABOR, MD [facility]  11/17/2024  2:45 PM Elnor Channing FALCON, PT [facility] None  11/17/2024  4:15 PM Cleatus Moccasin, MD [facility]  11/24/2024 10:55 AM Eldonna Suzen Octave, MD [facility]  12/17/2024  2:40 PM Tobb, Kardie, DO [facility]   Follow up Visit: Message sent 12/5  Please schedule this patient for a In person postpartum visit in 6 weeks with the following provider: Any provider. Additional Postpartum F/U:Postpartum Depression checkup  High risk pregnancy complicated by:  FGR Delivery mode:  Vaginal, Spontaneous Anticipated Birth Control:  Unsure   11/07/2024 Barabara Maier, DO   Olam Boards, CNM 9:32 AM

## 2024-11-07 NOTE — Progress Notes (Signed)
 LABOR PROGRESS NOTE Called to room for FHR deceleration Pit off, IVF bolus running SCE: 8cm  Ctx q60-90sec, decision to terb Change position to R sidelying Good recovery FHR  Rechecked after of fetal recovery, 10/100/+1 FHT: baseline 130, mod variability, +accels, +decels; overall category II. Toco: flat, q2-78min per patient  A&P Prepare for delivery   [employer] [facility], DO 3:05 AM

## 2024-11-07 NOTE — Lactation Note (Signed)
 This note was copied from a baby's chart. Lactation Consultation Note  Patient Name: Whitney Gonzalez Unijb'd Date: 11/07/2024 Age:32 hours Reason for consult: Initial assessment;Early term 37-38.6wks  P2 mom stated she tried to BF but couldn't d/t tongue and lip tie. Mom pumped for 3 months and was an over producer and had enough BM for 3 extra months and gave some away. LC gave mom Plan of Care feeding plan. Mom done teach back to clarify understanding. LPI information sheet given and informed about timed BF and conserving energy. Mom states understanding. Attempted to give baby 22 cal. Formula but baby wasn't interested in feeding. Baby was awake and alert but wouldn't suckle on bottle. LC attempted to squeeze nipple a little to express formula into mouth hoping for stimulation. But baby wasn't interested. Baby did try to gag earlier so she probably has some fluid in her stomach and isn't hungry. No feeding cues noted. Discussed w/mom pumping. Mom in agreement of pumping. LC got mom pumping before left. Mom shown how to use DEBP & how to disassemble, clean, & reassemble parts. Instructed mom to pump every 3 hrs. Mom in agreement.  Encouraged mom to call for assistance as needed.   Maternal Data Has patient been taught Hand Expression?: Yes Does the patient have breastfeeding experience prior to this delivery?: Yes How long did the patient breastfeed?: tongue tie/lip tie  Feeding Mother's Current Feeding Choice: Breast Milk and Formula Nipple Type: Nfant Slow Flow (purple)  LATCH Score Latch: Too sleepy or reluctant, no latch achieved, no sucking elicited.     Type of Nipple: Everted at rest and after stimulation  Comfort (Breast/Nipple): Soft / non-tender         Lactation Tools Discussed/Used Tools: Pump;Flanges;Coconut oil Flange Size: 18 Breast pump type: Double-Electric Breast Pump Pump Education: Setup, frequency, and cleaning;Milk Storage Reason for  Pumping: less than 6 lbs Pumping frequency: q 3hr Pumped volume: 1 mL  Interventions Interventions: Breast feeding basics reviewed;Hand express;Breast compression;Expressed milk;Coconut oil;DEBP;Education;LC Services brochure;Infant Driven Feeding Algorithm education;LPT handout/interventions  Discharge Discharge Education: Outpatient recommendation Pump: DEBP  Consult Status Consult Status: Follow-up Date: 11/07/24 Follow-up type: In-patient    Anjel Perfetti G 11/07/2024, 7:13 AM

## 2024-11-07 NOTE — Progress Notes (Signed)
 LABOR PROGRESS NOTE Pt rechecked at 0050 Patient comfortable with epidural. Pit at 2.  SCE: 6/70/-2 AROM scant fluid, bloody show  FHT: baseline 130, mod variability, +accels, -decels; overall category I. Toco: q2 min  A/P:  Continue to titrate pitocin  per protocol  [facility], DO 1:02 AM

## 2024-11-07 NOTE — Anesthesia Postprocedure Evaluation (Signed)
 Anesthesia Post Note  Patient: Whitney Gonzalez  Procedure(s) Performed: AN AD HOC LABOR EPIDURAL     Patient location during evaluation: Mother Baby Anesthesia Type: Epidural Level of consciousness: awake and alert and oriented Pain management: satisfactory to patient Vital Signs Assessment: post-procedure vital signs reviewed and stable Respiratory status: respiratory function stable Cardiovascular status: stable Postop Assessment: no headache, no backache, epidural receding, patient able to bend at knees, no signs of nausea or vomiting, adequate PO intake and able to ambulate Anesthetic complications: no   No notable events documented.  Last Vitals:  Vitals:   11/07/24 1017 11/07/24 1115  BP: 124/88 (!) 133/94  Pulse: 79 89  Resp: 20   Temp: 36.6 C   SpO2: 100%     Last Pain:  Vitals:   11/07/24 1115  TempSrc:   PainSc: 4    Pain Goal:                   Krish Bailly

## 2024-11-07 NOTE — Lactation Note (Signed)
 This note was copied from a baby's chart. Lactation Consultation Note  Patient Name: Whitney Gonzalez Date: 11/07/2024 Age:32 hours, P2  Reason for consult: Follow-up assessment;Early term 37-38.6wks;Infant < 6lbs;Breastfeeding assistance The MBU nurse and the mom requested to be seen.  LC offered to assist and mom receptive. LC changed 3 small stools during the consult.  LC assisted to latch the baby on the left breast football and at 1st it was attempts due to areola edema and the reverse pressure helped.  Baby ended latching for 22 mins with swallows and increased with breast compressions.. nipple well rounded when the baby released.  Mom aware to breast feed 1st and then supplement. EBM or formula. Post pump both breast for 15 mins and save the milk for the next feeding.  Breast feeding goals for 24 hours - feed with cues and by 3 hours.    Maternal Data Has patient been taught Hand Expression?: Yes Does the patient have breastfeeding experience prior to this delivery?: Yes  Feeding Mother's Current Feeding Choice: Breast Milk and Formula Nipple Type: Extra Slow Flow  LATCH Score Latch: Grasps breast easily, tongue down, lips flanged, rhythmical sucking.  Audible Swallowing: Spontaneous and intermittent  Type of Nipple: Everted at rest and after stimulation  Comfort (Breast/Nipple): Soft / non-tender  Hold (Positioning): Assistance needed to correctly position infant at breast and maintain latch.  LATCH Score: 9   Lactation Tools Discussed/Used Tools: Pump;Flanges Flange Size:  (per mom the #18 F's are to snug and LC checked the #21 F and per mom more comfortable . tested it for pre-pumping.)  Interventions Interventions: Breast feeding basics reviewed;Assisted with latch;Skin to skin;Breast massage;Hand express;Pre-pump if needed;Reverse pressure;Adjust position;Breast compression;Support pillows;Position options;Expressed milk;Coconut oil;Shells;Hand  pump;DEBP;Education;LC Services brochure;CDC milk storage guidelines;CDC Guidelines for Breast Pump Cleaning  Discharge Pump: DEBP;Personal;Manual  Consult Status Consult Status: Follow-up Date: 11/08/24 Follow-up type: In-patient    Rollene Caldron Jasmon Mattice 11/07/2024, 4:15 PM

## 2024-11-07 NOTE — Progress Notes (Signed)
 Pt has been experiencing elevated blood pressures with no preE symptoms. Dr. Magali notified. Pt has also been requesting Afrin for her nasal congestion. This morning, pt told RN that she brought her Pulmicort  inhaler from home that she uses 2x day, in the morning and the evening. RN notified Dr. Magali of the pt's med request and of her home med use. Per Dr. Cashion, it is okay for the pt to continue to use her inhaler while in the hospital. New medication orders received.
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
## 2024-11-08 LAB — CBC
HCT: 37.3 % (ref 36.0–46.0)
Hemoglobin: 12.9 g/dL (ref 12.0–15.0)
MCH: 31.8 pg (ref 26.0–34.0)
MCHC: 34.6 g/dL (ref 30.0–36.0)
MCV: 91.9 fL (ref 80.0–100.0)
Platelets: 207 K/uL (ref 150–400)
RBC: 4.06 MIL/uL (ref 3.87–5.11)
RDW: 13.3 % (ref 11.5–15.5)
WBC: 9.7 K/uL (ref 4.0–10.5)
nRBC: 0 % (ref 0.0–0.2)

## 2024-11-08 LAB — COMPREHENSIVE METABOLIC PANEL WITH GFR
ALT: 18 U/L (ref 0–44)
AST: 22 U/L (ref 15–41)
Albumin: 2.6 g/dL — ABNORMAL LOW (ref 3.5–5.0)
Alkaline Phosphatase: 101 U/L (ref 38–126)
Anion gap: 7 (ref 5–15)
BUN: 8 mg/dL (ref 6–20)
CO2: 25 mmol/L (ref 22–32)
Calcium: 9 mg/dL (ref 8.9–10.3)
Chloride: 106 mmol/L (ref 98–111)
Creatinine, Ser: 0.64 mg/dL (ref 0.44–1.00)
GFR, Estimated: >60 mL/min (ref >60)
Glucose, Bld: 114 mg/dL — ABNORMAL HIGH (ref 70–99)
Potassium: 3.9 mmol/L (ref 3.5–5.1)
Sodium: 138 mmol/L (ref 135–145)
Total Bilirubin: 0.5 mg/dL (ref 0.0–1.2)
Total Protein: 6 g/dL — ABNORMAL LOW (ref 6.5–8.1)

## 2024-11-08 MED ORDER — IBUPROFEN 800 MG PO TABS
800.0000 mg | ORAL_TABLET | Freq: Four times a day (QID) | ORAL | Status: DC
  Administered 2024-11-08 – 2024-11-09 (×5): 800 mg via ORAL
  Filled 2024-11-08 (×5): qty 1

## 2024-11-08 MED ORDER — BUDESONIDE 90 MCG/ACT IN AEPB
90.0000 ug | INHALATION_SPRAY | Freq: Two times a day (BID) | RESPIRATORY_TRACT | Status: DC
  Administered 2024-11-08 – 2024-11-09 (×3): 1 via RESPIRATORY_TRACT

## 2024-11-08 MED ORDER — ALBUTEROL SULFATE (2.5 MG/3ML) 0.083% IN NEBU
2.5000 mg | INHALATION_SOLUTION | RESPIRATORY_TRACT | Status: DC | PRN
  Administered 2024-11-08 – 2024-11-09 (×4): 2.5 mg via RESPIRATORY_TRACT
  Filled 2024-11-08 (×4): qty 3

## 2024-11-08 NOTE — Progress Notes (Signed)
 OB/GYN Faculty Attending Note  Post Partum Day 1  Subjective: Patient is feeling okay, reports some intermittent white stars when she stands or sits up quickly. Also having some intermittent nausea but has been able to keep down food/drink. Reports intermittent headache as well and veyr poor sleep. She is breastfeeding which is going well, but has not gotten much sleep as she is pumping after feeding every 2 hours. Also reports poorly controlled asthma on her home pulmicort , woke up with asthma attack this afternoon. She does report back pain and wrist pain as well, pelvic/abdominal pain is minimal. Minimal bleeding. Baby is in room and doing well.   Objective: Blood pressure (!) 130/91, pulse 74, temperature 98.1 F (36.7 C), temperature source Oral, resp. rate 17, height 5' (1.524 m), weight 74 kg, last menstrual period 02/16/2024, SpO2 99%, unknown if currently breastfeeding. Temp:  [98.1 F (36.7 C)-98.6 F (37 C)] 98.1 F (36.7 C) (12/06 0551) Pulse Rate:  [74-86] 74 (12/06 0551) Resp:  [17-18] 17 (12/06 0551) BP: (102-132)/(71-97) 130/91 (12/06 0953) SpO2:  [99 %-100 %] 99 % (12/06 0551)  Physical Exam:  General: alert, oriented, cooperative Chest: normal respiratory effort Heart: regular rate  Abdomen: soft, appropriately tender to palpation  Uterine Fundus: firm, at level of the below the umbilicus Lochia: minimal, rubra DVT Evaluation: no evidence of DVT Extremities: no edema, no calf tenderness  UOP: voiding spontaneously  Recent Labs    11/06/24 0713 11/07/24 2051  HGB 14.4 12.8  HCT 41.7 36.9    Assessment/Plan: Patient Active Problem List   Diagnosis Date Noted   Fetal growth restriction antepartum 11/06/2024   Poor fetal growth affecting management of mother in third trimester 10/07/2024   Anxiety and depression 05/19/2024   Uncomplicated asthma 05/19/2024   History of gestational hypertension 05/19/2024   Supervision of high risk pregnancy, antepartum  05/15/2024   Gastroesophageal reflux disease 04/11/2022   Mass of left side of neck 08/15/2021   De Quervain's tenosynovitis, left 10/27/2019   Arthralgia 08/06/2019   Chronic fatigue 08/06/2019   Hypothyroidism 05/12/2019   Bipolar I disorder (HCC) 05/28/2012    Patient is 33 y.o. H4E7967 PPD#1 s/p SVD at [redacted]w[redacted]d. Course complicated by gestational hypertension for which she was started on nifedipine , lasix  and potassium. She has had some stars and headaches, BP well controlled on procardia  but will obtain labs and encouraged PO hydration. Increased ibuprofen  dose as well to improve pain control, which seems to be mostly back/wrist. She is doing okay, recovering appropriately.   CBC/CMP now Cont PO hydration Home pulmicort  to be verified with pharmacy Nebulizer treatments ordered Continue routine post partum care Pain meds prn Regular diet nothing for birth control Plan for discharge possibly tomorrow    K. Yolanda Moats, MD, [facility] Attending [facility] [employer] (Faculty Practice)  11/08/2024, 1:19 PM

## 2024-11-08 NOTE — Progress Notes (Signed)
 Patient states that she is seeing stars and is nauseated. Patient also states that she feels swollen in her hands and her face and legs, in which she feels tight, stiff and tingly. Blood pressure is 130/91. Notified Dr. Nicholaus who states she will round on patient soon. Tammy, [place]
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
## 2024-11-09 ENCOUNTER — Other Ambulatory Visit (HOSPITAL_COMMUNITY): Payer: Self-pay

## 2024-11-09 MED ORDER — IBUPROFEN 800 MG PO TABS
800.0000 mg | ORAL_TABLET | Freq: Four times a day (QID) | ORAL | 0 refills | Status: DC
  Filled 2024-11-09: qty 30, 8d supply, fill #0

## 2024-11-09 MED ORDER — FUROSEMIDE 20 MG PO TABS
20.0000 mg | ORAL_TABLET | Freq: Every day | ORAL | 0 refills | Status: DC
  Filled 2024-11-09: qty 5, 5d supply, fill #0

## 2024-11-09 MED ORDER — NIFEDIPINE ER 30 MG PO TB24
30.0000 mg | ORAL_TABLET | Freq: Every day | ORAL | 2 refills | Status: DC
  Filled 2024-11-09: qty 30, 30d supply, fill #0

## 2024-11-09 MED ORDER — POTASSIUM CHLORIDE CRYS ER 20 MEQ PO TBCR
20.0000 meq | EXTENDED_RELEASE_TABLET | Freq: Every day | ORAL | 0 refills | Status: AC
  Filled 2024-11-09: qty 5, 5d supply, fill #0

## 2024-11-09 MED ORDER — ALBUTEROL SULFATE (2.5 MG/3ML) 0.083% IN NEBU: 2.5000 mg | INHALATION_SOLUTION | Freq: Four times a day (QID) | RESPIRATORY_TRACT | 0 refills | Status: AC | PRN

## 2024-11-09 NOTE — Lactation Note (Signed)
 This note was copied from a baby's chart. 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  Baby P2, is latching well on her left breast with no concerns, MOB is working on infant latching well on her right breast. Infant born at 2430 g and following the LPTI feeding guidelines.  LC did not observe latch at 0600 am, Per MOB infant briefly latched on left breast, infant given 30 mls of EBM and then MOB used the DEBP afterwards. MOB will limit breast (chest) feeding to 15 minutes and afterwards supplement infant with her EBM. Day 2 will continue to offer the 30 mls if infant will consume it, the recommended intake is (15-18) mls per feeding for LBW/LPTI. MOB will continue to pump every 3 hours , or pump if feeling fullness but only pumping  to soften breast or prevent discomfort and then continue to latch infant for the next feeding. LC discussed engorgement prevention and treatment as well as mastitis. MOB informed LC she is taking sunflower lecithin, twice daily, morning and night.  [facility] sent referral for [facility] clinic for continued breastfeeding support and latch assistance with MOB left breast after hospital discharge.  Current feeding plan: 1- MOB will continue to follow LPTI feeding guidelines, limit infant total feedings to 30 minutes or less, offer breast for 15 minutes or less, then supplement infant with her EBM (15-18 mls) or more if infant wants it. 2- Continue to pump every 3 hours or pump to soften breast prior to 3 hours if feeling discomfort but not empty breast. 3- Will ask Pediatrician, MD to do assessment of infant's oral anatomy and MOB has tongue and lip tie community  resources. 4- MOB will be seen by [facility] Outpatient clinic   Discharge education 1- LC discussed engorgement prevention and treatment as well as mastitis. 2-LC discussed warning signs of dehydration in infant.     Maternal Data    Feeding Mother's Current Feeding Choice: Breast Milk and Formula Nipple Type: Nfant Standard Flow (white)  LATCH Score LC did not observe latch due infant recently breastfeeding and then receiving 30 mls of EBM that MOB pumped.  Lactation Tools Discussed/Used Pumped volume: 35 mL (MOB is expressing 35 mls when pumping)  Interventions Interventions: Position options;Education;Guidelines for Milk Supply and Pumping Schedule Handout;LC Services brochure;LPT handout/interventions;CDC milk storage guidelines;CDC Guidelines for Breast Pump Cleaning  Discharge Discharge Education: Warning signs for feeding baby;Outpatient recommendation;Outpatient Epic message sent;Engorgement and breast care Pump: Hands Free;Personal;DEBP;Manual (MOB has Baby Budda  hands free and Medela plug in DEBP)  Consult Status Consult Status: Complete    Whitney Gonzalez 11/09/2024, 8:02 AM

## 2024-11-09 NOTE — Progress Notes (Signed)
 Delivered patient's [facility] prescriptions to patient. Discussed medications and reviewed AVS with patient. Tammy, Steva [place]

## 2024-11-09 NOTE — Progress Notes (Incomplete)
 Post Partum Day *** Subjective: {subjective:3041426}  Objective: Blood pressure 113/80, pulse (!) 112, temperature (!) 97.4 F (36.3 C), temperature source Oral, resp. rate 18, height 5' (1.524 m), weight 74 kg, last menstrual period 02/16/2024, SpO2 96%, unknown if currently breastfeeding.  Physical Exam:  General: {Exam; general:16600} Lochia: {Desc; appropriate/inappropriate:30686} Uterine Fundus: {Desc; firm/soft:30687} Incision: {Exam; incision:13523} DVT Evaluation: {Exam; dvt:13533}  Recent Labs    11/07/24 2051 11/08/24 1411  HGB 12.8 12.9  HCT 36.9 37.3    Assessment/Plan: {assessment/plan:3041427}   LOS: 3 days   Olam Boards, CNM 11/09/2024, 10:05 AM

## 2024-11-10 NOTE — BH Specialist Note (Deleted)
 Integrated Behavioral Health via Telemedicine Visit  11/10/2024 [name] [id number]  Number of Integrated Behavioral Health Clinician visits: No data recorded Session Start time: No data recorded  Session End time: No data recorded Total time in minutes: No data recorded   Referring Provider: *** Patient/Family location: *** [facility] Provider location: *** All persons participating in visit: *** Types of Service: {CHL AMB TYPE OF [phone]}  I connected with Whitney Gonzalez and/or Whitney Gonzalez's {family members:20773} via  Telephone or Engineer, Civil (consulting)  (Video is Surveyor, mining) and verified that I am speaking with the correct person using two identifiers. Discussed confidentiality: {YES/NO:21197}  I discussed the limitations of telemedicine and the availability of in person appointments.  Discussed there is a possibility of technology failure and discussed alternative modes of communication if that failure occurs.  I discussed that engaging in this telemedicine visit, they consent to the provision of behavioral healthcare and the services will be billed under their insurance.  Patient and/or legal guardian expressed understanding and consented to Telemedicine visit: {YES/NO:21197}  Presenting Concerns: Patient and/or family reports the following symptoms/concerns: *** Duration of problem: ***; Severity of problem: {Mild/Moderate/Severe:20260}  Patient and/or Family's Strengths/Protective Factors: {CHL AMB BH PROTECTIVE FACTORS:[phone]}  Goals Addressed: Patient will:  Reduce symptoms of: {IBH Symptoms:21014056}   Increase knowledge and/or ability of: {IBH Patient Tools:21014057}   Demonstrate ability to: {IBH Goals:21014053}  Progress towards Goals: {CHL AMB BH PROGRESS TOWARDS GOALS:[phone]}    Interventions: Interventions utilized:  {IBH Interventions:21014054} Standardized Assessments completed:  {IBH Screening Tools:21014051}    Patient and/or Family Response: ***  Clinical Assessment/Diagnosis  No diagnosis found.    Assessment: Patient currently experiencing ***.   Patient may benefit from ***.  Plan: Follow up with behavioral health clinician on : *** Behavioral recommendations: *** Referral(s): {IBH Referrals:21014055}  I discussed the assessment and treatment plan with the patient and/or parent/guardian. They were provided an opportunity to ask questions and all were answered. They agreed with the plan and demonstrated an understanding of the instructions.   They were advised to call back or seek an in-person evaluation if the symptoms worsen or if the condition fails to improve as anticipated.  Whitney Gonzalez C Adger Cantera, LCSW

## 2024-11-11 ENCOUNTER — Encounter: Admitting: Obstetrics & Gynecology

## 2024-11-11 LAB — SURGICAL PATHOLOGY

## 2024-11-12 ENCOUNTER — Telehealth: Payer: Self-pay | Admitting: General Practice

## 2024-11-12 NOTE — Telephone Encounter (Signed)
 Patient called and left message on nurse voicemail line stating she reviewed the results of her placenta pathology and would like for someone to explain the results to her and what it could mean for her and her daughter. She is also curious if her baby might need an EEG? She requests someone send a mychart message with a response. Will send message.

## 2024-11-13 ENCOUNTER — Encounter: Payer: Self-pay | Admitting: Family Medicine

## 2024-11-13 DIAGNOSIS — M6530 Trigger finger, unspecified finger: Secondary | ICD-10-CM

## 2024-11-13 DIAGNOSIS — G5600 Carpal tunnel syndrome, unspecified upper limb: Secondary | ICD-10-CM

## 2024-11-15 ENCOUNTER — Telehealth (HOSPITAL_COMMUNITY): Payer: Self-pay

## 2024-11-15 MED ORDER — NIFEDIPINE ER 60 MG PO TB24: 60.0000 mg | ORAL_TABLET | Freq: Two times a day (BID) | ORAL | 1 refills | Status: DC

## 2024-11-15 NOTE — Telephone Encounter (Signed)
 11/15/2024 9093  Name: Whitney Gonzalez MRN: [id number] DOB: 01-02-1991  Reason for Call:  Transition of Care Hospital Discharge Call  Contact Status: Patient Contact Status: Message  Language assistant needed:          Follow-Up Questions:    Van Postnatal Depression Scale:  In the Past 7 Days:    PHQ2-9 Depression Scale:     Discharge Follow-up:    Post-discharge interventions: NA  Signature  Rosaline Deretha PEAK

## 2024-11-15 NOTE — Addendum Note (Signed)
 Addended by: ERIK FELTS on: 11/15/2024 08:46 PM   Modules accepted: Orders

## 2024-11-15 NOTE — Telephone Encounter (Signed)
 11/15/2024  Name: Whitney Gonzalez MRN: [id number] DOB: 28-Aug-1991  Reason for Call:  Transition of Care Hospital Discharge Call  Contact Status: Patient Contact Status: Complete Patient left RN a voicemail, returning her call this evening.  Language assistant needed:          Follow-Up Questions: Do You Have Any Concerns About Your Health As You Heal From Delivery?: Yes What Concerns Do You Have About Your Health?: Patient has concerns about her BP being elevated, 140/90's range. She states that she has recently communicated her concerns with her provider and adjustments were made to her BP medication. Patient declines symptoms of preeclamsia currently but states that she was having headaches and changes in her vision. She reports having swelling in her lower extremities. RN told patient to reach out to her provider about elevated BP readings. RN also told patient about [facility] to be assessed. Patient verbalized understanding and states she will communicate with her provider. Patient has no other concerns or questions. Do You Have Any Concerns About Your Infants Health?: No (Patient reports that baby is eating well and doing good.)  Edinburgh Postnatal Depression Scale:  In the Past 7 Days: I have been able to laugh and see the funny side of things.: As much as I always could I have looked forward with enjoyment to things.: As much as I ever did I have blamed myself unnecessarily when things went wrong.: Not very often I have been anxious or worried for no good reason.: Yes, sometimes I have felt scared or panicky for no good reason.: Yes, sometimes Things have been getting on top of me.: No, I have been coping as well as ever I have been so unhappy that I have had difficulty sleeping.: Not at all I have felt sad or miserable.: No, not at all I have been so unhappy that I have been crying.: No, never The thought of harming myself has occurred to me.: Never Edinburgh Postnatal  Depression Scale Total: 5  PHQ2-9 Depression Scale:     Discharge Follow-up: Edinburgh score requires follow up?: No Patient was advised of the following resources:: Breastfeeding Support Group, Support Group Patient referred to:: OB  Post-discharge interventions: Reviewed Newborn Safe Sleep Practices  Signature  Rosaline Deretha PEAK

## 2024-11-17 ENCOUNTER — Encounter

## 2024-11-17 ENCOUNTER — Encounter: Admitting: Obstetrics and Gynecology

## 2024-11-17 ENCOUNTER — Encounter: Admitting: Physical Therapy

## 2024-11-17 ENCOUNTER — Encounter: Admitting: Licensed Clinical Social Worker

## 2024-11-23 ENCOUNTER — Telehealth: Admitting: Physician Assistant

## 2024-11-23 DIAGNOSIS — Z20828 Contact with and (suspected) exposure to other viral communicable diseases: Secondary | ICD-10-CM

## 2024-11-23 MED ORDER — OSELTAMIVIR PHOSPHATE 75 MG PO CAPS: 75.0000 mg | ORAL_CAPSULE | Freq: Every day | ORAL | 0 refills | Status: AC

## 2024-11-23 NOTE — Patient Instructions (Signed)
 " Whitney Gonzalez, thank you for joining Whitney Velma Lunger, PA-C for today's virtual visit.  While this provider is not your primary care provider (PCP), if your PCP is located in our provider database this encounter information will be shared with them immediately following your visit.   A Whitney Gonzalez MyChart account gives you access to today's visit and all your visits, tests, and labs performed at [facility]  click here if you don't have a Whitney Gonzalez MyChart account or go to [email or web address]  Consent: (Patient) Whitney Gonzalez provided verbal consent for this virtual visit at the beginning of the encounter.  Current Medications:  Current Outpatient Medications:    oseltamivir  (TAMIFLU ) 75 MG capsule, Take 1 capsule (75 mg total) by mouth daily for 10 days., Disp: 10 capsule, Rfl: 0   albuterol  (PROVENTIL ) (2.5 MG/3ML) 0.083% nebulizer solution, Take 3 mLs (2.5 mg total) by nebulization every 6 (six) hours as needed for wheezing or shortness of breath., Disp: 75 mL, Rfl: 0   albuterol  (VENTOLIN  HFA) 108 (90 Base) MCG/ACT inhaler, Inhale 1-2 puffs into the lungs every 6 (six) hours as needed for wheezing or shortness of breath., Disp: 1 each, Rfl: 0   aspirin  EC 81 MG tablet, Take 2 tablets (162 mg total) by mouth daily. Swallow whole., Disp: 180 tablet, Rfl: 3   Budesonide  90 MCG/ACT inhaler, Inhale 1 puff into the lungs 2 (two) times daily., Disp: 1 each, Rfl: 2   EPINEPHrine 0.3 mg/0.3 mL IJ SOAJ injection, Inject 0.3 mg into the muscle as needed for anaphylaxis., Disp: , Rfl:    furosemide  (LASIX ) 20 MG tablet, Take 1 tablet (20 mg total) by mouth daily., Disp: 5 tablet, Rfl: 0   ibuprofen  (ADVIL ) 800 MG tablet, Take 1 tablet (800 mg total) by mouth every 6 (six) hours., Disp: 30 tablet, Rfl: 0   levothyroxine  (SYNTHROID ) 25 MCG tablet, TAKE 1 TABLET BY MOUTH DAILY BEFORE BREAKFAST, Disp: 30 tablet, Rfl: 2   montelukast  (SINGULAIR ) 10 MG tablet, Take 1  tablet (10 mg total) by mouth at bedtime., Disp: 30 tablet, Rfl: 4   NIFEdipine  (ADALAT  CC) 30 MG 24 hr tablet, Take 1 tablet (30 mg total) by mouth daily., Disp: 30 tablet, Rfl: 2   NIFEdipine  (ADALAT  CC) 60 MG 24 hr tablet, Take 1 tablet (60 mg total) by mouth in the morning and at bedtime., Disp: 60 tablet, Rfl: 1   potassium chloride  SA (KLOR-CON  M) 20 MEQ tablet, Take 1 tablet (20 mEq total) by mouth daily., Disp: 5 tablet, Rfl: 0   Prenatal Vit-Fe Fumarate-FA (PRENATAL COMPLETE) 14-0.4 MG TABS, Take 1 tablet by mouth daily., Disp: 60 tablet, Rfl: 3   Medications ordered in this encounter:  Meds ordered this encounter  Medications   oseltamivir  (TAMIFLU ) 75 MG capsule    Sig: Take 1 capsule (75 mg total) by mouth daily for 10 days.    Dispense:  10 capsule    Refill:  0    Supervising Provider:   BLAISE ALEENE Gonzalez [[id number]]     *If you need refills on other medications prior to your next appointment, please contact your pharmacy*  Follow-Up: Call back or seek an in-person evaluation if the symptoms worsen or if the condition fails to improve as anticipated.  [facility] [phone]  Other Instructions Oseltamivir  Capsules What is this medication? OSELTAMIVIR  (os el TAM i vir) prevents and treats infections caused by the flu virus (influenza). It works by slowing the spread  of the flu virus in your body and reducing how long your symptoms last. It will not treat colds or infections caused by bacteria or other viruses. It will not replace the annual flu vaccine. This medicine may be used for other purposes; ask your health care provider or pharmacist if you have questions. COMMON BRAND NAME(S): Tamiflu  What should I tell my care team before I take this medication? They need to know if you have any of the following conditions: Difficulty swallowing Kidney disease An unusual or allergic reaction to oseltamivir , other medications, foods, dyes, or  preservatives Pregnant or trying to get pregnant Breast-feeding How should I use this medication? Take this medication by mouth with water. Take it as directed on the prescription label at the same time every day. You can take it with or without food. If it upsets your stomach, take it with food. Take all of this medication unless your care team tells you to stop it early. Keep taking it even if you think you are better. When taking whole capsules: Do not cut, crush, or chew this medication. Swallow the capsules whole. When mixing capsule contents with liquid: Open the capsule and mix the contents in a small bowl with a small amount of a sweetened liquid such as chocolate syrup, corn syrup, caramel topping, or light brown sugar (dissolved in water). Stir the mixture and take the dose right away after mixing. Talk to your care team about the use of this medication in children. While it may be prescribed for selected conditions, precautions do apply. Overdosage: If you think you have taken too much of this medicine contact a poison control center or emergency room at once. NOTE: This medicine is only for you. Do not share this medicine with others. What if I miss a dose? If you miss a dose, take it as soon as you remember. If it is almost time for your next dose (within 2 hours), take only that dose. Do not take double or extra doses. What may interact with this medication? Intranasal influenza vaccine This list may not describe all possible interactions. Give your health care provider a list of all the medicines, herbs, non-prescription drugs, or dietary supplements you use. Also tell them if you smoke, drink alcohol, or use illegal drugs. Some items may interact with your medicine. What should I watch for while using this medication? Visit your care team for regular checks on your progress. Tell your care team if your symptoms do not start to get better or if they get worse. If you have the flu, you  may be at an increased risk of developing seizures, confusion, or abnormal behavior. This occurs early in the illness, and more frequently in children and teens. These events are not common, but may result in accidental injury to the patient. Families and caregivers of patients should watch for signs of unusual behavior and contact a care team right away if the patient shows signs of unusual behavior. To treat the flu, start taking this medication within 2 days of getting flu symptoms. This medication is not a substitute for the flu shot. Talk to your care team each year about an annual flu shot. What side effects may I notice from receiving this medication? Side effects that you should report to your care team as soon as possible: Allergic reactions--skin rash, itching, hives, swelling of the face, lips, tongue, or throat Confusion Hallucinations Redness, blistering, peeling, or loosening of the skin, including inside the mouth Seizures Tremors  or shaking Trouble speaking Unusual changes in behavior Side effects that usually do not require medical attention (report to your care team if they continue or are bothersome): Headache Nausea Vomiting This list may not describe all possible side effects. Call your doctor for medical advice about side effects. You may report side effects to FDA at 1-800-FDA-1088. Where should I keep my medication? Keep out of the reach of children and pets. Store at room temperature between 15 and 30 degrees C (59 and 86 degrees F). Throw away any unused medication after the expiration date. NOTE: This sheet is a summary. It may not cover all possible information. If you have questions about this medicine, talk to your doctor, pharmacist, or health care provider.  2024 Elsevier/Gold Standard (2023-04-25 00:00:00)   If you have been instructed to have an in-person evaluation today at a local Urgent Care facility, please use the link below. It will take you to a list  of all of our available [facility], including address, phone number and hours of operation. Please do not delay care.  [facility]  If you or a family member do not have a primary care provider, use the link below to schedule a visit and establish care. When you choose a [facility] primary care physician or advanced practice provider, you gain a long-term partner in health. Find a Primary Care Provider  Learn more about [facility] in-office and virtual care options: [facility] Get Care Now  "

## 2024-11-23 NOTE — Progress Notes (Signed)
 " Virtual Visit Consent   Whitney Gonzalez, you are scheduled for a virtual visit with a [facility] provider today. Just as with appointments in the office, your consent must be obtained to participate. Your consent will be active for this visit and any virtual visit you may have with one of our providers in the next 365 days. If you have a MyChart account, a copy of this consent can be sent to you electronically.  As this is a virtual visit, video technology does not allow for your provider to perform a traditional examination. This may limit your provider's ability to fully assess your condition. If your provider identifies any concerns that need to be evaluated in person or the need to arrange testing (such as labs, EKG, etc.), we will make arrangements to do so. Although advances in technology are sophisticated, we cannot ensure that it will always work on either your end or our end. If the connection with a video visit is poor, the visit may have to be switched to a telephone visit. With either a video or telephone visit, we are not always able to ensure that we have a secure connection.  By engaging in this virtual visit, you consent to the provision of healthcare and authorize for your insurance to be billed (if applicable) for the services provided during this visit. Depending on your insurance coverage, you may receive a charge related to this service.  I need to obtain your verbal consent now. Are you willing to proceed with your visit today? Paris E Kanno has provided verbal consent on 11/23/2024 for a virtual visit (video or telephone). [name], [place]  Date: 11/23/2024 9:08 AM   Virtual Visit via Video Note   I, [name], connected with  AMARRA SAWYER  ([id number], 12/08/1990) on 11/23/2024 at  9:00 AM EST by a video-enabled telemedicine application and verified that I am speaking with the correct person using two identifiers.  Location: Patient:  Virtual Visit Location Patient: Home Provider: Virtual Visit Location Provider: Home Office   I discussed the limitations of evaluation and management by telemedicine and the availability of in person appointments. The patient expressed understanding and agreed to proceed.    History of Present Illness: Whitney Gonzalez is a 33 y.o. who identifies as a female who was assigned female at birth, and is being seen today for exposure to influenza. Endorses husband with flu like symptoms over past couple of days, testing positive for Flu A yesterday. She has a 32 year old starting to show symptoms and a newborn that is so far without symptoms. She notes some drainage last night but no other symptoms. Feeling fine today.   HPI: HPI  Problems:  Patient Active Problem List   Diagnosis Date Noted   Fetal growth restriction antepartum 11/06/2024   Poor fetal growth affecting management of mother in third trimester 10/07/2024   Anxiety and depression 05/19/2024   Uncomplicated asthma 05/19/2024   History of gestational hypertension 05/19/2024   Supervision of high risk pregnancy, antepartum 05/15/2024   Gastroesophageal reflux disease 04/11/2022   Mass of left side of neck 08/15/2021   De Quervain's tenosynovitis, left 10/27/2019   Arthralgia 08/06/2019   Chronic fatigue 08/06/2019   Hypothyroidism 05/12/2019   Bipolar I disorder (HCC) 05/28/2012    Allergies: Allergies[1] Medications: Current Medications[2]  Observations/Objective: Patient is well-developed, well-nourished in no acute distress.  Resting comfortably at home.  Head is normocephalic, atraumatic.  No labored breathing. Speech  is clear and coherent with logical content.  Patient is alert and oriented at baseline.   Assessment and Plan: 1. Exposure to influenza (Primary) - oseltamivir  (TAMIFLU ) 75 MG capsule; Take 1 capsule (75 mg total) by mouth daily for 10 days.  Dispense: 10 capsule; Refill: 0  Giving need to be  primary caregiver for her family, will start preventive dosing of Tamiflu  once daily for 10 days. Infection prevention precautions reviewed. If symptoms start, she is to switch Tamiflu  to BID dosing and notify us  ASAP.  Follow Up Instructions: I discussed the assessment and treatment plan with the patient. The patient was provided an opportunity to ask questions and all were answered. The patient agreed with the plan and demonstrated an understanding of the instructions.  A copy of instructions were sent to the patient via MyChart unless otherwise noted below.   The patient was advised to call back or seek an in-person evaluation if the symptoms worsen or if the condition fails to improve as anticipated.    [name], PA-C    [1]  Allergies Allergen Reactions   Azithromycin Other (See Comments) and Dermatitis    Steven-Johnsons  Other Reaction(s): Zpak Rash   Banana Anaphylaxis   Cetirizine Nausea Only and Other (See Comments)    Flushing H/A   Codeine Palpitations and Other (See Comments)    Heart rate goes over 200bpm  Other Reaction(s): palpitations, panic attacks  Heart rate goes over 200bpm    Panic Attacks   Doxycycline  Hyclate Shortness Of Breath and Nausea Only   Latex Anaphylaxis, Hives and Rash    Other reaction(s): hives   Mango Flavoring Agent (Non-Screening) Anaphylaxis   Other Anaphylaxis, Other (See Comments), Rash and Swelling    Banana, mango, avocado  Other Reaction(s): Migraines   Propranolol Hcl Shortness Of Breath    Other reaction(s): asthma symptoms worsened   Shellfish Allergy Anaphylaxis    Other reaction(s): throat swelling Scallops only   Cephalexin Rash and Dermatitis   Metronidazole  Hives and Rash   Sulfamethoxazole-Trimethoprim Rash and Dermatitis    Other Reaction(s): rash, past last dose of med   Allegra [Fexofenadine] Hives   Avocado     Other Reaction(s): told to stay away from due to banana/mango/latex allergy    Carbamazepine Other (See Comments)    Other reaction(s): steven's -johnson syndrome  Mannie Louder  Other Reaction(s): steven's -johnson syndrome  Other reaction(s): steven's -johnson syndrome Mannie Louder    Steven's-Johnson Syndrome   Chlorpheniramine     Other Reaction(s): dizzines, flushing h/a, nausea   Estrogens Other (See Comments)    Estrogen related BCP- Migraines  Other Reaction(s): Estrogen based oral Contraceptives - Migraines   Sprintec 28 [Norgestimate-Eth Estradiol ] Nausea And Vomiting   Tobacco Swelling   Amoxicillin -Pot Clavulanate Nausea And Vomiting and Rash    Other reaction(s): rash, mood destabilization  Other Reaction(s): rash, mood destabilization   Penicillins Rash, Dermatitis, Nausea And Vomiting and Other (See Comments)    Has patient had a PCN reaction causing immediate rash, facial/tongue/throat swelling, SOB or lightheadedness with hypotension: unknown  Has patient had a PCN reaction causing severe rash involving mucus membranes or skin necrosis: unknown  Has patient had a PCN reaction that required hospitalization unknown  Has patient had a PCN reaction occurring within the last 10 years: unknown  If all of the above answers are NO, then may proceed with Cephalosporin use.  Other Reaction(s): rash, mood destabilization, rash/nausea  Other reaction(s): rash, mood destabilization  Has patient had a PCN reaction causing immediate rash, facial/tongue/throat swelling, SOB or lightheadedness with hypotension: unknown Has patient had a PCN reaction causing severe rash involving mucus membranes or skin necrosis: unknown Has patient had a PCN reaction that required hospitalization unknown Has patient had a PCN reaction occurring within the last 10 years: unknown If all of the above answers are NO, then may proceed with Cephalosporin use.    Mood Destabilization    Mood destabilization  [2]  Current Outpatient Medications:    oseltamivir   (TAMIFLU ) 75 MG capsule, Take 1 capsule (75 mg total) by mouth daily for 10 days., Disp: 10 capsule, Rfl: 0   albuterol  (PROVENTIL ) (2.5 MG/3ML) 0.083% nebulizer solution, Take 3 mLs (2.5 mg total) by nebulization every 6 (six) hours as needed for wheezing or shortness of breath., Disp: 75 mL, Rfl: 0   albuterol  (VENTOLIN  HFA) 108 (90 Base) MCG/ACT inhaler, Inhale 1-2 puffs into the lungs every 6 (six) hours as needed for wheezing or shortness of breath., Disp: 1 each, Rfl: 0   aspirin  EC 81 MG tablet, Take 2 tablets (162 mg total) by mouth daily. Swallow whole., Disp: 180 tablet, Rfl: 3   Budesonide  90 MCG/ACT inhaler, Inhale 1 puff into the lungs 2 (two) times daily., Disp: 1 each, Rfl: 2   EPINEPHrine 0.3 mg/0.3 mL IJ SOAJ injection, Inject 0.3 mg into the muscle as needed for anaphylaxis., Disp: , Rfl:    furosemide  (LASIX ) 20 MG tablet, Take 1 tablet (20 mg total) by mouth daily., Disp: 5 tablet, Rfl: 0   ibuprofen  (ADVIL ) 800 MG tablet, Take 1 tablet (800 mg total) by mouth every 6 (six) hours., Disp: 30 tablet, Rfl: 0   levothyroxine  (SYNTHROID ) 25 MCG tablet, TAKE 1 TABLET BY MOUTH DAILY BEFORE BREAKFAST, Disp: 30 tablet, Rfl: 2   montelukast  (SINGULAIR ) 10 MG tablet, Take 1 tablet (10 mg total) by mouth at bedtime., Disp: 30 tablet, Rfl: 4   NIFEdipine  (ADALAT  CC) 30 MG 24 hr tablet, Take 1 tablet (30 mg total) by mouth daily., Disp: 30 tablet, Rfl: 2   NIFEdipine  (ADALAT  CC) 60 MG 24 hr tablet, Take 1 tablet (60 mg total) by mouth in the morning and at bedtime., Disp: 60 tablet, Rfl: 1   potassium chloride  SA (KLOR-CON  M) 20 MEQ tablet, Take 1 tablet (20 mEq total) by mouth daily., Disp: 5 tablet, Rfl: 0   Prenatal Vit-Fe Fumarate-FA (PRENATAL COMPLETE) 14-0.4 MG TABS, Take 1 tablet by mouth daily., Disp: 60 tablet, Rfl: 3  "

## 2024-11-24 ENCOUNTER — Encounter: Admitting: Family Medicine

## 2024-11-24 NOTE — Addendum Note (Signed)
 Addended by: ERIK FELTS on: 11/24/2024 05:46 PM   Modules accepted: Orders

## 2024-11-27 ENCOUNTER — Telehealth

## 2024-11-27 DIAGNOSIS — N61 Mastitis without abscess: Secondary | ICD-10-CM

## 2024-11-27 MED ORDER — CLINDAMYCIN HCL 300 MG PO CAPS: 300.0000 mg | ORAL_CAPSULE | Freq: Three times a day (TID) | ORAL | 0 refills | Status: AC

## 2024-11-27 NOTE — Progress Notes (Signed)
 " Virtual Visit Consent   Whitney Gonzalez, you are scheduled for a virtual visit with a [place] provider today. Just as with appointments in the office, your consent must be obtained to participate. Your consent will be active for this visit and any virtual visit you may have with one of our providers in the next 365 days. If you have a MyChart account, a copy of this consent can be sent to you electronically.  As this is a virtual visit, video technology does not allow for your provider to perform a traditional examination. This may limit your provider's ability to fully assess your condition. If your provider identifies any concerns that need to be evaluated in person or the need to arrange testing (such as labs, EKG, etc.), we will make arrangements to do so. Although advances in technology are sophisticated, we cannot ensure that it will always work on either your end or our end. If the connection with a video visit is poor, the visit may have to be switched to a telephone visit. With either a video or telephone visit, we are not always able to ensure that we have a secure connection.  By engaging in this virtual visit, you consent to the provision of healthcare and authorize for your insurance to be billed (if applicable) for the services provided during this visit. Depending on your insurance coverage, you may receive a charge related to this service.  I need to obtain your verbal consent now. Are you willing to proceed with your visit today? Whitney Gonzalez has provided verbal consent on 11/27/2024 for a virtual visit (video or telephone). Whitney CHRISTELLA Dickinson, PA-C  Date: 11/27/2024 10:30 AM   Virtual Visit via Video Note   I, Whitney Gonzalez, connected with  Whitney Gonzalez  ([id number], 1991/08/13) on 11/27/2024 at 10:15 AM EST by a video-enabled telemedicine application and verified that I am speaking with the correct person using two  identifiers.  Location: Patient: Virtual Visit Location Patient: Home Provider: Virtual Visit Location Provider: Home Office   I discussed the limitations of evaluation and management by telemedicine and the availability of in person appointments. The patient expressed understanding and agreed to proceed.    History of Present Illness: Whitney Gonzalez is a 33 y.o. who identifies as a female who was assigned female at birth, and is being seen today for mastitis.  Symptoms started Tuesday, 11/25/24 with knots and burning. Then worsened yesterday. Having fevers, highest 101. Breast are engorged and swollen. Very tender. Having general malaise.   Icing, tylenol , and Ibuprofen  all used without relief.   Problems:  Patient Active Problem List   Diagnosis Date Noted   Fetal growth restriction antepartum 11/06/2024   Poor fetal growth affecting management of mother in third trimester 10/07/2024   Anxiety and depression 05/19/2024   Uncomplicated asthma 05/19/2024   History of gestational hypertension 05/19/2024   Supervision of high risk pregnancy, antepartum 05/15/2024   Gastroesophageal reflux disease 04/11/2022   Mass of left side of neck 08/15/2021   De Quervain's tenosynovitis, left 10/27/2019   Arthralgia 08/06/2019   Chronic fatigue 08/06/2019   Hypothyroidism 05/12/2019   Bipolar I disorder (HCC) 05/28/2012    Allergies: Allergies[1] Medications: Current Medications[2]  Observations/Objective: Patient is well-developed, well-nourished in no acute distress.  Resting comfortably at home.  Head is normocephalic, atraumatic.  No labored breathing.  Speech is clear and coherent with logical content.  Patient is alert and oriented at baseline.  Assessment and Plan: 1. Mastitis (Primary) - clindamycin  (CLEOCIN ) 300 MG capsule; Take 1 capsule (300 mg total) by mouth 3 (three) times daily for 10 days.  Dispense: 30 capsule; Refill: 0  - Multiple allergies noted -  Clindamycin  prescribed as she has tolerated this in the past - Continue supportive care - Continue massage - Push fluids - Seek in person evaluation if worsening or fails to improve  Follow Up Instructions: I discussed the assessment and treatment plan with the patient. The patient was provided an opportunity to ask questions and all were answered. The patient agreed with the plan and demonstrated an understanding of the instructions.  A copy of instructions were sent to the patient via MyChart unless otherwise noted below.    The patient was advised to call back or seek an in-person evaluation if the symptoms worsen or if the condition fails to improve as anticipated.    Whitney CHRISTELLA Dickinson, PA-C     [1]  Allergies Allergen Reactions   Azithromycin Other (See Comments) and Dermatitis    Steven-Johnsons  Other Reaction(s): Zpak Rash   Banana Anaphylaxis   Cetirizine Nausea Only and Other (See Comments)    Flushing H/A   Codeine Palpitations and Other (See Comments)    Heart rate goes over 200bpm  Other Reaction(s): palpitations, panic attacks  Heart rate goes over 200bpm    Panic Attacks   Doxycycline  Hyclate Shortness Of Breath and Nausea Only   Latex Anaphylaxis, Hives and Rash    Other reaction(s): hives   Mango Flavoring Agent (Non-Screening) Anaphylaxis   Other Anaphylaxis, Other (See Comments), Rash and Swelling    Banana, mango, avocado  Other Reaction(s): Migraines   Propranolol Hcl Shortness Of Breath    Other reaction(s): asthma symptoms worsened   Shellfish Allergy Anaphylaxis    Other reaction(s): throat swelling Scallops only   Cephalexin Rash and Dermatitis   Metronidazole  Hives and Rash   Sulfamethoxazole-Trimethoprim Rash and Dermatitis    Other Reaction(s): rash, past last dose of med   Allegra [Fexofenadine] Hives   Avocado     Other Reaction(s): told to stay away from due to banana/mango/latex allergy   Carbamazepine Other (See Comments)     Other reaction(s): steven's -johnson syndrome  Mannie Louder  Other Reaction(s): steven's -johnson syndrome  Other reaction(s): steven's -johnson syndrome Mannie Louder    Steven's-Johnson Syndrome   Chlorpheniramine     Other Reaction(s): dizzines, flushing h/a, nausea   Estrogens Other (See Comments)    Estrogen related BCP- Migraines  Other Reaction(s): Estrogen based oral Contraceptives - Migraines   Sprintec 28 [Norgestimate-Eth Estradiol ] Nausea And Vomiting   Tobacco Swelling   Amoxicillin -Pot Clavulanate Nausea And Vomiting and Rash    Other reaction(s): rash, mood destabilization  Other Reaction(s): rash, mood destabilization   Penicillins Rash, Dermatitis, Nausea And Vomiting and Other (See Comments)    Has patient had a PCN reaction causing immediate rash, facial/tongue/throat swelling, SOB or lightheadedness with hypotension: unknown  Has patient had a PCN reaction causing severe rash involving mucus membranes or skin necrosis: unknown  Has patient had a PCN reaction that required hospitalization unknown  Has patient had a PCN reaction occurring within the last 10 years: unknown  If all of the above answers are NO, then may proceed with Cephalosporin use.  Other Reaction(s): rash, mood destabilization, rash/nausea  Other reaction(s): rash, mood destabilization    Has patient had a PCN reaction causing immediate rash, facial/tongue/throat swelling, SOB or lightheadedness with hypotension: unknown  Has patient had a PCN reaction causing severe rash involving mucus membranes or skin necrosis: unknown Has patient had a PCN reaction that required hospitalization unknown Has patient had a PCN reaction occurring within the last 10 years: unknown If all of the above answers are NO, then may proceed with Cephalosporin use.    Mood Destabilization    Mood destabilization  [2]  Current Outpatient Medications:    clindamycin  (CLEOCIN ) 300 MG capsule, Take 1 capsule  (300 mg total) by mouth 3 (three) times daily for 10 days., Disp: 30 capsule, Rfl: 0   albuterol  (PROVENTIL ) (2.5 MG/3ML) 0.083% nebulizer solution, Take 3 mLs (2.5 mg total) by nebulization every 6 (six) hours as needed for wheezing or shortness of breath., Disp: 75 mL, Rfl: 0   albuterol  (VENTOLIN  HFA) 108 (90 Base) MCG/ACT inhaler, Inhale 1-2 puffs into the lungs every 6 (six) hours as needed for wheezing or shortness of breath., Disp: 1 each, Rfl: 0   aspirin  EC 81 MG tablet, Take 2 tablets (162 mg total) by mouth daily. Swallow whole., Disp: 180 tablet, Rfl: 3   Budesonide  90 MCG/ACT inhaler, Inhale 1 puff into the lungs 2 (two) times daily., Disp: 1 each, Rfl: 2   EPINEPHrine 0.3 mg/0.3 mL IJ SOAJ injection, Inject 0.3 mg into the muscle as needed for anaphylaxis., Disp: , Rfl:    furosemide  (LASIX ) 20 MG tablet, Take 1 tablet (20 mg total) by mouth daily., Disp: 5 tablet, Rfl: 0   ibuprofen  (ADVIL ) 800 MG tablet, Take 1 tablet (800 mg total) by mouth every 6 (six) hours., Disp: 30 tablet, Rfl: 0   levothyroxine  (SYNTHROID ) 25 MCG tablet, TAKE 1 TABLET BY MOUTH DAILY BEFORE BREAKFAST, Disp: 30 tablet, Rfl: 2   montelukast  (SINGULAIR ) 10 MG tablet, Take 1 tablet (10 mg total) by mouth at bedtime., Disp: 30 tablet, Rfl: 4   NIFEdipine  (ADALAT  CC) 30 MG 24 hr tablet, Take 1 tablet (30 mg total) by mouth daily., Disp: 30 tablet, Rfl: 2   NIFEdipine  (ADALAT  CC) 60 MG 24 hr tablet, Take 1 tablet (60 mg total) by mouth in the morning and at bedtime., Disp: 60 tablet, Rfl: 1   oseltamivir  (TAMIFLU ) 75 MG capsule, Take 1 capsule (75 mg total) by mouth daily for 10 days., Disp: 10 capsule, Rfl: 0   potassium chloride  SA (KLOR-CON  M) 20 MEQ tablet, Take 1 tablet (20 mEq total) by mouth daily., Disp: 5 tablet, Rfl: 0   Prenatal Vit-Fe Fumarate-FA (PRENATAL COMPLETE) 14-0.4 MG TABS, Take 1 tablet by mouth daily., Disp: 60 tablet, Rfl: 3  "

## 2024-11-27 NOTE — Patient Instructions (Signed)
 " [facility] FOR[facility], thank you for joining Delon CHRISTELLA Dickinson, PA-C for today's virtual visit.  While this provider is not your primary care provider (PCP), if your PCP is located in our provider database this encounter information will be shared with them immediately following your visit.   A [facility] MyChart account gives you access to today's visit and all your visits, tests, and labs performed at [facility]  click here if you don't have a  MyChart account or go to [email or web address]  Consent: (Patient) Whitney Gonzalez provided verbal consent for this virtual visit at the beginning of the encounter.  Current Medications:  Current Outpatient Medications:    clindamycin  (CLEOCIN ) 300 MG capsule, Take 1 capsule (300 mg total) by mouth 3 (three) times daily for 10 days., Disp: 30 capsule, Rfl: 0   albuterol  (PROVENTIL ) (2.5 MG/3ML) 0.083% nebulizer solution, Take 3 mLs (2.5 mg total) by nebulization every 6 (six) hours as needed for wheezing or shortness of breath., Disp: 75 mL, Rfl: 0   albuterol  (VENTOLIN  HFA) 108 (90 Base) MCG/ACT inhaler, Inhale 1-2 puffs into the lungs every 6 (six) hours as needed for wheezing or shortness of breath., Disp: 1 each, Rfl: 0   aspirin  EC 81 MG tablet, Take 2 tablets (162 mg total) by mouth daily. Swallow whole., Disp: 180 tablet, Rfl: 3   Budesonide  90 MCG/ACT inhaler, Inhale 1 puff into the lungs 2 (two) times daily., Disp: 1 each, Rfl: 2   EPINEPHrine 0.3 mg/0.3 mL IJ SOAJ injection, Inject 0.3 mg into the muscle as needed for anaphylaxis., Disp: , Rfl:    furosemide  (LASIX ) 20 MG tablet, Take 1 tablet (20 mg total) by mouth daily., Disp: 5 tablet, Rfl: 0   ibuprofen  (ADVIL ) 800 MG tablet, Take 1 tablet (800 mg total) by mouth every 6 (six) hours., Disp: 30 tablet, Rfl: 0   levothyroxine  (SYNTHROID ) 25 MCG tablet, TAKE 1 TABLET BY MOUTH DAILY BEFORE BREAKFAST, Disp: 30 tablet, Rfl: 2   montelukast  (SINGULAIR ) 10 MG  tablet, Take 1 tablet (10 mg total) by mouth at bedtime., Disp: 30 tablet, Rfl: 4   NIFEdipine  (ADALAT  CC) 30 MG 24 hr tablet, Take 1 tablet (30 mg total) by mouth daily., Disp: 30 tablet, Rfl: 2   NIFEdipine  (ADALAT  CC) 60 MG 24 hr tablet, Take 1 tablet (60 mg total) by mouth in the morning and at bedtime., Disp: 60 tablet, Rfl: 1   oseltamivir  (TAMIFLU ) 75 MG capsule, Take 1 capsule (75 mg total) by mouth daily for 10 days., Disp: 10 capsule, Rfl: 0   potassium chloride  SA (KLOR-CON  M) 20 MEQ tablet, Take 1 tablet (20 mEq total) by mouth daily., Disp: 5 tablet, Rfl: 0   Prenatal Vit-Fe Fumarate-FA (PRENATAL COMPLETE) 14-0.4 MG TABS, Take 1 tablet by mouth daily., Disp: 60 tablet, Rfl: 3   Medications ordered in this encounter:  Meds ordered this encounter  Medications   clindamycin  (CLEOCIN ) 300 MG capsule    Sig: Take 1 capsule (300 mg total) by mouth 3 (three) times daily for 10 days.    Dispense:  30 capsule    Refill:  0    Supervising Provider:   BLAISE ALEENE KIDD [[id number]]     *If you need refills on other medications prior to your next appointment, please contact your pharmacy*  Follow-Up: Call back or seek an in-person evaluation if the symptoms worsen or if the condition fails to improve as anticipated.  [facility] [phone]  Other Instructions Mastitis  Mastitis is inflammation of the breast tissue. It most often happens in females who are breastfeeding. But it can happen to anyone, including males. It will sometimes go away on its own. Other times, mastitis may need treatment. What are the causes? In people who are not lactating, mastitis is usually caused by bacteria that gets into the breast tissue through cuts, cracks, or openings in the skin. Sometimes, mastitis can happen when there are no cuts or openings in the skin. Other causes include: Nipple piercing. Some forms of breast cancer. What are the signs or symptoms? Symptoms of this condition  include: Swelling, redness, tenderness, and pain in the breast. The area may also feel warm. Swelling of the glands under the arm. Nipple discharge. Tiredness (fatigue), headache, and flu-like muscle aches. Fever and chills. Nausea and vomiting. Symptoms can last 2-5 days. Breast pain and redness are at their worst on days 2 and 3, but can go away by day 5. If an infection develops and is not treated, a collection of pus (abscess) may form. How is this diagnosed? Mastitis is often diagnosed based on a physical exam and your symptoms. You may have other tests, such as: Blood tests. Fluid tests. Fluid is removed from the abscess to check for bacteria. Mammogram or ultrasound tests. How is this treated? Treatment may include: Using hot or cold compresses on the affected area. Pain medicine. Antibiotic or steroid medicines. Self-care, such as rest and drinking more fluids. Removing fluid from an abscess, if one has formed. Follow these instructions at home: Breast care  Keep your nipples clean and dry. If told, put ice on the affected area. Put ice in a plastic bag. Place a towel between your skin and the bag. Leave the ice on for 20 minutes, 2-3 times a day. If told, apply heat to the affected area as often as told by your health care provider. Use the heat source that your provider recommends, such as a moist heat pack or a heating pad. Place a towel between your skin and the heat source. Leave the heat on for 20-30 minutes. If your skin turns bright red, remove the ice or heat right away to prevent skin damage. The risk of damage is higher if you cannot feel pain, heat, or cold. Medicines Take over-the-counter and prescription medicines only as told by your provider. If you were prescribed antibiotics, take them as told by your provider. Do not stop using the antibiotic even if you start to feel better. Contact a health care provider if: You have pus-like discharge coming from the  nipple. You have a fever. Your pain and swelling get worse. Your symptoms do not start to get better within 2 days of starting treatment. Your pain is not helped by medicine. Get help right away if: You have a red line going from your breast toward your armpit. This information is not intended to replace advice given to you by your health care provider. Make sure you discuss any questions you have with your health care provider. Document Revised: 09/21/2022 Document Reviewed: 09/21/2022 Elsevier Patient Education  2024 Elsevier Inc.   If you have been instructed to have an in-person evaluation today at a local Urgent Care facility, please use the link below. It will take you to a list of all of our available [facility], including address, phone number and hours of operation. Please do not delay care.  [facility]  If you or a  family member do not have a primary care provider, use the link below to schedule a visit and establish care. When you choose a [facility] primary care physician or advanced practice provider, you gain a long-term partner in health. Find a Primary Care Provider  Learn more about [facility]'s in-office and virtual care options: [facility] - Get Care Now "

## 2024-12-08 ENCOUNTER — Encounter: Payer: Self-pay | Admitting: Lactation Services

## 2024-12-09 ENCOUNTER — Other Ambulatory Visit: Payer: Self-pay | Admitting: Obstetrics and Gynecology

## 2024-12-10 ENCOUNTER — Ambulatory Visit: Admitting: Family Medicine

## 2024-12-10 ENCOUNTER — Other Ambulatory Visit: Payer: Self-pay

## 2024-12-10 ENCOUNTER — Encounter: Payer: Self-pay | Admitting: Family Medicine

## 2024-12-10 VITALS — BP 122/80 | HR 78 | Ht 60.0 in | Wt 143.0 lb

## 2024-12-10 DIAGNOSIS — M25532 Pain in left wrist: Secondary | ICD-10-CM

## 2024-12-10 DIAGNOSIS — M2550 Pain in unspecified joint: Secondary | ICD-10-CM

## 2024-12-10 DIAGNOSIS — G5603 Carpal tunnel syndrome, bilateral upper limbs: Secondary | ICD-10-CM

## 2024-12-10 LAB — SEDIMENTATION RATE: Sed Rate: 28 mm/h — ABNORMAL HIGH (ref 0–20)

## 2024-12-10 LAB — T4, FREE: Free T4: 0.7 ng/dL (ref 0.60–1.60)

## 2024-12-10 LAB — T3, FREE: T3, Free: 3.7 pg/mL (ref 2.3–4.2)

## 2024-12-10 LAB — TSH: TSH: 1.39 u[IU]/mL (ref 0.35–5.50)

## 2024-12-10 NOTE — Patient Instructions (Addendum)
 Thank you for coming in today.   You received an injection today. Seek immediate medical attention if the joint becomes red, extremely painful, or is oozing fluid.   Please get labs today before you leave   Schedule appointment for evaluation of hypermobility   Check out the book Disjointed Navigating the Diagnosis and Management of Hypermobile Ehlers-Danlos Syndrome and Hypermobility Spectrum Disorders.     For POTS symptoms: Consume 3 L water and 8-12 gram of salt per day    Check out these websites for exercises to try if you have POTS (CHOP Protocol, [place] Protocol, and [employer] Protocol) [email or web address]    Guide to Prof. 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

## 2024-12-10 NOTE — Progress Notes (Signed)
 "        I, Leotis Batter, CMA acting as a scribe for Artist Lloyd, MD.  Whitney Gonzalez is a 34 y.o. female who presents to [facility] today for bilat wrist pain which started during the last trimester of pregnancy, 1 month post-partum. Also c/o left 2nd digit trigger finger with pain and swelling.   Radiates: hand, wrist, fingers Paresthesia: bilat hands Grip strength: decreased at times Aggravates: laying in bed Treatments tried: heat, splinting, IBU 800 mg, Asper Cream  Recommended pt schedule eval for hEDS.   Pertinent review of systems: No fevers or chills  Relevant historical information: History of left de Quervain's tenosynovitis following first pregnancy.  This ultimately required surgery.  Hypothyroidism  Exam:  BP 122/80   Pulse 78   Ht 5' (1.524 m)   Wt 143 lb (64.9 kg)   LMP 02/16/2024   SpO2 99%   BMI 27.93 kg/m  General: Well Developed, well nourished, and in no acute distress.   MSK: Bilateral hand swelling present.  No triggering present with flexion of left index finger but some stiffness is noted.  Bilateral wrist normal-appearing positive Tinel's and Phalen's test bilaterally.    Lab and Radiology Results  Carpal tunnel median nerve hydrodissection Procedure: Real-time Ultrasound Guided median nerve hydrodissection and carpal tunnel injection right Device: Philips Affiniti 50G/GE Logiq Images permanently stored and available for review in PACS Verbal informed consent obtained.  Discussed risks and benefits of procedure. Warned about infection, bleeding, hyperglycemia damage to structures among others. Patient expresses understanding and agreement Time-out conducted.   Noted no overlying erythema, induration, or other signs of local infection.   Skin prepped in a sterile fashion.   Local anesthesia: Topical Ethyl chloride.   With sterile technique and under real time ultrasound guidance: 40 mg of Kenalog  and 1 mL of  lidocaine  injected into carpal tunnel around the median nerve. Fluid seen entering the carpal tunnel.   Completed without difficulty   Pain immediately resolved suggesting accurate placement of the medication.   Advised to call if fevers/chills, erythema, induration, drainage, or persistent bleeding.   Images permanently stored and available for review in the ultrasound unit.  Impression: Technically successful ultrasound guided injection.  Carpal tunnel median nerve hydrodissection Procedure: Real-time Ultrasound Guided median nerve hydrodissection and carpal tunnel injection left Device: Philips Affiniti 50G/GE Logiq Images permanently stored and available for review in PACS Verbal informed consent obtained.  Discussed risks and benefits of procedure. Warned about infection, bleeding, hyperglycemia damage to structures among others. Patient expresses understanding and agreement Time-out conducted.   Noted no overlying erythema, induration, or other signs of local infection.   Skin prepped in a sterile fashion.   Local anesthesia: Topical Ethyl chloride.   With sterile technique and under real time ultrasound guidance: 40 mg of Kenalog  and 1 mL of lidocaine  injected into carpal tunnel around the median nerve. Fluid seen entering the carpal tunnel.   Completed without difficulty   Pain immediately resolved suggesting accurate placement of the medication.   Advised to call if fevers/chills, erythema, induration, drainage, or persistent bleeding.   Images permanently stored and available for review in the ultrasound unit.  Impression: Technically successful ultrasound guided injection.       Assessment and Plan: 34 y.o. female with bilateral carpal tunnel syndrome.  Plan for injection today.  Additionally she notes hand swelling and discomfort.  This occurred after pregnancy could be related to hypothyroidism or rheumatologic condition  orthopedic condition.  Plan for rheumatologic  workup as well as checking thyroid .  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  Will check back in about a month to formalize this diagnosis.   PDMP not reviewed this encounter. Orders Placed This Encounter  Procedures   US  LIMITED JOINT SPACE STRUCTURES UP BILAT(NO LINKED CHARGES)    Reason for Exam (SYMPTOM  OR DIAGNOSIS REQUIRED):   bilat wrist pain    Preferred imaging location?:   [facility]   ANA    Standing Status:   Future    Number of Occurrences:   1    Expiration Date:   12/10/2025   Cyclic citrul peptide antibody, IgG    Standing Status:   Future    Number of Occurrences:   1    Expiration Date:   12/10/2025   HLA-B27 antigen    Polyarthalgia    Standing Status:   Future    Number of Occurrences:   1    Expiration Date:   12/10/2025   Rheumatoid factor    Standing Status:   Future    Number of Occurrences:   1    Expiration Date:   12/10/2025   Sedimentation rate    Standing Status:   Future    Number of Occurrences:   1    Expiration Date:   12/10/2025   TSH    Standing Status:   Future    Number of Occurrences:   1    Expiration Date:   12/10/2025   T3, free    Standing Status:   Future    Number of Occurrences:   1    Expiration Date:   12/10/2025   T4, free    Standing Status:   Future    Number of Occurrences:   1    Expiration Date:   12/10/2025   No orders of the defined types were placed in this encounter.    Discussed warning signs or symptoms. Please see discharge instructions. Patient expresses understanding.   The above documentation has been reviewed and is accurate and complete Artist Lloyd, M.D.   "

## 2024-12-11 ENCOUNTER — Ambulatory Visit: Payer: Self-pay | Admitting: Family Medicine

## 2024-12-11 LAB — RHEUMATOID FACTOR: Rheumatoid fact SerPl-aCnc: <10 [IU]/mL

## 2024-12-11 LAB — ANA: Anti Nuclear Antibody (ANA): NEGATIVE

## 2024-12-11 LAB — CYCLIC CITRUL PEPTIDE ANTIBODY, IGG: Cyclic Citrullin Peptide Ab: <16 U

## 2024-12-11 LAB — HLA-B27 ANTIGEN: HLA-B27 Antigen: NEGATIVE

## 2024-12-11 NOTE — Progress Notes (Signed)
 Thyroid  labs look okay.  Sedimentation rate a marker of general inflammation is mildly elevated.  Other labs are still pending.

## 2024-12-12 NOTE — Progress Notes (Signed)
 Remaining rheumatologic labs are normal.

## 2024-12-17 ENCOUNTER — Encounter: Payer: Self-pay | Admitting: Cardiology

## 2024-12-17 ENCOUNTER — Ambulatory Visit: Attending: Cardiology | Admitting: Cardiology

## 2024-12-17 VITALS — BP 108/76 | HR 76 | Ht 60.0 in | Wt 141.6 lb

## 2024-12-17 DIAGNOSIS — R002 Palpitations: Secondary | ICD-10-CM | POA: Insufficient documentation

## 2024-12-17 NOTE — Patient Instructions (Signed)
 Medication Instructions:  Your physician recommends that you continue on your current medications as directed. Please refer to the Current Medication list given to you today.  *If you need a refill on your cardiac medications before your next appointment, please call your pharmacy*  Follow-Up: At [facility], you and your health needs are our priority.  As part of our continuing mission to provide you with exceptional heart care, our providers are all part of one team.  This team includes your primary Cardiologist (physician) and Advanced Practice Providers or APPs (Physician Assistants and Nurse Practitioners) who all work together to provide you with the care you need, when you need it.  Your next appointment:    AS needed  Provider:   Kardie Tobb, DO   Other Instructions:
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
## 2024-12-18 ENCOUNTER — Ambulatory Visit: Admitting: Obstetrics and Gynecology

## 2024-12-18 ENCOUNTER — Other Ambulatory Visit (HOSPITAL_COMMUNITY)
Admission: RE | Admit: 2024-12-18 | Discharge: 2024-12-18 | Disposition: A | Source: Ambulatory Visit | Attending: Obstetrics and Gynecology | Admitting: Obstetrics and Gynecology

## 2024-12-18 ENCOUNTER — Other Ambulatory Visit: Payer: Self-pay

## 2024-12-18 DIAGNOSIS — N898 Other specified noninflammatory disorders of vagina: Secondary | ICD-10-CM | POA: Insufficient documentation

## 2024-12-18 DIAGNOSIS — N6323 Unspecified lump in the left breast, lower outer quadrant: Secondary | ICD-10-CM | POA: Diagnosis not present

## 2024-12-18 LAB — POCT PREGNANCY, URINE: Preg Test, Ur: NEGATIVE

## 2024-12-18 NOTE — Progress Notes (Signed)
 "   Post Partum Visit Note  Whitney Gonzalez is a 34 y.o. [phone] female who presents for a postpartum visit. She is 5.6 weeks postpartum following a normal spontaneous vaginal delivery.  I have fully reviewed the prenatal and intrapartum course. The delivery was at 37.6 gestational weeks.  Anesthesia: epidural. Postpartum course has been well. Baby is doing well. Baby is feeding by breast. Bleeding moderate lochia. Bowel function is normal. Bladder function is normal. Patient is sexually active. Contraception method is none. Postpartum depression screening: negative.   Upstream - 12/18/24 1542       Pregnancy Intention Screening   Does the patient want to become pregnant in the next year? Unsure    Does the patient's partner want to become pregnant in the next year? Unsure    Would the patient like to discuss contraceptive options today? No         The pregnancy intention screening data noted above was reviewed. Potential methods of contraception were discussed. The patient elected to proceed with no option.   Edinburgh Postnatal Depression Scale - 12/18/24 1540       Edinburgh Postnatal Depression Scale:  In the Past 7 Days   I have been able to laugh and see the funny side of things. 0    I have looked forward with enjoyment to things. 0    I have blamed myself unnecessarily when things went wrong. 0    I have been anxious or worried for no good reason. 1    I have felt scared or panicky for no good reason. 0    Things have been getting on top of me. 1    I have been so unhappy that I have had difficulty sleeping. 0    I have felt sad or miserable. 0    I have been so unhappy that I have been crying. 0    The thought of harming myself has occurred to me. 0    Edinburgh Postnatal Depression Scale Total 2          Health Maintenance Due  Topic Date Due   Pneumococcal Vaccine (1 of 2 - PCV) Never done   DTaP/Tdap/Td (10 - Td or Tdap) 04/28/2019   Cervical Cancer  Screening (HPV/Pap Cotest)  05/31/2024   Influenza Vaccine  07/04/2024   COVID-19 Vaccine (1 - 2025-26 season) Never done    The following portions of the patient's history were reviewed and updated as appropriate: allergies, current medications, past family history, past medical history, past social history, past surgical history, and problem list.  Review of Systems Pertinent items are noted in HPI.  Objective:  BP 120/85   Pulse 97   Wt 142 lb 4.8 oz (64.5 kg)   LMP 12/14/2024 (Exact Date)   Breastfeeding Yes   BMI 27.79 kg/m    General:  alert, cooperative, and no distress   Breasts:  Normal but possible palpable mass left breast at 4 o'clock, likely breast duct/gland  Lungs: clear to auscultation bilaterally  Heart:  regular rate and rhythm  Abdomen: soft, non-tender; bowel sounds normal; no masses,  no organomegaly   Wound N/a  GU exam:  normal     Pap taken , 1st degree tear healed  Assessment:    Encounter for postpartum care  normal postpartum exam.   Plan:   Essential components of care per ACOG recommendations:  1.  Mood and well being: Patient with negative depression screening today. Reviewed local resources for  support.  - Patient tobacco use? No.   - hx of drug use? No.    2. Infant care and feeding:  -Patient currently breastmilk feeding? Yes. Reviewed importance of draining breast regularly to support lactation.  -Social determinants of health (SDOH) reviewed in EPIC. No concerns.  3. Sexuality, contraception and birth spacing - Patient does not know if she wants a pregnancy in the next year.  Desired family size is 4 children.  - Reviewed reproductive life planning. Reviewed contraceptive methods based on pt preferences and effectiveness.  Patient desired No Method - Other Reason today.   - Discussed birth spacing of 18 months  4. Sleep and fatigue -Encouraged family/partner/community support of 4 hrs of uninterrupted sleep to help with mood and  fatigue  5. Physical Recovery  - Discussed patients delivery and complications. She describes her labor as good. - Patient had a Vaginal, no problems at delivery. Patient had a 1st degree laceration. Perineal healing reviewed. Patient expressed understanding - Patient has urinary incontinence? No. - Patient is safe to resume physical and sexual activity  6.  Health Maintenance - HM due items addressed Yes - Last pap smear  Diagnosis  Date Value Ref Range Status  05/31/2021   Final   - Negative for intraepithelial lesion or malignancy (NILM)   Pap smear done at today's visit.  -Breast Cancer screening indicated? No.   7. Chronic Disease/Pregnancy Condition follow up: None  - PCP follow up  Breast ultrasound ordered per lactation and patient request Vaginal swab taken, if positive for BV, pt desires vaginal treatment not oral, pt has multiple allergies, clindamycin  may be best tolerated.  Jerilynn DELENA Buddle, MD [facility] [employer], [facility]  "

## 2024-12-19 LAB — CERVICOVAGINAL ANCILLARY ONLY
Bacterial Vaginitis (gardnerella): NEGATIVE
Candida Glabrata: NEGATIVE
Candida Vaginitis: NEGATIVE
Comment: NEGATIVE
Comment: NEGATIVE
Comment: NEGATIVE
Comment: NEGATIVE
Trichomonas: NEGATIVE

## 2024-12-22 ENCOUNTER — Ambulatory Visit: Payer: Self-pay | Admitting: Obstetrics and Gynecology

## 2024-12-24 LAB — CYTOLOGY - PAP
Chlamydia: NEGATIVE
Comment: NEGATIVE
Comment: NEGATIVE
Comment: NORMAL
Diagnosis: NEGATIVE
High risk HPV: POSITIVE — AB
Neisseria Gonorrhea: NEGATIVE

## 2024-12-25 ENCOUNTER — Telehealth: Payer: Self-pay | Admitting: Family Medicine

## 2024-12-25 NOTE — Telephone Encounter (Signed)
 Pt says she seen test results in mychart that says she had an abnormal pap. She says she has not gotten a call about these results and would like to speak to someone about it and also wants to know what type of follow up appointment she will need.

## 2024-12-25 NOTE — Telephone Encounter (Signed)
Response sent to pt via Mychart.

## 2024-12-30 ENCOUNTER — Other Ambulatory Visit: Payer: Self-pay | Admitting: Family Medicine

## 2024-12-30 NOTE — Telephone Encounter (Addendum)
 Advised patient of HPV on pap smear and provider's recommendation of colpo.  Patient familiar with procedure and verbalized understanding, I advised her that admin staff will reach out to schedule appointment for procedure.     [place], RN    ----- Message from Jerilynn Buddle, MD sent at 12/30/2024 10:25 AM EST ----- Pap normal, HPV positive, recommend colposcopy

## 2024-12-31 ENCOUNTER — Telehealth: Payer: Self-pay | Admitting: Family Medicine

## 2024-12-31 NOTE — Telephone Encounter (Signed)
 Called pt and LVM letting her know I have scheduled her for our next available procedure appt. Left office number for pt if she needs to reschedule for a later day in March.

## 2025-01-07 ENCOUNTER — Other Ambulatory Visit: Payer: Self-pay | Admitting: Obstetrics and Gynecology

## 2025-01-21 ENCOUNTER — Ambulatory Visit: Admitting: Family Medicine

## 2025-02-05 ENCOUNTER — Ambulatory Visit: Payer: Self-pay | Admitting: Obstetrics and Gynecology
